# Patient Record
Sex: Female | Born: 1975
Health system: Southern US, Community
[De-identification: ages and names within clinical notes are randomized; demographics above are authoritative.]

## PROBLEM LIST (undated history)

## (undated) DIAGNOSIS — F502 Bulimia nervosa, unspecified: Secondary | ICD-10-CM

## (undated) DIAGNOSIS — F32A Depression, unspecified: Secondary | ICD-10-CM

## (undated) DIAGNOSIS — F329 Major depressive disorder, single episode, unspecified: Secondary | ICD-10-CM

---

## 2002-07-02 ENCOUNTER — Inpatient Hospital Stay (HOSPITAL_COMMUNITY): Admission: AD | Admit: 2002-07-02 | Discharge: 2002-07-02 | Payer: Self-pay | Admitting: Obstetrics and Gynecology

## 2003-01-05 ENCOUNTER — Emergency Department (HOSPITAL_COMMUNITY): Admission: EM | Admit: 2003-01-05 | Discharge: 2003-01-05 | Payer: Self-pay | Admitting: Emergency Medicine

## 2003-06-18 ENCOUNTER — Emergency Department (HOSPITAL_COMMUNITY): Admission: EM | Admit: 2003-06-18 | Discharge: 2003-06-18 | Payer: Self-pay | Admitting: Emergency Medicine

## 2003-12-10 ENCOUNTER — Emergency Department (HOSPITAL_COMMUNITY): Admission: EM | Admit: 2003-12-10 | Discharge: 2003-12-10 | Payer: Self-pay | Admitting: Emergency Medicine

## 2004-01-28 ENCOUNTER — Emergency Department (HOSPITAL_COMMUNITY): Admission: EM | Admit: 2004-01-28 | Discharge: 2004-01-28 | Payer: Self-pay | Admitting: Emergency Medicine

## 2004-07-14 ENCOUNTER — Emergency Department (HOSPITAL_COMMUNITY): Admission: EM | Admit: 2004-07-14 | Discharge: 2004-07-14 | Payer: Self-pay | Admitting: Emergency Medicine

## 2005-09-05 ENCOUNTER — Emergency Department (HOSPITAL_COMMUNITY): Admission: EM | Admit: 2005-09-05 | Discharge: 2005-09-05 | Payer: Self-pay | Admitting: Emergency Medicine

## 2006-03-27 ENCOUNTER — Emergency Department (HOSPITAL_COMMUNITY): Admission: EM | Admit: 2006-03-27 | Discharge: 2006-03-27 | Payer: Self-pay | Admitting: Family Medicine

## 2006-08-01 ENCOUNTER — Emergency Department (HOSPITAL_COMMUNITY): Admission: EM | Admit: 2006-08-01 | Discharge: 2006-08-01 | Payer: Self-pay | Admitting: Emergency Medicine

## 2007-06-27 ENCOUNTER — Emergency Department (HOSPITAL_COMMUNITY): Admission: EM | Admit: 2007-06-27 | Discharge: 2007-06-28 | Payer: Self-pay | Admitting: Emergency Medicine

## 2007-10-13 ENCOUNTER — Emergency Department (HOSPITAL_COMMUNITY): Admission: EM | Admit: 2007-10-13 | Discharge: 2007-10-13 | Payer: Self-pay | Admitting: Emergency Medicine

## 2008-01-17 ENCOUNTER — Emergency Department (HOSPITAL_COMMUNITY): Admission: EM | Admit: 2008-01-17 | Discharge: 2008-01-17 | Payer: Self-pay | Admitting: Family Medicine

## 2008-02-23 ENCOUNTER — Ambulatory Visit: Payer: Self-pay | Admitting: Internal Medicine

## 2008-02-27 ENCOUNTER — Ambulatory Visit: Payer: Self-pay | Admitting: Family Medicine

## 2008-04-06 ENCOUNTER — Emergency Department (HOSPITAL_COMMUNITY): Admission: EM | Admit: 2008-04-06 | Discharge: 2008-04-06 | Payer: Self-pay | Admitting: Emergency Medicine

## 2008-04-23 ENCOUNTER — Ambulatory Visit: Payer: Self-pay | Admitting: Internal Medicine

## 2008-05-01 ENCOUNTER — Emergency Department (HOSPITAL_COMMUNITY): Admission: EM | Admit: 2008-05-01 | Discharge: 2008-05-02 | Payer: Self-pay | Admitting: Emergency Medicine

## 2008-05-14 ENCOUNTER — Ambulatory Visit: Payer: Self-pay | Admitting: *Deleted

## 2008-08-15 ENCOUNTER — Ambulatory Visit: Payer: Self-pay | Admitting: Obstetrics & Gynecology

## 2008-09-14 ENCOUNTER — Emergency Department (HOSPITAL_COMMUNITY): Admission: EM | Admit: 2008-09-14 | Discharge: 2008-09-14 | Payer: Self-pay | Admitting: Emergency Medicine

## 2008-10-14 ENCOUNTER — Encounter (INDEPENDENT_AMBULATORY_CARE_PROVIDER_SITE_OTHER): Payer: Self-pay | Admitting: Adult Health

## 2008-10-14 ENCOUNTER — Ambulatory Visit: Payer: Self-pay | Admitting: Internal Medicine

## 2008-10-14 ENCOUNTER — Other Ambulatory Visit: Admission: RE | Admit: 2008-10-14 | Discharge: 2008-10-14 | Payer: Self-pay | Admitting: Internal Medicine

## 2008-10-14 LAB — CONVERTED CEMR LAB
AST: 55 units/L — ABNORMAL HIGH (ref 0–37)
BUN: 11 mg/dL (ref 6–23)
Basophils Relative: 0 % (ref 0–1)
Calcium: 9.1 mg/dL (ref 8.4–10.5)
Chlamydia, DNA Probe: NEGATIVE
Chloride: 106 meq/L (ref 96–112)
Creatinine, Ser: 0.75 mg/dL (ref 0.40–1.20)
GC Probe Amp, Genital: NEGATIVE
HCT: 41.8 % (ref 36.0–46.0)
Hemoglobin: 13.6 g/dL (ref 12.0–15.0)
MCHC: 32.5 g/dL (ref 30.0–36.0)
MCV: 91.1 fL (ref 78.0–100.0)
Monocytes Absolute: 0.8 10*3/uL (ref 0.1–1.0)
Monocytes Relative: 15 % — ABNORMAL HIGH (ref 3–12)
Neutro Abs: 2.9 10*3/uL (ref 1.7–7.7)
RBC: 4.59 M/uL (ref 3.87–5.11)

## 2008-10-15 ENCOUNTER — Encounter (INDEPENDENT_AMBULATORY_CARE_PROVIDER_SITE_OTHER): Payer: Self-pay | Admitting: Adult Health

## 2008-10-16 ENCOUNTER — Encounter (INDEPENDENT_AMBULATORY_CARE_PROVIDER_SITE_OTHER): Payer: Self-pay | Admitting: Adult Health

## 2008-10-16 LAB — CONVERTED CEMR LAB: HCV Ab: NEGATIVE

## 2008-12-04 ENCOUNTER — Emergency Department (HOSPITAL_COMMUNITY): Admission: EM | Admit: 2008-12-04 | Discharge: 2008-12-04 | Payer: Self-pay | Admitting: Emergency Medicine

## 2009-03-05 ENCOUNTER — Emergency Department (HOSPITAL_COMMUNITY): Admission: EM | Admit: 2009-03-05 | Discharge: 2009-03-06 | Payer: Self-pay | Admitting: Emergency Medicine

## 2009-05-06 ENCOUNTER — Emergency Department (HOSPITAL_COMMUNITY): Admission: EM | Admit: 2009-05-06 | Discharge: 2009-05-06 | Payer: Self-pay | Admitting: Emergency Medicine

## 2009-05-20 ENCOUNTER — Ambulatory Visit: Payer: Self-pay | Admitting: Internal Medicine

## 2009-07-23 ENCOUNTER — Emergency Department (HOSPITAL_COMMUNITY): Admission: EM | Admit: 2009-07-23 | Discharge: 2009-07-23 | Payer: Self-pay | Admitting: Family Medicine

## 2009-10-02 ENCOUNTER — Emergency Department (HOSPITAL_COMMUNITY): Admission: EM | Admit: 2009-10-02 | Discharge: 2009-10-02 | Payer: Self-pay | Admitting: Emergency Medicine

## 2009-11-07 ENCOUNTER — Inpatient Hospital Stay (HOSPITAL_COMMUNITY): Admission: AD | Admit: 2009-11-07 | Discharge: 2009-11-07 | Payer: Self-pay | Admitting: Obstetrics and Gynecology

## 2009-11-07 ENCOUNTER — Ambulatory Visit: Payer: Self-pay | Admitting: Advanced Practice Midwife

## 2010-01-08 ENCOUNTER — Emergency Department (HOSPITAL_COMMUNITY): Admission: EM | Admit: 2010-01-08 | Discharge: 2010-01-08 | Payer: Self-pay | Admitting: Family Medicine

## 2010-04-02 ENCOUNTER — Ambulatory Visit: Payer: Self-pay | Admitting: Advanced Practice Midwife

## 2010-04-02 ENCOUNTER — Inpatient Hospital Stay (HOSPITAL_COMMUNITY): Admission: AD | Admit: 2010-04-02 | Discharge: 2010-04-02 | Payer: Self-pay | Admitting: Obstetrics & Gynecology

## 2010-06-09 ENCOUNTER — Emergency Department (HOSPITAL_COMMUNITY): Admission: EM | Admit: 2010-06-09 | Discharge: 2010-06-09 | Payer: Self-pay | Admitting: Emergency Medicine

## 2010-09-18 ENCOUNTER — Emergency Department (HOSPITAL_COMMUNITY): Admission: EM | Admit: 2010-09-18 | Discharge: 2010-09-19 | Payer: Self-pay | Admitting: Emergency Medicine

## 2011-01-21 ENCOUNTER — Emergency Department: Payer: Self-pay | Admitting: Internal Medicine

## 2011-02-17 LAB — GC/CHLAMYDIA PROBE AMP, GENITAL
Chlamydia, DNA Probe: NEGATIVE
GC Probe Amp, Genital: NEGATIVE

## 2011-02-17 LAB — WET PREP, GENITAL

## 2011-02-18 LAB — URINALYSIS, ROUTINE W REFLEX MICROSCOPIC
Leukocytes, UA: NEGATIVE
Nitrite: NEGATIVE
Protein, ur: NEGATIVE mg/dL
Urobilinogen, UA: 1 mg/dL (ref 0.0–1.0)

## 2011-02-18 LAB — URINE MICROSCOPIC-ADD ON

## 2011-02-18 LAB — POCT PREGNANCY, URINE: Preg Test, Ur: NEGATIVE

## 2011-02-21 LAB — GC/CHLAMYDIA PROBE AMP, GENITAL
Chlamydia, DNA Probe: NEGATIVE
GC Probe Amp, Genital: NEGATIVE

## 2011-02-21 LAB — URINALYSIS, ROUTINE W REFLEX MICROSCOPIC
Bilirubin Urine: NEGATIVE
Glucose, UA: NEGATIVE mg/dL
Ketones, ur: NEGATIVE mg/dL
pH: 5.5 (ref 5.0–8.0)

## 2011-02-21 LAB — URINE MICROSCOPIC-ADD ON

## 2011-02-21 LAB — WET PREP, GENITAL
Trich, Wet Prep: NONE SEEN
WBC, Wet Prep HPF POC: NONE SEEN
Yeast Wet Prep HPF POC: NONE SEEN

## 2011-02-23 LAB — URINALYSIS, ROUTINE W REFLEX MICROSCOPIC
Glucose, UA: NEGATIVE mg/dL
Leukocytes, UA: NEGATIVE
Specific Gravity, Urine: >1.03 — ABNORMAL HIGH (ref 1.005–1.030)
Urobilinogen, UA: 0.2 mg/dL (ref 0.0–1.0)

## 2011-02-23 LAB — WET PREP, GENITAL: Yeast Wet Prep HPF POC: NONE SEEN

## 2011-02-23 LAB — URINE MICROSCOPIC-ADD ON

## 2011-02-24 LAB — WET PREP, GENITAL: Trich, Wet Prep: NONE SEEN

## 2011-02-24 LAB — POCT PREGNANCY, URINE: Preg Test, Ur: NEGATIVE

## 2011-03-09 LAB — WET PREP, GENITAL: Trich, Wet Prep: NONE SEEN

## 2011-03-09 LAB — GC/CHLAMYDIA PROBE AMP, GENITAL: GC Probe Amp, Genital: NEGATIVE

## 2011-03-09 LAB — POCT PREGNANCY, URINE: Preg Test, Ur: NEGATIVE

## 2011-03-11 LAB — WET PREP, GENITAL
Trich, Wet Prep: NONE SEEN
Yeast Wet Prep HPF POC: NONE SEEN

## 2011-03-11 LAB — GC/CHLAMYDIA PROBE AMP, GENITAL: Chlamydia, DNA Probe: NEGATIVE

## 2011-03-13 LAB — POCT URINALYSIS DIP (DEVICE)
Nitrite: NEGATIVE
Protein, ur: 30 mg/dL — AB
Urobilinogen, UA: 0.2 mg/dL (ref 0.0–1.0)
pH: 6 (ref 5.0–8.0)

## 2011-03-13 LAB — GC/CHLAMYDIA PROBE AMP, GENITAL: GC Probe Amp, Genital: NEGATIVE

## 2011-03-13 LAB — POCT PREGNANCY, URINE: Preg Test, Ur: NEGATIVE

## 2011-03-15 LAB — URINALYSIS, ROUTINE W REFLEX MICROSCOPIC
Leukocytes, UA: NEGATIVE
Nitrite: NEGATIVE
Specific Gravity, Urine: 1.03 (ref 1.005–1.030)
Urobilinogen, UA: 1 mg/dL (ref 0.0–1.0)
pH: 6 (ref 5.0–8.0)

## 2011-03-15 LAB — WET PREP, GENITAL
Trich, Wet Prep: NONE SEEN
WBC, Wet Prep HPF POC: NONE SEEN

## 2011-03-15 LAB — POCT PREGNANCY, URINE: Preg Test, Ur: NEGATIVE

## 2011-03-15 LAB — URINE MICROSCOPIC-ADD ON

## 2011-03-17 LAB — GC/CHLAMYDIA PROBE AMP, GENITAL
Chlamydia, DNA Probe: NEGATIVE
GC Probe Amp, Genital: NEGATIVE

## 2011-03-18 LAB — URINALYSIS, ROUTINE W REFLEX MICROSCOPIC
Bilirubin Urine: NEGATIVE
Ketones, ur: NEGATIVE mg/dL
Nitrite: NEGATIVE
Protein, ur: NEGATIVE mg/dL
Urobilinogen, UA: 1 mg/dL (ref 0.0–1.0)
pH: 7 (ref 5.0–8.0)

## 2011-03-18 LAB — WET PREP, GENITAL
Trich, Wet Prep: NONE SEEN
Yeast Wet Prep HPF POC: NONE SEEN

## 2011-03-18 LAB — PREGNANCY, URINE: Preg Test, Ur: NEGATIVE

## 2011-03-18 LAB — URINE MICROSCOPIC-ADD ON

## 2011-05-03 ENCOUNTER — Emergency Department (HOSPITAL_BASED_OUTPATIENT_CLINIC_OR_DEPARTMENT_OTHER)
Admission: EM | Admit: 2011-05-03 | Discharge: 2011-05-03 | Disposition: A | Discharge status: Home / Self Care | Payer: Self-pay | Attending: Emergency Medicine | Admitting: Emergency Medicine

## 2011-05-03 ENCOUNTER — Emergency Department (INDEPENDENT_AMBULATORY_CARE_PROVIDER_SITE_OTHER): Payer: Self-pay

## 2011-05-03 DIAGNOSIS — B9689 Other specified bacterial agents as the cause of diseases classified elsewhere: Secondary | ICD-10-CM | POA: Insufficient documentation

## 2011-05-03 DIAGNOSIS — R1013 Epigastric pain: Secondary | ICD-10-CM | POA: Insufficient documentation

## 2011-05-03 DIAGNOSIS — R109 Unspecified abdominal pain: Secondary | ICD-10-CM

## 2011-05-03 DIAGNOSIS — N760 Acute vaginitis: Secondary | ICD-10-CM | POA: Insufficient documentation

## 2011-05-03 DIAGNOSIS — A499 Bacterial infection, unspecified: Secondary | ICD-10-CM | POA: Insufficient documentation

## 2011-05-03 DIAGNOSIS — K2970 Gastritis, unspecified, without bleeding: Secondary | ICD-10-CM | POA: Insufficient documentation

## 2011-05-03 DIAGNOSIS — F319 Bipolar disorder, unspecified: Secondary | ICD-10-CM | POA: Insufficient documentation

## 2011-05-03 DIAGNOSIS — R1110 Vomiting, unspecified: Secondary | ICD-10-CM

## 2011-05-03 LAB — WET PREP, GENITAL
Trich, Wet Prep: NONE SEEN
Yeast Wet Prep HPF POC: NONE SEEN

## 2011-05-03 LAB — COMPREHENSIVE METABOLIC PANEL
ALT: 9 U/L (ref 0–35)
Alkaline Phosphatase: 49 U/L (ref 39–117)
CO2: 23 mEq/L (ref 19–32)
Chloride: 103 mEq/L (ref 96–112)
GFR calc non Af Amer: >60 mL/min (ref >60)
Glucose, Bld: 128 mg/dL — ABNORMAL HIGH (ref 70–99)
Potassium: 3.8 mEq/L (ref 3.5–5.1)
Sodium: 137 mEq/L (ref 135–145)

## 2011-05-03 LAB — CBC
MCH: 29.9 pg (ref 26.0–34.0)
MCV: 85.9 fL (ref 78.0–100.0)
Platelets: 333 10*3/uL (ref 150–400)
RDW: 12.1 % (ref 11.5–15.5)
WBC: 9.3 10*3/uL (ref 4.0–10.5)

## 2011-05-03 LAB — DIFFERENTIAL
Eosinophils Absolute: 0.1 10*3/uL (ref 0.0–0.7)
Eosinophils Relative: 1 % (ref 0–5)
Lymphs Abs: 1.4 10*3/uL (ref 0.7–4.0)

## 2011-05-03 LAB — URINALYSIS, ROUTINE W REFLEX MICROSCOPIC
Bilirubin Urine: NEGATIVE
Protein, ur: NEGATIVE mg/dL
Urobilinogen, UA: 0.2 mg/dL (ref 0.0–1.0)

## 2011-05-03 LAB — ETHANOL: Alcohol, Ethyl (B): 11 mg/dL — ABNORMAL HIGH (ref 0–10)

## 2011-05-03 LAB — LIPASE, BLOOD: Lipase: 18 U/L (ref 11–59)

## 2011-05-03 LAB — RAPID URINE DRUG SCREEN, HOSP PERFORMED: Benzodiazepines: NOT DETECTED

## 2011-07-10 ENCOUNTER — Inpatient Hospital Stay (INDEPENDENT_AMBULATORY_CARE_PROVIDER_SITE_OTHER)
Admission: RE | Admit: 2011-07-10 | Discharge: 2011-07-10 | Disposition: A | Discharge status: Home / Self Care | Payer: Self-pay | Source: Ambulatory Visit | Attending: Family Medicine | Admitting: Family Medicine

## 2011-07-10 DIAGNOSIS — S61209A Unspecified open wound of unspecified finger without damage to nail, initial encounter: Secondary | ICD-10-CM

## 2011-07-18 ENCOUNTER — Inpatient Hospital Stay (INDEPENDENT_AMBULATORY_CARE_PROVIDER_SITE_OTHER)
Admission: RE | Admit: 2011-07-18 | Discharge: 2011-07-18 | Disposition: A | Discharge status: Home / Self Care | Payer: Self-pay | Source: Ambulatory Visit | Attending: Family Medicine | Admitting: Family Medicine

## 2011-07-18 DIAGNOSIS — Z4802 Encounter for removal of sutures: Secondary | ICD-10-CM

## 2011-08-27 LAB — WET PREP, GENITAL: Clue Cells Wet Prep HPF POC: NONE SEEN

## 2011-08-27 LAB — POCT PREGNANCY, URINE: Operator id: 126491

## 2011-09-01 LAB — POCT I-STAT, CHEM 8
BUN: 10
Calcium, Ion: 1.24
Chloride: 103
Creatinine, Ser: 0.9
Glucose, Bld: 89
HCT: 40
Hemoglobin: 13.6
Potassium: 4
Sodium: 140
TCO2: 27

## 2011-09-01 LAB — RPR: RPR Ser Ql: NONREACTIVE

## 2011-09-01 LAB — WET PREP, GENITAL
Trich, Wet Prep: NONE SEEN
Yeast Wet Prep HPF POC: NONE SEEN

## 2011-09-01 LAB — URINALYSIS, ROUTINE W REFLEX MICROSCOPIC
Bilirubin Urine: NEGATIVE
Glucose, UA: NEGATIVE
Hgb urine dipstick: NEGATIVE
Ketones, ur: NEGATIVE
Nitrite: NEGATIVE
Protein, ur: NEGATIVE
Specific Gravity, Urine: 1.024
Urobilinogen, UA: 1
pH: 8

## 2011-09-01 LAB — GC/CHLAMYDIA PROBE AMP, GENITAL
Chlamydia, DNA Probe: NEGATIVE
GC Probe Amp, Genital: NEGATIVE

## 2011-09-01 LAB — PREGNANCY, URINE: Preg Test, Ur: NEGATIVE

## 2011-09-06 LAB — POCT URINALYSIS DIP (DEVICE)
Glucose, UA: NEGATIVE
Nitrite: NEGATIVE
Operator id: 282151
Protein, ur: NEGATIVE
Specific Gravity, Urine: >=1.03
Urobilinogen, UA: 0.2

## 2011-09-06 LAB — WET PREP, GENITAL
Trich, Wet Prep: NONE SEEN
Yeast Wet Prep HPF POC: NONE SEEN

## 2011-09-06 LAB — GC/CHLAMYDIA PROBE AMP, GENITAL
Chlamydia, DNA Probe: NEGATIVE
GC Probe Amp, Genital: NEGATIVE

## 2011-09-10 LAB — URINALYSIS, ROUTINE W REFLEX MICROSCOPIC
Leukocytes, UA: NEGATIVE
Protein, ur: NEGATIVE mg/dL
Specific Gravity, Urine: 1.022 (ref 1.005–1.030)
Urobilinogen, UA: 1 mg/dL (ref 0.0–1.0)

## 2011-09-10 LAB — URINE MICROSCOPIC-ADD ON

## 2011-09-10 LAB — WET PREP, GENITAL: Trich, Wet Prep: NONE SEEN

## 2011-09-16 ENCOUNTER — Inpatient Hospital Stay (INDEPENDENT_AMBULATORY_CARE_PROVIDER_SITE_OTHER)
Admission: RE | Admit: 2011-09-16 | Discharge: 2011-09-16 | Disposition: A | Discharge status: Home / Self Care | Payer: Self-pay | Source: Ambulatory Visit | Attending: Emergency Medicine | Admitting: Emergency Medicine

## 2011-09-16 DIAGNOSIS — N760 Acute vaginitis: Secondary | ICD-10-CM

## 2011-09-16 DIAGNOSIS — A499 Bacterial infection, unspecified: Secondary | ICD-10-CM

## 2011-09-16 LAB — WET PREP, GENITAL
Trich, Wet Prep: NONE SEEN
Yeast Wet Prep HPF POC: NONE SEEN

## 2011-09-16 LAB — POCT URINALYSIS DIP (DEVICE)
Bilirubin Urine: NEGATIVE
Glucose, UA: NEGATIVE mg/dL
Ketones, ur: NEGATIVE mg/dL
Leukocytes, UA: NEGATIVE
Nitrite: NEGATIVE

## 2011-09-16 LAB — POCT PREGNANCY, URINE: Preg Test, Ur: NEGATIVE

## 2011-09-17 LAB — GC/CHLAMYDIA PROBE AMP, GENITAL: Chlamydia, DNA Probe: NEGATIVE

## 2011-09-20 LAB — WET PREP, GENITAL
Clue Cells Wet Prep HPF POC: NONE SEEN
Trich, Wet Prep: NONE SEEN
Yeast Wet Prep HPF POC: NONE SEEN

## 2011-09-20 LAB — URINALYSIS, ROUTINE W REFLEX MICROSCOPIC
Bilirubin Urine: NEGATIVE
Glucose, UA: NEGATIVE
Hgb urine dipstick: NEGATIVE
Specific Gravity, Urine: 1.025
Urobilinogen, UA: 1
pH: 7

## 2011-09-20 LAB — GC/CHLAMYDIA PROBE AMP, GENITAL: GC Probe Amp, Genital: NEGATIVE

## 2011-12-19 ENCOUNTER — Encounter (HOSPITAL_COMMUNITY): Payer: Self-pay | Admitting: Emergency Medicine

## 2011-12-19 ENCOUNTER — Emergency Department (HOSPITAL_COMMUNITY)
Admission: EM | Admit: 2011-12-19 | Discharge: 2011-12-20 | Disposition: A | Discharge status: Home / Self Care | Payer: Self-pay | Attending: Emergency Medicine | Admitting: Emergency Medicine

## 2011-12-19 DIAGNOSIS — F3289 Other specified depressive episodes: Secondary | ICD-10-CM | POA: Insufficient documentation

## 2011-12-19 DIAGNOSIS — F502 Bulimia nervosa, unspecified: Secondary | ICD-10-CM | POA: Insufficient documentation

## 2011-12-19 DIAGNOSIS — F329 Major depressive disorder, single episode, unspecified: Secondary | ICD-10-CM

## 2011-12-19 LAB — POCT I-STAT, CHEM 8
Calcium, Ion: 1.26 mmol/L (ref 1.12–1.32)
Chloride: 107 mEq/L (ref 96–112)
Glucose, Bld: 83 mg/dL (ref 70–99)
HCT: 41 % (ref 36.0–46.0)
TCO2: 26 mmol/L (ref 0–100)

## 2011-12-19 NOTE — ED Notes (Signed)
C/o depression over the past "few months."  Pt reports she is having personal problems.  Denies suicidal ideation.

## 2011-12-19 NOTE — ED Notes (Signed)
Pt presents to department for evaluation of depression. States that she has felt very sad and upset lately. States she has a drug/alcohol problem, uses cocaine, marijuana and drinks heavily. She has currently been having trouble keeping a job and paying bills. Denies SI/HI at the time. Pt states she is very stressed out and wants to get help getting her life together. Pt is alert and oriented x4. Last drug/alcohol use today. Denies pain. No signs of distress noted at present.

## 2011-12-19 NOTE — ED Provider Notes (Signed)
Patient depressed for several months over lack of job and lack of money she has been practicing bulimia for several years she vehemently denies suicidal ideation or intention to harm others  Doug Sou, MD 12/19/11 2052

## 2011-12-19 NOTE — ED Provider Notes (Signed)
History     CSN: [id number]  Arrival date & time 12/19/11  1735   First MD Initiated Contact with Patient 12/19/11 1943      Chief Complaint  Patient presents with  . Depression    (Consider location/radiation/quality/duration/timing/severity/associated sxs/prior treatment) HPI history from patient Patient is a 36 year old female with history of depression and bulimia who presents with depression. Patient states she has had depression for years but has had increasing depression over the last 5 months. She is sad but denies sleep issues, anhedonia, difficulty concentrating. She is mostly frustrated regarding her difficulty finding a stable job. She also has some additional social and family stressors. She has had a few episodes of binging and purging over the last 5 months but overall is eating and drinking well. Patient adamantly denies suicidal ideation/attempt, homicidal ideation/attempt, and hallucinations. She has never attempted to hurt himself before. She is here seeking help with resources for therapy as well as education and vocational training. Overall severity is mild. No modifying factors noted.  History reviewed. No pertinent past medical history.  History reviewed. No pertinent past surgical history.  History reviewed. No pertinent family history.  History  Substance Use Topics  . Smoking status: Current Everyday Smoker  . Smokeless tobacco: Not on file  . Alcohol Use: Yes    OB History    Grav Para Term Preterm Abortions TAB SAB Ect Mult Living                  Review of Systems  Constitutional: Negative for fever and chills.  HENT: Negative for facial swelling.   Eyes: Negative for visual disturbance.  Respiratory: Negative for cough, chest tightness, shortness of breath and wheezing.   Cardiovascular: Negative for chest pain.  Gastrointestinal: Negative for nausea, vomiting, abdominal pain and diarrhea.  Genitourinary: Negative for difficulty urinating.    Skin: Negative for rash.  Neurological: Negative for weakness and numbness.  Psychiatric/Behavioral: Negative for behavioral problems and confusion.  All other systems reviewed and are negative.    Allergies  Tetracyclines & related  Home Medications   Current Outpatient Rx  Name Route Sig Dispense Refill  . TERBINAFINE HCL 1 % EX CREA Topical Apply 1 application topically 2 (two) times daily. For dryness      BP 93/68  Pulse 70  Temp(Src) 98.8 F (37.1 C) (Oral)  Resp 16  Ht 5' 4.5" (1.638 m)  Wt 148 lb (67.132 kg)  BMI 25.01 kg/m2  SpO2 98%  LMP 11/28/2011  Physical Exam  Nursing note and vitals reviewed. Constitutional: She is oriented to person, place, and time. She appears well-developed and well-nourished. No distress.  HENT:  Head: Normocephalic.  Nose: Nose normal.  Eyes: EOM are normal.  Neck: Normal range of motion. Neck supple.  Cardiovascular: Normal rate, regular rhythm and intact distal pulses.   Pulmonary/Chest: Effort normal and breath sounds normal. No respiratory distress.  Abdominal: Soft. She exhibits no distension.  Musculoskeletal: Normal range of motion. She exhibits no edema and no tenderness.  Neurological: She is alert and oriented to person, place, and time.       Normal strength  Skin: Skin is warm and dry. No rash noted. She is not diaphoretic.  Psychiatric: She has a normal mood and affect. Her behavior is normal. Thought content normal.       Mood and affect are normal. Patient has appropriate conversation. She denies SI/HI and hallucinations    ED Course  Procedures (including critical care time)  Labs Reviewed  POCT I-STAT, CHEM 8  I-STAT, CHEM 8   No results found.   1. Depression       MDM   With reported history of bulimia, metabolic panel ordered and is unremarkable. Patient is here seeking assistance with resources and therapy. She has good insight and is being proactive. She has not had suicidal ideation or  hallucinations. She is not a harm herself or others. She is acting appropriately. I discussed her situation with social work as well as the ACT team. We were able to compile local resources for assistance with education as well as therapy. Patient amenable to plan and return precautions discussed.        Milus Glazier 12/20/11 0121

## 2011-12-20 NOTE — ED Provider Notes (Signed)
I have personally seen and examined the patient.  I have discussed the plan of care with the resident.  I have reviewed the documentation on PMH/FH/Soc. History.  I have reviewed the documentation of the resident and agree.  Edward Trevino, MD 12/20/11 1401 

## 2012-01-07 ENCOUNTER — Emergency Department (HOSPITAL_BASED_OUTPATIENT_CLINIC_OR_DEPARTMENT_OTHER)
Admission: EM | Admit: 2012-01-07 | Discharge: 2012-01-07 | Disposition: A | Discharge status: Home / Self Care | Payer: Self-pay | Attending: Emergency Medicine | Admitting: Emergency Medicine

## 2012-01-07 ENCOUNTER — Encounter (HOSPITAL_BASED_OUTPATIENT_CLINIC_OR_DEPARTMENT_OTHER): Payer: Self-pay | Admitting: Emergency Medicine

## 2012-01-07 DIAGNOSIS — L600 Ingrowing nail: Secondary | ICD-10-CM | POA: Insufficient documentation

## 2012-01-07 DIAGNOSIS — M79609 Pain in unspecified limb: Secondary | ICD-10-CM | POA: Insufficient documentation

## 2012-01-07 DIAGNOSIS — M7989 Other specified soft tissue disorders: Secondary | ICD-10-CM | POA: Insufficient documentation

## 2012-01-07 DIAGNOSIS — F329 Major depressive disorder, single episode, unspecified: Secondary | ICD-10-CM | POA: Insufficient documentation

## 2012-01-07 DIAGNOSIS — F17200 Nicotine dependence, unspecified, uncomplicated: Secondary | ICD-10-CM | POA: Insufficient documentation

## 2012-01-07 DIAGNOSIS — F3289 Other specified depressive episodes: Secondary | ICD-10-CM | POA: Insufficient documentation

## 2012-01-07 HISTORY — DX: Bulimia nervosa, unspecified: F50.20

## 2012-01-07 HISTORY — DX: Bulimia nervosa: F50.2

## 2012-01-07 HISTORY — DX: Depression, unspecified: F32.A

## 2012-01-07 HISTORY — DX: Major depressive disorder, single episode, unspecified: F32.9

## 2012-01-07 MED ORDER — BUPIVACAINE HCL 0.5 % IJ SOLN
INTRAMUSCULAR | Status: AC
  Filled 2012-01-07: qty 1

## 2012-01-07 MED ORDER — BUPIVACAINE HCL 0.5 % IJ SOLN
50.0000 mL | Freq: Once | INTRAMUSCULAR | Status: AC
  Administered 2012-01-07: 5 mL

## 2012-01-07 NOTE — ED Provider Notes (Signed)
History     CSN: [id number]  Arrival date & time 01/07/12  0343   First MD Initiated Contact with Patient 01/07/12 [phone]      Chief Complaint  Patient presents with  . Toe Pain     The history is provided by the patient.   the patient reports pain and swelling to her right great toe for several days.  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  She reports she feels as though she may have cut her nail too short and believes she has an ingrown toenail on the right great toe.  No fever or chills.  No spreading erythema.  She reports that pain is moderate to severe.  It is worsened by palpation and movement.  It is improved by nothing.  She has no significant past medical history.  She is nondiabetic.  Past Medical History  Diagnosis Date  . Depression   . Bulimia     History reviewed. No pertinent past surgical history.  No family history on file.  History  Substance Use Topics  . Smoking status: Current Some Day Smoker  . Smokeless tobacco: Not on file  . Alcohol Use: Yes    OB History    Grav Para Term Preterm Abortions TAB SAB Ect Mult Living                  Review of Systems  All other systems reviewed and are negative.    Allergies  Tetracyclines & related  Home Medications   Current Outpatient Rx  Name Route Sig Dispense Refill  . TERBINAFINE HCL 1 % EX CREA Topical Apply 1 application topically 2 (two) times daily. For dryness      BP 112/72  Pulse 78  Temp(Src) 98.2 F (36.8 C) (Oral)  Resp 18  SpO2 99%  LMP 11/28/2011  Physical Exam  Constitutional: She is oriented to person, place, and time. She appears well-developed and well-nourished.  HENT:  Head: Normocephalic.  Eyes: EOM are normal.  Neck: Normal range of motion.  Pulmonary/Chest: Effort normal.  Musculoskeletal: Normal range of motion.       Ingrown toenail on right great toe on medial aspect.  No drainage or pus from the  wound.  There is no swelling or spreading erythema  Neurological: She is alert and oriented to person, place, and time.  Psychiatric: She has a normal mood and affect.    ED Course  NERVE BLOCK Performed by: Lyanne Co Authorized by: Lyanne Co Risks and benefits: risks, benefits and alternatives were discussed Consent given by: patient Required items: required blood products, implants, devices, and special equipment available Patient identity confirmed: verbally with patient Time out: Immediately prior to procedure a "time out" was called to verify the correct patient, procedure, equipment, support staff and site/side marked as required. Indications comments: Toenail procedure.  Nail excision Nerve block body site: Digital nerves of right great toe. Preparation: Patient was prepped and draped in the usual sterile fashion. Needle gauge: 24 G Location technique: anatomical landmarks Local anesthetic: bupivacaine 0.5% without epinephrine Anesthetic total: 4 ml Outcome: pain improved Patient tolerance: Patient tolerated the procedure well with no immediate complications.  NAIL REMOVAL Performed by: Lyanne Co Authorized by: Lyanne Co Consent: Verbal consent obtained. Risks and benefits: risks, benefits and alternatives were discussed Consent given by: patient Required items: required blood products, implants, devices, and special equipment available Patient identity  confirmed: verbally with patient Time out: Immediately prior to procedure a "time out" was called to verify the correct patient, procedure, equipment, support staff and site/side marked as required. Location: right foot Location details: right big toe Anesthesia: nerve block Preparation: skin prepped with Betadine Amount removed: 1/5 Nail removed location: Medial. Dressing: antibiotic ointment and dressing applied Patient tolerance: Patient tolerated the procedure well with no immediate  complications.   (including critical care time)  Labs Reviewed - No data to display No results found.   1. Ingrown toenail       MDM  Toenail procedure performed.  Partial nail excision performed.  Infection warnings given.  Instructed in warm dial soap soaks       Lyanne Co, MD 01/07/12 [phone]

## 2012-01-07 NOTE — ED Notes (Signed)
Pt c/o right great toe pain and swelling after giving self a pedicure

## 2012-02-18 ENCOUNTER — Emergency Department (HOSPITAL_COMMUNITY)
Admission: EM | Admit: 2012-02-18 | Discharge: 2012-02-18 | Disposition: A | Discharge status: Home / Self Care | Payer: Self-pay | Attending: Emergency Medicine | Admitting: Emergency Medicine

## 2012-02-18 ENCOUNTER — Encounter (HOSPITAL_COMMUNITY): Payer: Self-pay | Admitting: Emergency Medicine

## 2012-02-18 DIAGNOSIS — F17200 Nicotine dependence, unspecified, uncomplicated: Secondary | ICD-10-CM | POA: Insufficient documentation

## 2012-02-18 DIAGNOSIS — N6019 Diffuse cystic mastopathy of unspecified breast: Secondary | ICD-10-CM | POA: Insufficient documentation

## 2012-02-18 DIAGNOSIS — N949 Unspecified condition associated with female genital organs and menstrual cycle: Secondary | ICD-10-CM | POA: Insufficient documentation

## 2012-02-18 DIAGNOSIS — L293 Anogenital pruritus, unspecified: Secondary | ICD-10-CM | POA: Insufficient documentation

## 2012-02-18 DIAGNOSIS — N898 Other specified noninflammatory disorders of vagina: Secondary | ICD-10-CM

## 2012-02-18 LAB — URINALYSIS, ROUTINE W REFLEX MICROSCOPIC
Bilirubin Urine: NEGATIVE
Glucose, UA: NEGATIVE mg/dL
Ketones, ur: NEGATIVE mg/dL
Nitrite: NEGATIVE
Protein, ur: NEGATIVE mg/dL
pH: 6 (ref 5.0–8.0)

## 2012-02-18 LAB — URINE MICROSCOPIC-ADD ON

## 2012-02-18 LAB — WET PREP, GENITAL
Trich, Wet Prep: NONE SEEN
Yeast Wet Prep HPF POC: NONE SEEN

## 2012-02-18 MED ORDER — METRONIDAZOLE 500 MG PO TABS: 500.0000 mg | ORAL_TABLET | Freq: Two times a day (BID) | ORAL | Status: AC

## 2012-02-18 NOTE — ED Notes (Signed)
Patient moved to CDU for pelvic exam

## 2012-02-18 NOTE — Discharge Instructions (Signed)
Call the on-call gynecologist for a checkup.  Return here for problems as needed.

## 2012-02-18 NOTE — ED Notes (Signed)
Patient now back to original room

## 2012-02-18 NOTE — ED Provider Notes (Addendum)
History     CSN: [id number]  Arrival date & time 02/18/12  1132   First MD Initiated Contact with Patient 02/18/12 1318      Chief Complaint  Patient presents with  . Vaginal Itching    (Consider location/radiation/quality/duration/timing/severity/associated sxs/prior treatment) Patient is a 36 y.o. female presenting with vaginal itching. The history is provided by the patient.  Vaginal Itching The current episode started more than 1 week ago. The problem occurs constantly. The problem has not changed since onset.Pertinent negatives include no chest pain and no abdominal pain. Associated symptoms comments: Her left breast has been swollen for 2 months. No nipple discharge is associated. She has not palpated any new masses. The symptoms are aggravated by nothing. The symptoms are relieved by nothing. Treatments tried: She tried using Lamisil without relief. The treatment provided no relief.   Her Dr. told her that she had fibrocystic breast disease. Her vaginal itching is associated with a mild vaginal discharge, but no dysparunia. She denies vomiting, diarrhea, abdominal pain or back pain.    Past Medical History  Diagnosis Date  . Depression   . Bulimia     History reviewed. No pertinent past surgical history.  No family history on file.  History  Substance Use Topics  . Smoking status: Current Some Day Smoker  . Smokeless tobacco: Not on file  . Alcohol Use: Yes    OB History    Grav Para Term Preterm Abortions TAB SAB Ect Mult Living                  Review of Systems  Cardiovascular: Negative for chest pain.  Gastrointestinal: Negative for abdominal pain.  All other systems reviewed and are negative.    Allergies  Tetracyclines & related  Home Medications   Current Outpatient Rx  Name Route Sig Dispense Refill  . ACETAMINOPHEN 500 MG PO TABS Oral Take 500 mg by mouth every 6 (six) hours as needed. For pain    . TERBINAFINE HCL 1 % EX CREA Topical Apply  1 application topically 2 (two) times daily as needed. For dryness      BP 95/65  Pulse 67  Temp(Src) 98 F (36.7 C) (Oral)  Resp 14  SpO2 100%  Physical Exam  Nursing note and vitals reviewed. Constitutional: She is oriented to person, place, and time. She appears well-developed and well-nourished.  HENT:  Head: Normocephalic and atraumatic.  Eyes: Conjunctivae and EOM are normal. Pupils are equal, round, and reactive to light.  Neck: Normal range of motion and phonation normal. Neck supple.  Cardiovascular: Normal rate, regular rhythm and intact distal pulses.   Pulmonary/Chest: Effort normal and breath sounds normal. She exhibits no tenderness.       The breast are pendulous bilaterally. There is no substantial volume difference. She has scattered small nodules consistent with with fibrocystic breast disease. The nipples are normal. There is no associated axillary adenopathy  Abdominal: Soft. She exhibits no distension. There is no tenderness. There is no guarding.  Genitourinary:       Normal external female genitalia. Mild, translucent, vaginal discharge. The cervix is in the left lateral vaginal wall. On bimanual examination:  She has left Adenexal tenderness (mild), without mass. The uterus is palpated to the left of the midline; it has normal size, and nontender.  Musculoskeletal: Normal range of motion.  Neurological: She is alert and oriented to person, place, and time. She has normal strength. She exhibits normal muscle tone.  Skin:  Skin is warm and dry.  Psychiatric: She has a normal mood and affect. Her behavior is normal. Judgment and thought content normal.    ED Course  Procedures (including critical care time)   3:15 PM Reevaluation with update and discussion. After initial assessment and treatment, an updated evaluation reveals no further c/o. Jarrid Lienhard L    Labs Reviewed  URINALYSIS, ROUTINE W REFLEX MICROSCOPIC - Abnormal; Notable for the following:     APPearance CLOUDY (*)    Hgb urine dipstick SMALL (*)    All other components within normal limits  URINE MICROSCOPIC-ADD ON - Abnormal; Notable for the following:    Squamous Epithelial / LPF MANY (*)    Bacteria, UA FEW (*)    All other components within normal limits  WET PREP, GENITAL - Abnormal; Notable for the following:    WBC, Wet Prep HPF POC FEW (*)    All other components within normal limits  POCT PREGNANCY, URINE  GC/CHLAMYDIA PROBE AMP, GENITAL   No results found.   1. Vaginal odor       MDM  She has a benign breast examination. I doubt associated tumors, infection, or structural abnormality. The pelvic examination is abnormal- but ovarian and uterine abnormalities are not suspected. She will likely need assessment by a gynecologist do to the abnormally placed. The cervix and uterus, to ensure no occult process. The vaginal odor she is having has no obvious source.  Plan: Home Medications- none; Home Treatments- none; Recommended follow up- f/u GYN prn        Flint Melter, MD 02/18/12 1515   After d/c, pt decided that she wanted a prescription for Flagyl. I agreed to write it.  Flint Melter, MD 02/18/12 [phone]

## 2012-02-18 NOTE — ED Notes (Signed)
Patient presents with vaginal itching x several weeks with mild discharge.  Patient reports she was treated for similar symptoms a few weeks ago, lost her medication that was prescribed (metronidazole), therefore her infection has not cleared.  Patient resting quietly, denies abdominal pain at this time.

## 2012-02-18 NOTE — ED Notes (Signed)
Stats has had avg itching and a vag d/c x  2 weeks

## 2012-06-30 ENCOUNTER — Emergency Department (HOSPITAL_COMMUNITY)
Admission: EM | Admit: 2012-06-30 | Discharge: 2012-06-30 | Disposition: A | Discharge status: Home / Self Care | Payer: Self-pay | Attending: Emergency Medicine | Admitting: Emergency Medicine

## 2012-06-30 ENCOUNTER — Encounter (HOSPITAL_COMMUNITY): Payer: Self-pay | Admitting: Emergency Medicine

## 2012-06-30 DIAGNOSIS — K089 Disorder of teeth and supporting structures, unspecified: Secondary | ICD-10-CM | POA: Insufficient documentation

## 2012-06-30 DIAGNOSIS — R632 Polyphagia: Secondary | ICD-10-CM | POA: Insufficient documentation

## 2012-06-30 DIAGNOSIS — F329 Major depressive disorder, single episode, unspecified: Secondary | ICD-10-CM | POA: Insufficient documentation

## 2012-06-30 DIAGNOSIS — F17200 Nicotine dependence, unspecified, uncomplicated: Secondary | ICD-10-CM | POA: Insufficient documentation

## 2012-06-30 DIAGNOSIS — F3289 Other specified depressive episodes: Secondary | ICD-10-CM | POA: Insufficient documentation

## 2012-06-30 DIAGNOSIS — K0889 Other specified disorders of teeth and supporting structures: Secondary | ICD-10-CM

## 2012-06-30 MED ORDER — PENICILLIN V POTASSIUM 500 MG PO TABS: 500.0000 mg | ORAL_TABLET | Freq: Four times a day (QID) | ORAL | Status: AC

## 2012-06-30 MED ORDER — HYDROCODONE-ACETAMINOPHEN 5-500 MG PO TABS: 1.0000 | ORAL_TABLET | Freq: Four times a day (QID) | ORAL | Status: AC | PRN

## 2012-06-30 NOTE — ED Provider Notes (Signed)
History     CSN: [id number]  Arrival date & time 06/30/12  0746   First MD Initiated Contact with Patient 06/30/12 [phone]      Chief Complaint  Patient presents with  . Dental Pain     The history is provided by the patient.   the patient reports approximately 3 weeks of left upper dental pain.  The last several days she's developed pain in her left lower first molars well.  She denies difficulty breathing or swallowing.  She has no fevers or chills.  She denies facial swelling.  She has no new lymphadenopathy.  Her pain is worsened by palpation of her tooth.  Nothing improves her pain.  She has not called her dentist for followup  Past Medical History  Diagnosis Date  . Depression   . Bulimia     History reviewed. No pertinent past surgical history.  No family history on file.  History  Substance Use Topics  . Smoking status: Current Some Day Smoker  . Smokeless tobacco: Not on file  . Alcohol Use: Yes    OB History    Grav Para Term Preterm Abortions TAB SAB Ect Mult Living                  Review of Systems  All other systems reviewed and are negative.    Allergies  Tetracyclines & related  Home Medications   Current Outpatient Rx  Name Route Sig Dispense Refill  . METRONIDAZOLE 500 MG PO TABS Oral Take 1,000 mg by mouth 2 (two) times daily.    Marland Kitchen OVER THE COUNTER MEDICATION Oral Take 1 tablet by mouth at bedtime. OTC sleeping pill    . TERBINAFINE HCL 1 % EX CREA Topical Apply 1 application topically 2 (two) times daily as needed. For dryness    . HYDROCODONE-ACETAMINOPHEN 5-500 MG PO TABS Oral Take 1-2 tablets by mouth every 6 (six) hours as needed for pain. 15 tablet 0  . PENICILLIN V POTASSIUM 500 MG PO TABS Oral Take 1 tablet (500 mg total) by mouth 4 (four) times daily. 28 tablet 0    BP 103/73  Pulse 75  Temp 98.7 F (37.1 C) (Oral)  Resp 20  SpO2 96%  LMP 06/16/2012  Physical Exam  Nursing note and vitals reviewed. Constitutional: She is  oriented to person, place, and time. She appears well-developed and well-nourished. No distress.  HENT:  Head: Normocephalic and atraumatic.       Patient with evidence of dental decay at her left upper second molar as well as her left lower first molar.  No gingival swelling.  No gingival fluctuance.  Tolerating secretions  Eyes: EOM are normal.  Neck: Normal range of motion.  Cardiovascular: Normal heart sounds.   Pulmonary/Chest: Effort normal.  Abdominal: Soft.  Musculoskeletal: Normal range of motion.  Neurological: She is alert and oriented to person, place, and time.  Skin: Skin is warm and dry.  Psychiatric: She has a normal mood and affect. Judgment normal.    ED Course  Procedures (including critical care time)  Labs Reviewed - No data to display No results found.   1. Pain, dental       MDM  Dental Pain. Home with antibiotics and pain medicine. Recommend dental follow up. No signs of gingival abscess. Tolerating secretions. Airway patent. No sub lingular swelling         Lyanne Co, MD 06/30/12 [phone]

## 2012-06-30 NOTE — ED Notes (Addendum)
Pt c/o left upper toothache x 2-3 weeks. Pt reports cannot afford to go to the dentist. Pt tried Orajel over the counter without relief.

## 2012-11-12 ENCOUNTER — Encounter (HOSPITAL_BASED_OUTPATIENT_CLINIC_OR_DEPARTMENT_OTHER): Payer: Self-pay | Admitting: *Deleted

## 2012-11-12 ENCOUNTER — Emergency Department (HOSPITAL_BASED_OUTPATIENT_CLINIC_OR_DEPARTMENT_OTHER)
Admission: EM | Admit: 2012-11-12 | Discharge: 2012-11-12 | Disposition: A | Discharge status: Home / Self Care | Payer: Self-pay | Attending: Emergency Medicine | Admitting: Emergency Medicine

## 2012-11-12 DIAGNOSIS — L600 Ingrowing nail: Secondary | ICD-10-CM | POA: Insufficient documentation

## 2012-11-12 DIAGNOSIS — Z8659 Personal history of other mental and behavioral disorders: Secondary | ICD-10-CM | POA: Insufficient documentation

## 2012-11-12 DIAGNOSIS — Z87891 Personal history of nicotine dependence: Secondary | ICD-10-CM | POA: Insufficient documentation

## 2012-11-12 DIAGNOSIS — Z79899 Other long term (current) drug therapy: Secondary | ICD-10-CM | POA: Insufficient documentation

## 2012-11-12 DIAGNOSIS — Z87828 Personal history of other (healed) physical injury and trauma: Secondary | ICD-10-CM | POA: Insufficient documentation

## 2012-11-12 MED ORDER — LIDOCAINE HCL 2 % IJ SOLN
INTRAMUSCULAR | Status: AC
  Administered 2012-11-12: 400 mg
  Filled 2012-11-12: qty 20

## 2012-11-12 MED ORDER — IBUPROFEN 400 MG PO TABS: 400.0000 mg | ORAL_TABLET | Freq: Four times a day (QID) | ORAL | Status: DC | PRN

## 2012-11-12 NOTE — ED Notes (Signed)
In grown toenail has been soaking is red and painful

## 2012-11-12 NOTE — ED Provider Notes (Signed)
History  This chart was scribed for Tanya Racer, MD by Bennett Scrape, ED Scribe. This patient was seen in room MH07/[facility]07 and the patient's care was started at 4:42 PM.  CSN: [id number]  Arrival date & time 11/12/12  1442   First MD Initiated Contact with Patient 11/12/12 1642      Chief Complaint  Patient presents with  . Toe Pain    Patient is a 36 y.o. female presenting with toe pain. The history is provided by the patient. No language interpreter was used.  Toe Pain This is a new problem. The current episode started more than 1 week ago. The problem occurs constantly. The problem has been gradually worsening. Pertinent negatives include no chest pain, no abdominal pain, no headaches and no shortness of breath. The symptoms are aggravated by walking. Nothing relieves the symptoms.    Tanya Kirk is a 36 y.o. female who presents to the Emergency Department complaining of 3 weeks of gradual onset, gradually worsening, constant in grown toe nail in the right big toe with associated redness. The pain is worse with walking and she states that she has been soaking her toe every day with mild improvement. She reports one prior episode of an in grown toe nail with similar symptoms and states that she dropped a large object on her toe years ago which makes the nail grow sideways. She denies fevers, chills, nausea and emesis as associated symptoms. She has a h/o depression and bulimia. Pt is an occasional alcohol user and is a former smoker.   Past Medical History  Diagnosis Date  . Depression   . Bulimia     History reviewed. No pertinent past surgical history.  History reviewed. No pertinent family history.  History  Substance Use Topics  . Smoking status: Former Games developer  . Smokeless tobacco: Not on file  . Alcohol Use: Yes     Comment: occ    No OB history provided.  Review of Systems  Constitutional: Negative for fever and chills.  Respiratory: Negative for shortness of  breath.   Cardiovascular: Negative for chest pain.  Gastrointestinal: Negative for nausea, vomiting and abdominal pain.  Musculoskeletal:       Positive for right great toe pain  Neurological: Negative for headaches.  All other systems reviewed and are negative.    Allergies  Tetracyclines & related  Home Medications   Current Outpatient Rx  Name  Route  Sig  Dispense  Refill  . IBUPROFEN 400 MG PO TABS   Oral   Take 1 tablet (400 mg total) by mouth every 6 (six) hours as needed for pain.   30 tablet   0   . METRONIDAZOLE 500 MG PO TABS   Oral   Take 1,000 mg by mouth 2 (two) times daily.         Marland Kitchen OVER THE COUNTER MEDICATION   Oral   Take 1 tablet by mouth at bedtime. OTC sleeping pill         . TERBINAFINE HCL 1 % EX CREA   Topical   Apply 1 application topically 2 (two) times daily as needed. For dryness           Triage Vitals: BP 100/67  Pulse 73  Temp 98.3 F (36.8 C) (Oral)  Resp 20  SpO2 100%  LMP 11/11/2012  Physical Exam  Nursing note and vitals reviewed. Constitutional: She is oriented to person, place, and time. She appears well-developed and well-nourished. No distress.  HENT:  Head: Normocephalic and atraumatic.  Eyes: EOM are normal.  Neck: Neck supple. No tracheal deviation present.  Cardiovascular: Normal rate.   Pulmonary/Chest: Effort normal. No respiratory distress.  Musculoskeletal: Normal range of motion.       Mild erythema to the right great toe along the medial border of the free edge  Neurological: She is alert and oriented to person, place, and time.  Skin: Skin is warm and dry.  Psychiatric: She has a normal mood and affect. Her behavior is normal.    ED Course  Procedures (including critical care time)  DIAGNOSTIC STUDIES: Oxygen Saturation is 100% on room air, normal by my interpretation.    COORDINATION OF CARE: 4:50 PM- Discussed treatment plan which includes cutting the toe nail with pt at bedside and pt agreed  to plan. Advised pt on toe nail cutting techniques and that she should follow up with a podiatrist.  5:17 PM- Prepped pt's right great toe with an alcohol wipe. Injected 2% lidocaine <1 mL into the right great toe, made incision on the distal lateral great toe nail, no bleeding, pt tolerated well  Labs Reviewed - No data to display No results found.   1. Ingrown nail       MDM  I personally performed the services described in this documentation, which was scribed in my presence. The recorded information has been reviewed and is accurate.     Tanya Racer, MD 11/12/12 [phone]

## 2013-01-12 IMAGING — CR DG ABDOMEN ACUTE W/ 1V CHEST
3 series · 3 of 3 positions shown · non-contrast
Comparison: None.

CLINICAL DATA: Abdominal pain, vomiting

ACUTE ABDOMEN SERIES (ABDOMEN 2 VIEW & CHEST 1 VIEW)

[w chest pa]
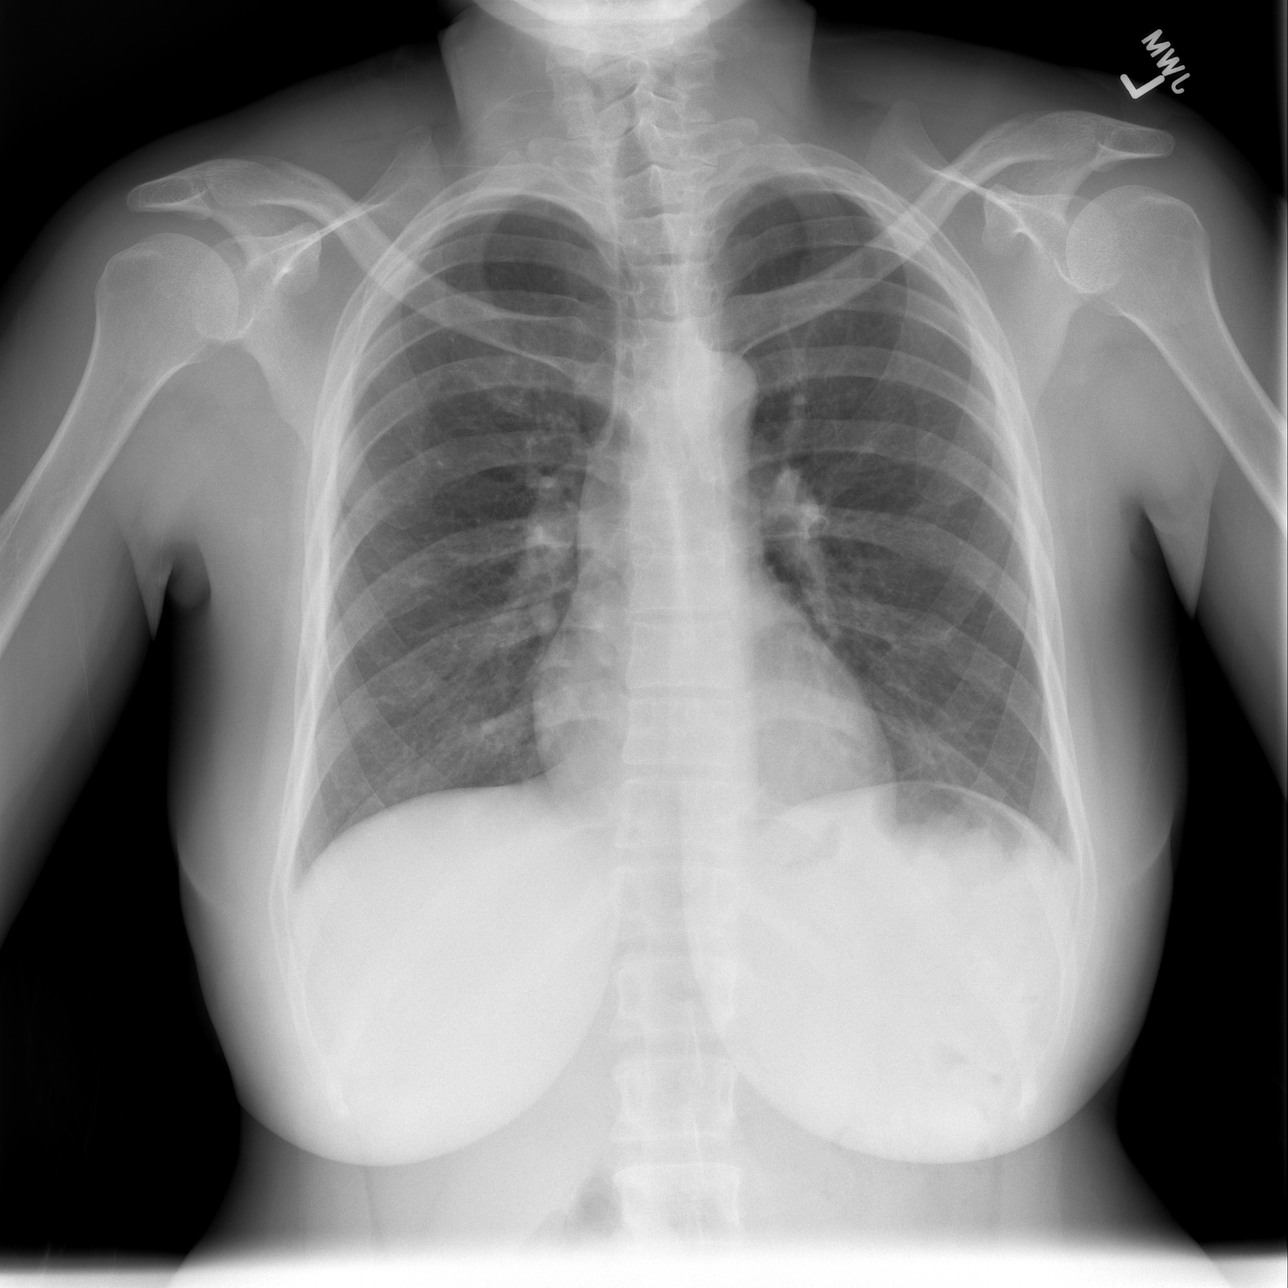

[w abdomen upright]
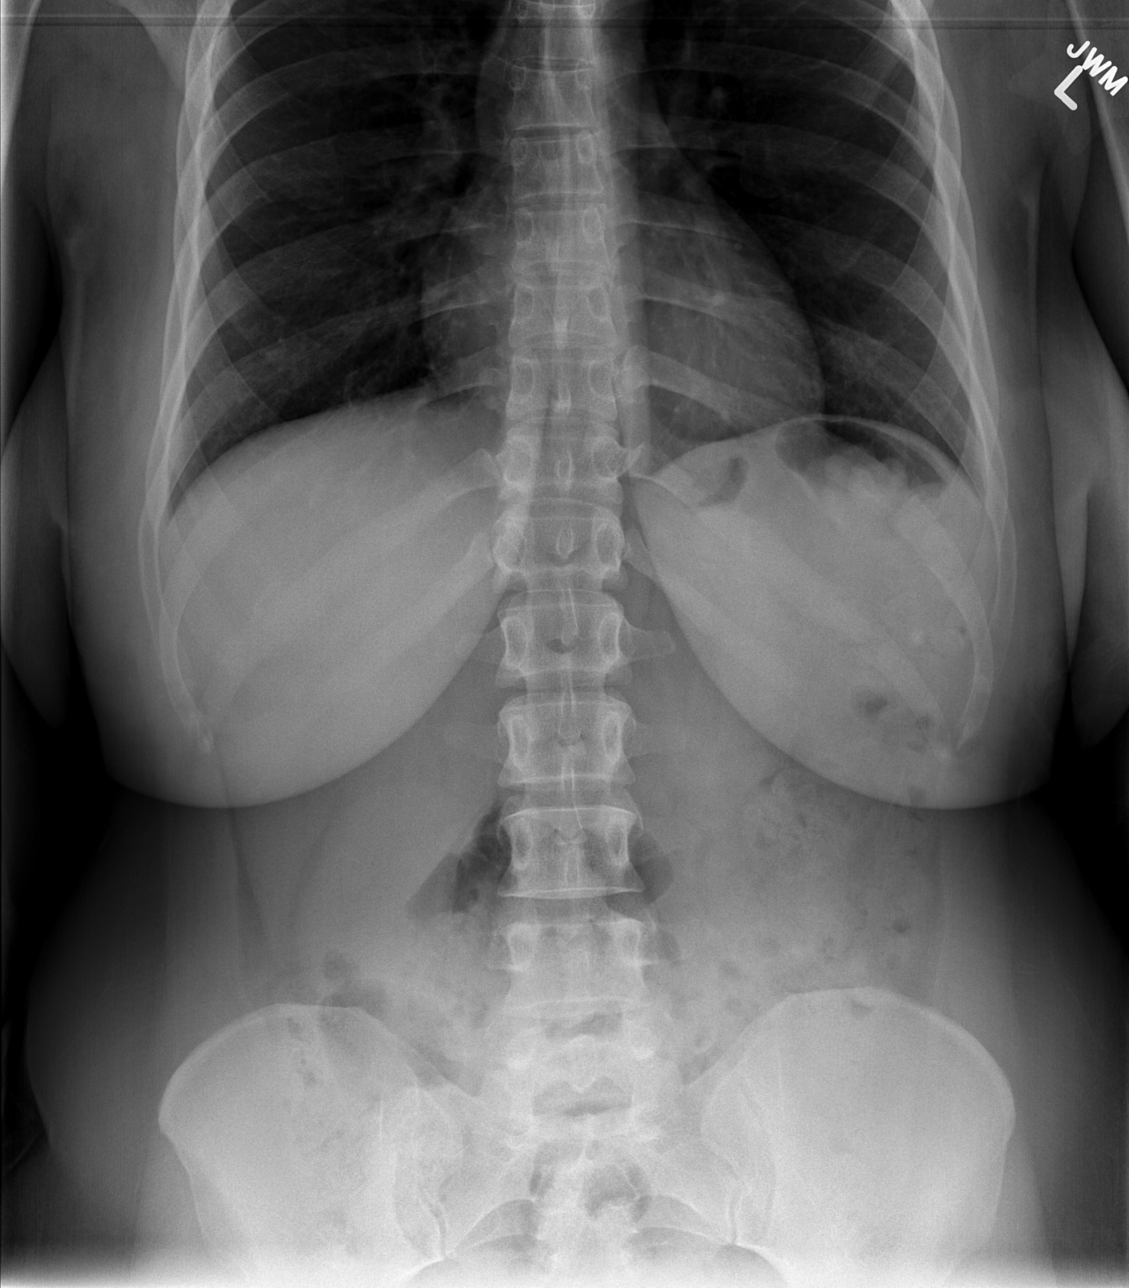

[t abdomen supine]
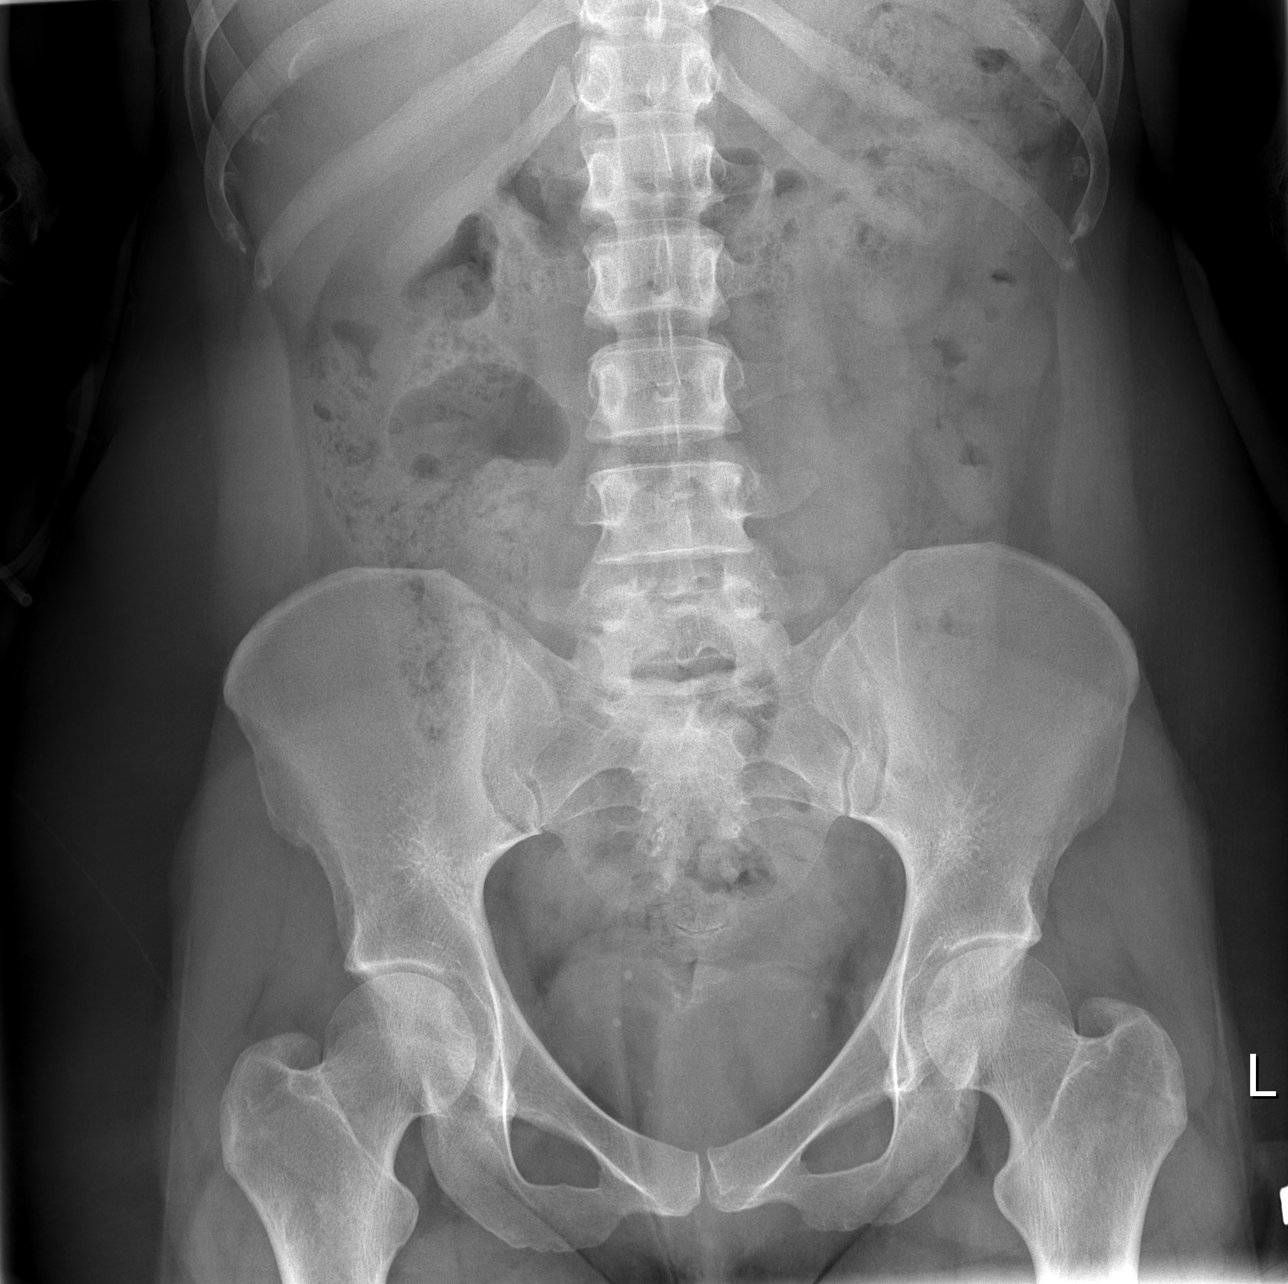

[3 of 3 positions shown; findings below may reference images not displayed]

FINDINGS: Cardiomediastinal silhouette is within normal limits. The
lungs are clear. No pleural effusion.  No pneumothorax.  No acute
osseous abnormality.  No free air beneath the diaphragms.

Normal bowel gas pattern.  No abnormal calcific opacity.  No acute
osseous finding.  Pelvic phleboliths incidentally noted.
IMPRESSION: Normal exam.

## 2013-01-21 ENCOUNTER — Encounter (HOSPITAL_COMMUNITY): Payer: Self-pay | Admitting: *Deleted

## 2013-01-21 ENCOUNTER — Emergency Department (INDEPENDENT_AMBULATORY_CARE_PROVIDER_SITE_OTHER)
Admission: EM | Admit: 2013-01-21 | Discharge: 2013-01-21 | Disposition: A | Discharge status: Home / Self Care | Payer: Self-pay | Source: Home / Self Care | Attending: Emergency Medicine | Admitting: Emergency Medicine

## 2013-01-21 ENCOUNTER — Other Ambulatory Visit (HOSPITAL_COMMUNITY)
Admission: RE | Admit: 2013-01-21 | Discharge: 2013-01-21 | Disposition: A | Discharge status: Home / Self Care | Payer: Self-pay | Source: Ambulatory Visit | Attending: Emergency Medicine | Admitting: Emergency Medicine

## 2013-01-21 DIAGNOSIS — B9689 Other specified bacterial agents as the cause of diseases classified elsewhere: Secondary | ICD-10-CM

## 2013-01-21 DIAGNOSIS — B373 Candidiasis of vulva and vagina: Secondary | ICD-10-CM

## 2013-01-21 DIAGNOSIS — N760 Acute vaginitis: Secondary | ICD-10-CM

## 2013-01-21 DIAGNOSIS — Z113 Encounter for screening for infections with a predominantly sexual mode of transmission: Secondary | ICD-10-CM | POA: Insufficient documentation

## 2013-01-21 DIAGNOSIS — A499 Bacterial infection, unspecified: Secondary | ICD-10-CM

## 2013-01-21 LAB — POCT URINALYSIS DIP (DEVICE)
Bilirubin Urine: NEGATIVE
Protein, ur: NEGATIVE mg/dL
pH: 6.5 (ref 5.0–8.0)

## 2013-01-21 MED ORDER — METRONIDAZOLE 500 MG PO TABS: 500.0000 mg | ORAL_TABLET | Freq: Two times a day (BID) | ORAL | Status: DC

## 2013-01-21 MED ORDER — FLUCONAZOLE 200 MG PO TABS: 200.0000 mg | ORAL_TABLET | Freq: Every day | ORAL | Status: AC

## 2013-01-21 NOTE — ED Notes (Signed)
Patient complains of vaginal itching and burning with discharge; also, complains of rash around genital area that does not go away with use of OTC Lamisil.

## 2013-01-21 NOTE — ED Provider Notes (Signed)
History     CSN: [id number]  Arrival date & time 01/21/13  1611   First MD Initiated Contact with Patient 01/21/13 1727      Chief Complaint  Patient presents with  . Vaginal Itching    HPI: Patient is a 37 y.o. female presenting with vaginal itching. The history is provided by the patient.  Vaginal Itching This is a recurrent problem. The current episode started more than 2 days ago. The problem occurs constantly. The problem has been gradually worsening.  Pt reports a 1 week h/o beige, creamy vaginal d/c that has been associated w/ odor and vag itching. Pt also thinks she has a rash to her external vaginal because it itches as well. Pt admits she is sexually active but uses condoms. Admits to frequent episodes of BV.  Denies UTI sx's. No N/V/D, fever or abd pain. Past Medical History  Diagnosis Date  . Depression   . Bulimia     History reviewed. No pertinent past surgical history.  No family history on file.  History  Substance Use Topics  . Smoking status: Former Games developer  . Smokeless tobacco: Not on file  . Alcohol Use: Yes     Comment: occ    OB History   Grav Para Term Preterm Abortions TAB SAB Ect Mult Living                  Review of Systems  Constitutional: Negative.   HENT: Negative.   Eyes: Negative.   Respiratory: Negative.   Cardiovascular: Negative.   Gastrointestinal: Negative.   Endocrine: Negative.   Genitourinary: Positive for vaginal discharge. Negative for dysuria, frequency, hematuria, genital sores and pelvic pain.  Allergic/Immunologic: Negative.   Neurological: Negative.   Hematological: Negative.   Psychiatric/Behavioral: Negative.     Allergies  Tetracyclines & related  Home Medications   Current Outpatient Rx  Name  Route  Sig  Dispense  Refill  . ibuprofen (ADVIL,MOTRIN) 400 MG tablet   Oral   Take 1 tablet (400 mg total) by mouth every 6 (six) hours as needed for pain.   30 tablet   0   . metroNIDAZOLE (FLAGYL) 500 MG  tablet   Oral   Take 1,000 mg by mouth 2 (two) times daily.         Marland Kitchen OVER THE COUNTER MEDICATION   Oral   Take 1 tablet by mouth at bedtime. OTC sleeping pill         . terbinafine (LAMISIL AT JOCK ITCH) 1 % cream   Topical   Apply 1 application topically 2 (two) times daily as needed. For dryness           Pulse 74  Temp(Src) 98.3 F (36.8 C) (Oral)  Resp 18  SpO2 97%  LMP 01/05/2013  Physical Exam  Constitutional: She is oriented to person, place, and time. She appears well-developed and well-nourished.  HENT:  Head: Normocephalic and atraumatic.  Eyes: Conjunctivae are normal.  Neck: Neck supple.  Cardiovascular: Normal rate.   Pulmonary/Chest: Effort normal.  Abdominal: Soft. Hernia confirmed negative in the right inguinal area and confirmed negative in the left inguinal area.  Genitourinary: Uterus normal. There is no rash, tenderness, lesion or injury on the right labia. There is no rash, tenderness, lesion or injury on the left labia. Cervix exhibits no motion tenderness, no discharge and no friability. Right adnexum displays no mass, no tenderness and no fullness. Left adnexum displays no mass, no tenderness and no fullness.  No erythema, tenderness or bleeding around the vagina. No foreign body around the vagina. No signs of injury around the vagina. Vaginal discharge found.  Musculoskeletal: Normal range of motion.  Lymphadenopathy:       Right: No inguinal adenopathy present.       Left: No inguinal adenopathy present.  Neurological: She is alert and oriented to person, place, and time.  Skin: Skin is warm and dry.    ED Course  Pelvic exam Date/Time: 01/21/2013 6:43 PM Performed by: Leanne Chang Authorized by: Leslee Home C Consent: Verbal consent obtained. Risks and benefits: risks, benefits and alternatives were discussed Consent given by: patient Patient understanding: patient states understanding of the procedure being performed Required  items: required blood products, implants, devices, and special equipment available Patient identity confirmed: verbally with patient and arm band Local anesthesia used: no Patient sedated: no Patient tolerance: Patient tolerated the procedure well with no immediate complications.   (including critical care time)  Labs Reviewed  POCT URINALYSIS DIP (DEVICE) - Abnormal; Notable for the following:    Ketones, ur TRACE (*)    Hgb urine dipstick MODERATE (*)    All other components within normal limits  POCT PREGNANCY, URINE   No results found.   No diagnosis found.    MDM  1 Week h/o vag d/c and itching. H/o frequent BV. PE remarkable for foul smelling thin creamy vag d/c. No perineal/vag rash noted. Given symptoms and exam will treat for yeast and BV and await culture results.          Leanne Chang, NP 01/21/13 1845

## 2013-01-21 NOTE — ED Provider Notes (Signed)
Medical screening examination/treatment/procedure(s) were performed by non-physician practitioner and as supervising physician I was immediately available for consultation/collaboration.  Leslee Home, M.D.  Reuben Likes, MD 01/21/13 [phone]

## 2013-06-10 ENCOUNTER — Emergency Department (HOSPITAL_BASED_OUTPATIENT_CLINIC_OR_DEPARTMENT_OTHER)
Admission: EM | Admit: 2013-06-10 | Discharge: 2013-06-10 | Disposition: A | Discharge status: Home / Self Care | Payer: Self-pay | Attending: Emergency Medicine | Admitting: Emergency Medicine

## 2013-06-10 ENCOUNTER — Encounter (HOSPITAL_BASED_OUTPATIENT_CLINIC_OR_DEPARTMENT_OTHER): Payer: Self-pay | Admitting: *Deleted

## 2013-06-10 DIAGNOSIS — Z87891 Personal history of nicotine dependence: Secondary | ICD-10-CM | POA: Insufficient documentation

## 2013-06-10 DIAGNOSIS — L293 Anogenital pruritus, unspecified: Secondary | ICD-10-CM | POA: Insufficient documentation

## 2013-06-10 DIAGNOSIS — Z79899 Other long term (current) drug therapy: Secondary | ICD-10-CM | POA: Insufficient documentation

## 2013-06-10 DIAGNOSIS — F329 Major depressive disorder, single episode, unspecified: Secondary | ICD-10-CM | POA: Insufficient documentation

## 2013-06-10 DIAGNOSIS — F3289 Other specified depressive episodes: Secondary | ICD-10-CM | POA: Insufficient documentation

## 2013-06-10 DIAGNOSIS — N760 Acute vaginitis: Secondary | ICD-10-CM

## 2013-06-10 LAB — WET PREP, GENITAL
Trich, Wet Prep: NONE SEEN
Yeast Wet Prep HPF POC: NONE SEEN

## 2013-06-10 MED ORDER — METRONIDAZOLE 500 MG PO TABS: 500.0000 mg | ORAL_TABLET | Freq: Two times a day (BID) | ORAL | Status: DC

## 2013-06-10 NOTE — ED Notes (Signed)
Pelvic cart is at the bedside set up and ready for the doctor to use. 

## 2013-06-10 NOTE — ED Provider Notes (Signed)
History    CSN: [id number] Arrival date & time 06/10/13  1648  First MD Initiated Contact with Patient 06/10/13 1718     Chief Complaint  Patient presents with  . Vaginal Discharge   (Consider location/radiation/quality/duration/timing/severity/associated sxs/prior Treatment) Patient is a 37 y.o. female presenting with vaginal discharge. The history is provided by the patient.  Vaginal Discharge Quality:  Yellow Severity:  Moderate Onset quality:  Gradual Duration:  2 weeks Timing:  Intermittent Progression:  Worsening Chronicity:  New Context: spontaneously   Relieved by:  Nothing Worsened by:  Nothing tried Ineffective treatments:  None tried Associated symptoms: vaginal itching   Associated symptoms: no abdominal pain, no dysuria, no fever, no nausea and no urinary frequency   Risk factors: gynecological surgery (c/s 3 weeks ago), STI, STI exposure and unprotected sex   Risk factors: no foreign body and no PID    Tanya Kirk is a 37 y.o. female who presents to the ED for vaginal discharge. LMP 6/27, last pap smear less than one year ago and was normal. New sex partner about 2 weeks ago and the discharge started after that. No longer with that partner. History of GC years ago. Denies abdominal pain, nausea, vomiting or fever.   Past Medical History  Diagnosis Date  . Depression   . Bulimia    History reviewed. No pertinent past surgical history. No family history on file. History  Substance Use Topics  . Smoking status: Former Games developer  . Smokeless tobacco: Not on file  . Alcohol Use: Yes     Comment: occ   OB History   Grav Para Term Preterm Abortions TAB SAB Ect Mult Living                 Review of Systems  Constitutional: Negative for fever.  HENT: Negative for sinus pressure.   Eyes: Negative for visual disturbance.  Gastrointestinal: Negative for nausea and abdominal pain.  Genitourinary: Positive for vaginal discharge. Negative for dysuria and  frequency.  Musculoskeletal: Negative for back pain.  Skin: Negative for rash.  Allergic/Immunologic: Negative for immunocompromised state.  Neurological: Negative for headaches.  Psychiatric/Behavioral: The patient is not nervous/anxious.     Allergies  Tetracyclines & related  Home Medications   Current Outpatient Rx  Name  Route  Sig  Dispense  Refill  . ibuprofen (ADVIL,MOTRIN) 400 MG tablet   Oral   Take 1 tablet (400 mg total) by mouth every 6 (six) hours as needed for pain.   30 tablet   0   . metroNIDAZOLE (FLAGYL) 500 MG tablet   Oral   Take 1,000 mg by mouth 2 (two) times daily.         . metroNIDAZOLE (FLAGYL) 500 MG tablet   Oral   Take 1 tablet (500 mg total) by mouth 2 (two) times daily.   14 tablet   0   . OVER THE COUNTER MEDICATION   Oral   Take 1 tablet by mouth at bedtime. OTC sleeping pill         . terbinafine (LAMISIL AT JOCK ITCH) 1 % cream   Topical   Apply 1 application topically 2 (two) times daily as needed. For dryness          BP 100/67  Pulse 70  Temp(Src) 98.3 F (36.8 C) (Oral)  Resp 18  SpO2 100% Physical Exam  Nursing note and vitals reviewed. Constitutional: She is oriented to person, place, and time. She appears well-developed and  well-nourished. No distress.  HENT:  Head: Normocephalic.  Eyes: EOM are normal.  Neck: Neck supple.  Cardiovascular: Normal rate.   Pulmonary/Chest: Effort normal.  Abdominal: Soft. There is no tenderness.  Genitourinary:  External genitalia without lesions. Small amount of yellow discharge vaginal vault. No CMT, no adnexal tenderness. Uterus without palpable enlargement.  Musculoskeletal: Normal range of motion.  Neurological: She is alert and oriented to person, place, and time. No cranial nerve deficit.  Skin: Skin is warm and dry.  Psychiatric: She has a normal mood and affect. Her behavior is normal.   Results for orders placed during the hospital encounter of 06/10/13 (from the  past 24 hour(s))  WET PREP, GENITAL     Status: Abnormal   Collection Time    06/10/13  6:15 PM      Result Value Range   Yeast Wet Prep HPF POC NONE SEEN  NONE SEEN   Trich, Wet Prep NONE SEEN  NONE SEEN   Clue Cells Wet Prep HPF POC TOO NUMEROUS TO COUNT (*) NONE SEEN   WBC, Wet Prep HPF POC FEW (*) NONE SEEN    ED Course  Procedures   MDM  37 y.o. female with vaginal discharge. Will treat BV.  Discussed with the patient clinical findings and plan of care and all questioned fully answered. She will follow up with her doctor or return if any problems arise. GC, Chlamydia cultures pending.    Medication List    TAKE these medications       metroNIDAZOLE 500 MG tablet  Commonly known as:  FLAGYL  Take 1 tablet (500 mg total) by mouth 2 (two) times daily.      ASK your doctor about these medications       ibuprofen 400 MG tablet  Commonly known as:  ADVIL,MOTRIN  Take 1 tablet (400 mg total) by mouth every 6 (six) hours as needed for pain.     LAMISIL AT JOCK ITCH 1 % cream  Generic drug:  terbinafine  Apply 1 application topically 2 (two) times daily as needed. For dryness     OVER THE COUNTER MEDICATION  Take 1 tablet by mouth at bedtime. OTC sleeping pill          Janne Napoleon, [place] 06/10/13 [phone]
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
## 2013-06-11 LAB — GC/CHLAMYDIA PROBE AMP
CT Probe RNA: NEGATIVE
GC Probe RNA: NEGATIVE

## 2013-06-11 NOTE — ED Provider Notes (Signed)
Medical screening examination/treatment/procedure(s) were performed by non-physician practitioner and as supervising physician I was immediately available for consultation/collaboration.  Coleston Dirosa, MD 06/11/13 0139 

## 2013-10-21 ENCOUNTER — Encounter (HOSPITAL_COMMUNITY): Payer: Self-pay | Admitting: Emergency Medicine

## 2013-10-21 ENCOUNTER — Emergency Department (HOSPITAL_COMMUNITY)
Admission: EM | Admit: 2013-10-21 | Discharge: 2013-10-21 | Disposition: A | Discharge status: Home / Self Care | Payer: Self-pay | Attending: Emergency Medicine | Admitting: Emergency Medicine

## 2013-10-21 DIAGNOSIS — Z8659 Personal history of other mental and behavioral disorders: Secondary | ICD-10-CM | POA: Insufficient documentation

## 2013-10-21 DIAGNOSIS — R102 Pelvic and perineal pain: Secondary | ICD-10-CM

## 2013-10-21 DIAGNOSIS — Z87891 Personal history of nicotine dependence: Secondary | ICD-10-CM | POA: Insufficient documentation

## 2013-10-21 DIAGNOSIS — Z3202 Encounter for pregnancy test, result negative: Secondary | ICD-10-CM | POA: Insufficient documentation

## 2013-10-21 DIAGNOSIS — N949 Unspecified condition associated with female genital organs and menstrual cycle: Secondary | ICD-10-CM | POA: Insufficient documentation

## 2013-10-21 LAB — URINE MICROSCOPIC-ADD ON

## 2013-10-21 LAB — CBC WITH DIFFERENTIAL/PLATELET
Basophils Absolute: 0 10*3/uL (ref 0.0–0.1)
Eosinophils Absolute: 0.1 10*3/uL (ref 0.0–0.7)
Eosinophils Relative: 2 % (ref 0–5)
HCT: 41.1 % (ref 36.0–46.0)
Lymphocytes Relative: 30 % (ref 12–46)
MCH: 30.2 pg (ref 26.0–34.0)
MCHC: 35 g/dL (ref 30.0–36.0)
MCV: 86.2 fL (ref 78.0–100.0)
Monocytes Absolute: 0.5 10*3/uL (ref 0.1–1.0)
Platelets: 333 10*3/uL (ref 150–400)
RDW: 12.2 % (ref 11.5–15.5)

## 2013-10-21 LAB — COMPREHENSIVE METABOLIC PANEL
ALT: 17 U/L (ref 0–35)
AST: 20 U/L (ref 0–37)
CO2: 24 mEq/L (ref 19–32)
Calcium: 9.2 mg/dL (ref 8.4–10.5)
Creatinine, Ser: 0.71 mg/dL (ref 0.50–1.10)
GFR calc Af Amer: >90 mL/min (ref >90)
GFR calc non Af Amer: >90 mL/min (ref >90)
Sodium: 134 mEq/L — ABNORMAL LOW (ref 135–145)
Total Protein: 7.7 g/dL (ref 6.0–8.3)

## 2013-10-21 LAB — PREGNANCY, URINE: Preg Test, Ur: NEGATIVE

## 2013-10-21 LAB — URINALYSIS, ROUTINE W REFLEX MICROSCOPIC
Bilirubin Urine: NEGATIVE
Glucose, UA: 500 mg/dL — AB
Ketones, ur: 15 mg/dL — AB
Protein, ur: NEGATIVE mg/dL
Urobilinogen, UA: 0.2 mg/dL (ref 0.0–1.0)

## 2013-10-21 LAB — WET PREP, GENITAL
Clue Cells Wet Prep HPF POC: NONE SEEN
Trich, Wet Prep: NONE SEEN

## 2013-10-21 MED ORDER — CEFTRIAXONE SODIUM 250 MG IJ SOLR
250.0000 mg | Freq: Once | INTRAMUSCULAR | Status: AC
  Administered 2013-10-21: 250 mg via INTRAMUSCULAR
  Filled 2013-10-21: qty 250

## 2013-10-21 MED ORDER — LIDOCAINE HCL (PF) 1 % IJ SOLN
5.0000 mL | Freq: Once | INTRAMUSCULAR | Status: AC
  Administered 2013-10-21: 5 mL
  Filled 2013-10-21: qty 5

## 2013-10-21 MED ORDER — AZITHROMYCIN 250 MG PO TABS
1000.0000 mg | ORAL_TABLET | Freq: Once | ORAL | Status: AC
  Administered 2013-10-21: 1000 mg via ORAL
  Filled 2013-10-21: qty 4

## 2013-10-21 MED ORDER — ONDANSETRON 4 MG PO TBDP
8.0000 mg | ORAL_TABLET | Freq: Once | ORAL | Status: AC
  Administered 2013-10-21: 8 mg via ORAL
  Filled 2013-10-21: qty 2

## 2013-10-21 NOTE — ED Notes (Addendum)
Pt reports pain in uterus area onset yesterday. Pt reports self treated herself with over the counter medication for yeast infection and feels she may have a bacterial infection. Pt also reports that she has diarrhea x 1 week.

## 2013-10-21 NOTE — ED Provider Notes (Signed)
CSN: [id number]     Arrival date & time 10/21/13  1652 History   First MD Initiated Contact with Patient 10/21/13 1725     Chief Complaint  Patient presents with  . Abdominal Pain   (Consider location/radiation/quality/duration/timing/severity/associated sxs/prior Treatment) HPI 37 year old female who presents complaining of lower abdominal pain that began fairly abruptly this morning. She states that she feels like it is right over her uterus and cramping. She states she has had some pelvic infections in the past and feels like this is similar to those. She has had some vaginal discharge. She states that she has had 2000 sexual partners in her lifetime.  She states that she had some vaginal discharge and used over-the-counter medication for candida last week. She denies any nausea, vomiting, diarrhea, urinary frequency, dysuria, changes in bowel habits, fever or chills. She states that her last menstrual period was 2 weeks ago and was normal. She is not currently using any birth control.        Past Medical History  Diagnosis Date  . Depression   . Bulimia    History reviewed. No pertinent past surgical history. No family history on file. History  Substance Use Topics  . Smoking status: Former Games developer  . Smokeless tobacco: Not on file  . Alcohol Use: Yes     Comment: occ   OB History   Grav Para Term Preterm Abortions TAB SAB Ect Mult Living                 Review of Systems  All other systems reviewed and are negative.    Allergies  Tetracyclines & related  Home Medications   Current Outpatient Rx  Name  Route  Sig  Dispense  Refill  . acetaminophen (TYLENOL) 500 MG tablet   Oral   Take 500 mg by mouth every 8 (eight) hours as needed for mild pain or headache.         . guaiFENesin (ROBITUSSIN) 100 MG/5ML SOLN   Oral   Take 200 mg by mouth every 4 (four) hours as needed for cough or to loosen phlegm.          BP 108/73  Pulse 84  Temp(Src) 99.5 F (37.5  C) (Oral)  Resp 20  Ht 5' 4.5" (1.638 m)  Wt 165 lb (74.844 kg)  BMI 27.90 kg/m2  SpO2 99%  LMP 10/12/2013 Physical Exam  Nursing note and vitals reviewed. Constitutional: She is oriented to person, place, and time. She appears well-developed and well-nourished.  HENT:  Head: Normocephalic and atraumatic.  Right Ear: External ear normal.  Left Ear: External ear normal.  Nose: Nose normal.  Mouth/Throat: Oropharynx is clear and moist.  Eyes: Conjunctivae and EOM are normal. Pupils are equal, round, and reactive to light.  Neck: Normal range of motion. Neck supple. No JVD present. No tracheal deviation present. No thyromegaly present.  Cardiovascular: Normal rate, regular rhythm, normal heart sounds and intact distal pulses.   Pulmonary/Chest: Effort normal and breath sounds normal. No respiratory distress. She has no wheezes.  Abdominal: Soft. Bowel sounds are normal. She exhibits no mass. There is tenderness. There is no guarding.  Mild suprapubic tenderness to palpation no rebound  Genitourinary: Vagina normal.  Tenderness to palpation over uterus no adnexal tenderness.  Musculoskeletal: Normal range of motion.  Lymphadenopathy:    She has no cervical adenopathy.  Neurological: She is alert and oriented to person, place, and time. She has normal reflexes. No cranial nerve deficit or  sensory deficit. Gait normal. GCS eye subscore is 4. GCS verbal subscore is 5. GCS motor subscore is 6.  Reflex Scores:      Bicep reflexes are 2+ on the right side and 2+ on the left side.      Patellar reflexes are 2+ on the right side and 2+ on the left side. Strength is 5/5 bilateral elbow flexor/extensors, wrist extension/flexion, intrinsic hand strength equal Bilateral hip flexion/extension 5/5, knee flexion/extension 5/5, ankle 5/5 flexion extension    Skin: Skin is warm and dry.  Psychiatric: She has a normal mood and affect. Her behavior is normal. Judgment and thought content normal.     ED Course  Procedures (including critical care time) Labs Review Labs Reviewed  URINALYSIS, ROUTINE W REFLEX MICROSCOPIC - Abnormal; Notable for the following:    APPearance CLOUDY (*)    Specific Gravity, Urine 1.038 (*)    Glucose, UA 500 (*)    Hgb urine dipstick LARGE (*)    Ketones, ur 15 (*)    All other components within normal limits  COMPREHENSIVE METABOLIC PANEL - Abnormal; Notable for the following:    Sodium 134 (*)    Glucose, Bld 175 (*)    All other components within normal limits  WET PREP, GENITAL  GC/CHLAMYDIA PROBE AMP  CBC WITH DIFFERENTIAL  URINE MICROSCOPIC-ADD ON  RPR  HIV ANTIBODY (ROUTINE TESTING)   Imaging Review No results found.  EKG Interpretation   None       MDM  No diagnosis found. Patient is treated for STDs here as she is tender over uterus and has a history of multiple sexual partners. She's given Rocephin and Zithromax. Her blood sugars elevated at 175 and she is instructed to followup regarding this.   Hilario Quarry, MD 10/21/13 [phone]

## 2013-10-21 NOTE — ED Notes (Signed)
meds given

## 2013-10-21 NOTE — ED Notes (Signed)
The pt is c/o uterus pain since yesterday worse  Since 1400 today.  No vaginal discharge but she had one  2 weeks ago and monostat over-the -counter cleared it up. lmp nov 7th

## 2013-10-21 NOTE — ED Notes (Signed)
The pts neighbor is at the bedside.  Pt alert no distress. Skin warm and dry

## 2013-10-21 NOTE — ED Notes (Signed)
Pt unable to void at this time for UA sample. 

## 2013-10-22 LAB — GC/CHLAMYDIA PROBE AMP
CT Probe RNA: NEGATIVE
GC Probe RNA: NEGATIVE

## 2013-10-22 LAB — HIV ANTIBODY (ROUTINE TESTING W REFLEX): HIV: NONREACTIVE

## 2014-02-28 ENCOUNTER — Emergency Department (HOSPITAL_COMMUNITY)
Admission: EM | Admit: 2014-02-28 | Discharge: 2014-02-28 | Disposition: A | Discharge status: Home / Self Care | Payer: Self-pay | Attending: Emergency Medicine | Admitting: Emergency Medicine

## 2014-02-28 ENCOUNTER — Encounter (HOSPITAL_COMMUNITY): Payer: Self-pay | Admitting: Emergency Medicine

## 2014-02-28 DIAGNOSIS — K089 Disorder of teeth and supporting structures, unspecified: Secondary | ICD-10-CM | POA: Insufficient documentation

## 2014-02-28 DIAGNOSIS — Z87891 Personal history of nicotine dependence: Secondary | ICD-10-CM | POA: Insufficient documentation

## 2014-02-28 DIAGNOSIS — K0889 Other specified disorders of teeth and supporting structures: Secondary | ICD-10-CM

## 2014-02-28 DIAGNOSIS — Z8659 Personal history of other mental and behavioral disorders: Secondary | ICD-10-CM | POA: Insufficient documentation

## 2014-02-28 MED ORDER — PENICILLIN V POTASSIUM 500 MG PO TABS
500.0000 mg | ORAL_TABLET | Freq: Once | ORAL | Status: AC
  Administered 2014-02-28: 500 mg via ORAL
  Filled 2014-02-28: qty 1

## 2014-02-28 MED ORDER — TRAMADOL HCL 50 MG PO TABS
50.0000 mg | ORAL_TABLET | Freq: Once | ORAL | Status: AC
  Administered 2014-02-28: 50 mg via ORAL
  Filled 2014-02-28: qty 1

## 2014-02-28 MED ORDER — PENICILLIN V POTASSIUM 500 MG PO TABS: 500.0000 mg | ORAL_TABLET | Freq: Four times a day (QID) | ORAL | Status: DC

## 2014-02-28 MED ORDER — TRAMADOL HCL 50 MG PO TABS
50.0000 mg | ORAL_TABLET | Freq: Four times a day (QID) | ORAL | Status: DC | PRN
Start: 2014-02-28 — End: 2014-04-06

## 2014-02-28 NOTE — Progress Notes (Signed)
[facility] did not get to see patient but will be sending information about the [facility] orange card program, using the address provided.

## 2014-02-28 NOTE — Discharge Instructions (Signed)
Follow up with your dentist as scheduled. Take Veetid as directed until gone. Take Tramadol as needed for pain. Refer to attached documents for more information.

## 2014-02-28 NOTE — ED Notes (Signed)
Pt reports severe pain in l/upper mouth increasing over the last few days. Lost a filling one month ago

## 2014-02-28 NOTE — ED Provider Notes (Signed)
CSN: [id number]     Arrival date & time 02/28/14  1402 History  This chart was scribed for non-physician practitioner working with Threasa Beards, MD by Stacy Gardner, ED scribe. This patient was seen in room Kell and the patient's care was started at 2:40 PM.   First MD Initiated Contact with Patient 02/28/14 1405     No chief complaint on file.    (Consider location/radiation/quality/duration/timing/severity/associated sxs/prior Treatment) Patient is a 38 y.o. female presenting with tooth pain. The history is provided by the patient and medical records. No language interpreter was used.  Dental Pain Location:  Upper Severity:  Severe Onset quality:  Gradual Duration:  2 weeks Timing:  Constant Progression:  Worsening Chronicity:  New Context: cap fell off   Previous work-up:  Filled cavity Relieved by:  Nothing  HPI Comments: Tanya Kirk is a 38 y.o. female who presents to the Emergency Department complaining of constant severe, gradually worsening upper left dental pain, onset of two weeks ago and for the past two days the pain has been much worse. The filling of tooth dislodged an the pain was initially mild but it has advanced to severe with time. She has tried extracting her tooth but was unsuccessful. She tried Advil PM to no relief. Pt explains Listerine temporarily relieves the pain. The pain is worse with laying down and at night.  Pt mentions that she has a dental appointment in three weeks and she is unable to get an earlier appointment. Nothing seems to resolve the pain. Past Medical History  Diagnosis Date  . Depression   . Bulimia    No past surgical history on file. No family history on file. History  Substance Use Topics  . Smoking status: Former Research scientist (life sciences)  . Smokeless tobacco: Not on file  . Alcohol Use: Yes     Comment: occ   OB History   Grav Para Term Preterm Abortions TAB SAB Ect Mult Living                 Review of Systems  HENT: Positive  for dental problem.   All other systems reviewed and are negative.      Allergies  Tetracyclines & related  Home Medications   Current Outpatient Rx  Name  Route  Sig  Dispense  Refill  . acetaminophen (TYLENOL) 500 MG tablet   Oral   Take 500 mg by mouth every 8 (eight) hours as needed for mild pain or headache.         . guaiFENesin (ROBITUSSIN) 100 MG/5ML SOLN   Oral   Take 200 mg by mouth every 4 (four) hours as needed for cough or to loosen phlegm.          BP 117/71  Pulse 83  Temp(Src) 98.1 F (36.7 C) (Oral)  Resp 18  SpO2 99%  LMP 02/07/2014 Physical Exam  Nursing note and vitals reviewed. Constitutional: She is oriented to person, place, and time. She appears well-developed and well-nourished. No distress.  HENT:  Head: Normocephalic and atraumatic.  Mouth/Throat: Oropharynx is clear and moist. No oropharyngeal exudate.  Fair dentition Multiple fillings noted No tenderness to percussion of teeth.   Eyes: EOM are normal. Pupils are equal, round, and reactive to light.  Neck: Normal range of motion. Neck supple. No tracheal deviation present.  Cardiovascular: Normal rate.   Pulmonary/Chest: Effort normal. No respiratory distress.  Abdominal: Soft. She exhibits no distension.  Musculoskeletal: Normal range of motion.  Neurological: She  is alert and oriented to person, place, and time.  Skin: Skin is warm and dry.  Psychiatric: She has a normal mood and affect. Her behavior is normal.    ED Course  Procedures (including critical care time) DIAGNOSTIC STUDIES: Oxygen Saturation is 99% on room air, normal by my interpretation.    COORDINATION OF CARE:  2:43 PM Discussed course of care with pt . Pt understands and agrees.    Labs Review Labs Reviewed - No data to display Imaging Review No results found.   EKG Interpretation None      MDM   Final diagnoses:  Pain, dental    2:56 PM Patient given tramadol and veetid here. Patient will  be discharged with prescription of the same. Vitals stable and patient afebrile. Patient has an appointment for dentist follow up.   I personally performed the services described in this documentation, which was scribed in my presence. The recorded information has been reviewed and is accurate.   Alvina Chou, PA-C 02/28/14 1457

## 2014-02-28 NOTE — ED Provider Notes (Signed)
Medical screening examination/treatment/procedure(s) were performed by non-physician practitioner and as supervising physician I was immediately available for consultation/collaboration.   EKG Interpretation None       Threasa Beards, MD 02/28/14 [phone]

## 2014-03-11 MED ORDER — AMOXICILLIN 500 MG PO CAPS: 1000.0000 mg | ORAL_CAPSULE | Freq: Two times a day (BID) | ORAL | Status: DC

## 2014-03-11 NOTE — ED Provider Notes (Signed)
Medical screening examination/treatment/procedure(s) were performed by non-physician practitioner and as supervising physician I was immediately available for consultation/collaboration.   EKG Interpretation None        Osvaldo Shipper, MD 03/11/14 2352

## 2014-03-11 NOTE — ED Provider Notes (Signed)
Patient called ED today stating Rx's were cost prohibitive (from visit 02/28/14) requesting less expensive medication. Review of chart shows Rx's for Tramadol and Penicillin 500 mg given. Penicillin changed to Amoxil 1000mg  BID, #40.     Dewaine Oats, PA-C 03/11/14 1710

## 2014-04-06 ENCOUNTER — Encounter (HOSPITAL_COMMUNITY): Payer: Self-pay | Admitting: Emergency Medicine

## 2014-04-06 ENCOUNTER — Emergency Department (HOSPITAL_COMMUNITY)
Admission: EM | Admit: 2014-04-06 | Discharge: 2014-04-06 | Disposition: A | Discharge status: Home / Self Care | Payer: Self-pay | Attending: Emergency Medicine | Admitting: Emergency Medicine

## 2014-04-06 DIAGNOSIS — Z3202 Encounter for pregnancy test, result negative: Secondary | ICD-10-CM | POA: Insufficient documentation

## 2014-04-06 DIAGNOSIS — A499 Bacterial infection, unspecified: Secondary | ICD-10-CM | POA: Insufficient documentation

## 2014-04-06 DIAGNOSIS — N760 Acute vaginitis: Secondary | ICD-10-CM | POA: Insufficient documentation

## 2014-04-06 DIAGNOSIS — Z8659 Personal history of other mental and behavioral disorders: Secondary | ICD-10-CM | POA: Insufficient documentation

## 2014-04-06 DIAGNOSIS — F17200 Nicotine dependence, unspecified, uncomplicated: Secondary | ICD-10-CM | POA: Insufficient documentation

## 2014-04-06 DIAGNOSIS — B9689 Other specified bacterial agents as the cause of diseases classified elsewhere: Secondary | ICD-10-CM | POA: Insufficient documentation

## 2014-04-06 LAB — URINALYSIS, ROUTINE W REFLEX MICROSCOPIC
Bilirubin Urine: NEGATIVE
GLUCOSE, UA: NEGATIVE mg/dL
Ketones, ur: NEGATIVE mg/dL
Leukocytes, UA: NEGATIVE
Nitrite: NEGATIVE
PH: 6.5 (ref 5.0–8.0)
Protein, ur: NEGATIVE mg/dL
SPECIFIC GRAVITY, URINE: 1.029 (ref 1.005–1.030)
Urobilinogen, UA: 1 mg/dL (ref 0.0–1.0)

## 2014-04-06 LAB — URINE MICROSCOPIC-ADD ON

## 2014-04-06 LAB — WET PREP, GENITAL
TRICH WET PREP: NONE SEEN
WBC, Wet Prep HPF POC: NONE SEEN
YEAST WET PREP: NONE SEEN

## 2014-04-06 MED ORDER — METRONIDAZOLE 500 MG PO TABS: 500.0000 mg | ORAL_TABLET | Freq: Two times a day (BID) | ORAL | Status: DC

## 2014-04-06 MED ORDER — METRONIDAZOLE 500 MG PO TABS
500.0000 mg | ORAL_TABLET | Freq: Once | ORAL | Status: AC
  Administered 2014-04-06: 500 mg via ORAL
  Filled 2014-04-06: qty 1

## 2014-04-06 MED ORDER — IBUPROFEN 800 MG PO TABS
800.0000 mg | ORAL_TABLET | Freq: Once | ORAL | Status: AC
  Administered 2014-04-06: 800 mg via ORAL
  Filled 2014-04-06: qty 1

## 2014-04-06 NOTE — Discharge Instructions (Signed)
Take Flagyl as directed until gone. Follow up at [facility] as needed. Refer to attached documents for more information.

## 2014-04-06 NOTE — ED Notes (Signed)
Pt states she is having lower abd pain and vaginal discharge "for a while." shes been treated for a bacterial vaginal infection recently but states symptoms seem to persist.

## 2014-04-06 NOTE — ED Provider Notes (Signed)
CSN: [id number]     Arrival date & time 04/06/14  1841 History   First MD Initiated Contact with Patient 04/06/14 2008     Chief Complaint  Patient presents with  . Pelvic Pain     (Consider location/radiation/quality/duration/timing/severity/associated sxs/prior Treatment) Patient is a 38 y.o. female presenting with vaginal discharge. The history is provided by the patient. No language interpreter was used.  Vaginal Discharge Quality:  White Severity:  Moderate Onset quality:  Gradual Duration:  1 week Timing:  Constant Progression:  Unchanged Chronicity:  Recurrent Context: not after intercourse, not after urination, not at rest, not during intercourse, not during pregnancy and not during urination   Relieved by:  Nothing Worsened by:  Nothing tried Ineffective treatments:  None tried Associated symptoms: abdominal pain   Associated symptoms: no dysuria, no fever, no nausea and no vomiting   Abdominal pain:    Location:  Suprapubic   Quality:  Aching   Severity:  Moderate   Onset quality:  Gradual   Duration:  1 week   Timing:  Constant   Progression:  Unchanged   Chronicity:  New Risk factors: unprotected sex   Risk factors: no endometriosis, no foreign body, no new sexual partner, no PID and no prior miscarriage     Past Medical History  Diagnosis Date  . Depression   . Bulimia    History reviewed. No pertinent past surgical history. Family History  Problem Relation Age of Onset  . Cancer Other   . Diabetes Other    History  Substance Use Topics  . Smoking status: Current Some Day Smoker  . Smokeless tobacco: Not on file  . Alcohol Use: Yes     Comment: occ   OB History   Grav Para Term Preterm Abortions TAB SAB Ect Mult Living                 Review of Systems  Constitutional: Negative for fever, chills and fatigue.  HENT: Negative for trouble swallowing.   Eyes: Negative for visual disturbance.  Respiratory: Negative for shortness of breath.    Cardiovascular: Negative for chest pain and palpitations.  Gastrointestinal: Positive for abdominal pain. Negative for nausea, vomiting and diarrhea.  Genitourinary: Positive for vaginal discharge. Negative for dysuria and difficulty urinating.  Musculoskeletal: Negative for arthralgias and neck pain.  Skin: Negative for color change.  Neurological: Negative for dizziness and weakness.  Psychiatric/Behavioral: Negative for dysphoric mood.      Allergies  Tetracyclines & related  Home Medications   Prior to Admission medications   Medication Sig Start Date End Date Taking? Authorizing Provider  acetaminophen (TYLENOL) 500 MG tablet Take 500 mg by mouth every 8 (eight) hours as needed for mild pain.    Yes Historical Provider, MD   BP 93/61  Pulse 70  Temp(Src) 99.1 F (37.3 C) (Oral)  Ht 5\' 4"  (1.626 m)  Wt 158 lb (71.668 kg)  BMI 27.11 kg/m2  SpO2 97% Physical Exam  Nursing note and vitals reviewed. Constitutional: She is oriented to person, place, and time. She appears well-developed and well-nourished. No distress.  HENT:  Head: Normocephalic and atraumatic.  Eyes: Conjunctivae are normal.  Neck: Normal range of motion.  Cardiovascular: Normal rate and regular rhythm.  Exam reveals no gallop and no friction rub.   No murmur heard. Pulmonary/Chest: Effort normal and breath sounds normal. She has no wheezes. She has no rales. She exhibits no tenderness.  Abdominal: Soft. There is no tenderness.  Genitourinary:  Vagina normal.  Normal external genitalia. Copious, thick, white vaginal discharge. No CMT or tenderness to palpation on bimanual exam.   Musculoskeletal: Normal range of motion.  Neurological: She is alert and oriented to person, place, and time. Coordination normal.  Speech is goal-oriented. Moves limbs without ataxia.   Skin: Skin is warm and dry.  Psychiatric: She has a normal mood and affect. Her behavior is normal.    ED Course  Procedures (including  critical care time) Labs Review Labs Reviewed  WET PREP, GENITAL - Abnormal; Notable for the following:    Clue Cells Wet Prep HPF POC MODERATE (*)    All other components within normal limits  URINALYSIS, ROUTINE W REFLEX MICROSCOPIC - Abnormal; Notable for the following:    Hgb urine dipstick SMALL (*)    All other components within normal limits  URINE MICROSCOPIC-ADD ON - Abnormal; Notable for the following:    Squamous Epithelial / LPF MANY (*)    Casts HYALINE CASTS (*)    All other components within normal limits  GC/CHLAMYDIA PROBE AMP  POC URINE PREG, ED    Imaging Review No results found.   EKG Interpretation None      MDM   Final diagnoses:  BV (bacterial vaginosis)    9:29 PM Patient has BV based on clue cells seen on wet prep. No other acute changes seen on urinalysis. Vitals stable and patient afebrile. Patient will be discharged with Flagyl.   Alvina Chou, PA-C 04/06/14 2222

## 2014-04-07 NOTE — ED Provider Notes (Signed)
Medical screening examination/treatment/procedure(s) were performed by non-physician practitioner and as supervising physician I was immediately available for consultation/collaboration.    Kathalene Frames, MD 04/07/14 [phone]

## 2014-04-08 LAB — GC/CHLAMYDIA PROBE AMP
CT PROBE, AMP APTIMA: NEGATIVE
GC PROBE AMP APTIMA: NEGATIVE

## 2014-04-10 ENCOUNTER — Ambulatory Visit: Payer: Self-pay | Attending: Internal Medicine

## 2014-04-25 ENCOUNTER — Encounter: Payer: Self-pay | Admitting: Internal Medicine

## 2014-04-25 ENCOUNTER — Ambulatory Visit: Payer: Self-pay | Attending: Internal Medicine | Admitting: Internal Medicine

## 2014-04-25 VITALS — BP 103/69 | HR 74 | Temp 99.3°F | Resp 16 | Ht 64.0 in | Wt 159.0 lb

## 2014-04-25 DIAGNOSIS — K089 Disorder of teeth and supporting structures, unspecified: Secondary | ICD-10-CM

## 2014-04-25 DIAGNOSIS — K029 Dental caries, unspecified: Secondary | ICD-10-CM | POA: Insufficient documentation

## 2014-04-25 DIAGNOSIS — K0889 Other specified disorders of teeth and supporting structures: Secondary | ICD-10-CM

## 2014-04-25 DIAGNOSIS — F17200 Nicotine dependence, unspecified, uncomplicated: Secondary | ICD-10-CM | POA: Insufficient documentation

## 2014-04-25 MED ORDER — ACETAMINOPHEN-CODEINE #3 300-30 MG PO TABS: 1.0000 | ORAL_TABLET | Freq: Three times a day (TID) | ORAL | Status: DC | PRN

## 2014-04-25 NOTE — Progress Notes (Signed)
Patient ID: Tanya Kirk, female   DOB: 1975-12-28, 38 y.o.   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  Has had problems getting a job and learning job functions.  Has lost three jobs because of slow learning. Wants to be evaluated for learning disability. Noticed since age 41.  Has been relaying on people to take care of her.  Wants to be independent.     Tanya Kirk, is a 38 y.o. female  [id number]:[id number]  RCV:[id number]  DOB - 02-05-1976  CC:  Chief Complaint  Patient presents with  . Establish Care  . Referral    dental       HPI: Tanya Kirk is a 38 y.o. female here today to establish medical care.  Patient presents today with a 4 month history of tooth pain. She reports that her mouth hurts on both sides and the pain has improved over the past 2 weeks since antibiotic use.  Patient reports that she noted she has a cracked tooth that is the source of her pain.  Patient reports that she has tried to switch her mouth with Listerine and vodka to relieve the pain.  She reports the pain has gotten so severe she has attempted to remove the tooth herself with nail equipment.  Patient also complains of a short attention span and difficulties with learning.  She reports that she's had this problem since 38 years old.  She denies any previous testing for learning disability but has noticed she cannot keep up with different jobs. She reports that she has been fired from 3 jobs in the past year due to slower learning. Patient is concerned about her learning because she states she is tired of relying on people to take care of her, she wants to be independent.    Allergies  Allergen Reactions  . Tetracyclines & Related    swelling   Past Medical History  Diagnosis Date  . Depression   . Bulimia    Current Outpatient Prescriptions on File Prior to Visit  Medication Sig Dispense Refill  . acetaminophen (TYLENOL) 500 MG tablet Take 500 mg by mouth every 8 (eight) hours as needed for mild pain.       . metroNIDAZOLE (FLAGYL) 500 MG tablet Take 1 tablet (500 mg total) by mouth 2 (two) times daily.  14 tablet  0   No current facility-administered medications on file prior to visit.   Family History  Problem Relation Age of Onset  . Cancer Other   . Diabetes Other    History   Social History  . Marital Status: Single    Spouse Name: N/A    Number of Children: N/A  . Years of Education: N/A   Occupational History  . Not on file.   Social History Main Topics  . Smoking status: Current Some Day Smoker  . Smokeless tobacco: Not on file  . Alcohol Use: Yes     Comment: occ  . Drug Use: Yes    Special: Cocaine, Marijuana  . Sexual Activity: Not Currently    Birth Control/ Protection: Implant   Other Topics Concern  . Not on file   Social History Narrative  . No narrative on file  Review of Systems  Constitutional: Negative.   HENT:       Toothache   Respiratory: Negative.   Cardiovascular: Negative.   Psychiatric/Behavioral: Negative.      Objective:   Filed Vitals:   04/25/14 1425  BP: 103/69  Pulse: 74  Temp: 99.3 F (37.4 C)  Resp: 16    Physical Exam: Constitutional: Patient appears well-developed and well-nourished. No distress. HENT: Normocephalic, atraumatic, External right and left ear normal. Oropharynx is clear and moist. Very poor dentition, multiple dental caries Eyes: Conjunctivae and EOM are normal. PERRLA, no scleral icterus. Neck: Normal ROM. Neck supple. No JVD. No tracheal deviation. No thyromegaly. CVS: RRR, S1/S2 +, no murmurs, no gallops, no carotid bruit.  Pulmonary: Effort and breath sounds normal, no stridor, rhonchi, wheezes, rales.  Abdominal:  Soft. BS +, no distension, tenderness, rebound or guarding.  Musculoskeletal: Normal range of motion. No edema and no tenderness.  Lymphadenopathy: No lymphadenopathy noted, cervical Neuro: Alert. Normal reflexes, muscle tone coordination. No cranial nerve deficit. Skin: Skin is warm and dry. No rash noted. Not diaphoretic. No erythema. No pallor. Psychiatric: Normal mood and affect. Behavior, judgment, thought content normal.  Lab Results  Component Value Date   WBC 6.5 10/21/2013   HGB 14.4 10/21/2013   HCT 41.1 10/21/2013   MCV 86.2 10/21/2013   PLT 333 10/21/2013   Lab Results  Component Value Date   CREATININE 0.71 10/21/2013   BUN 11 10/21/2013   NA 134* 10/21/2013   K 3.7 10/21/2013   CL 100 10/21/2013   CO2 24 10/21/2013    No results found for this basename: HGBA1C   Lipid Panel  No results found for this basename: chol, trig, hdl, cholhdl, vldl, ldlcalc       Assessment and plan:   Tanya Kirk was seen today for establish care and referral.  Diagnoses and associated orders for this visit:  Pain, dental - Ambulatory referral to Dentistry - Ambulatory referral to Psychology - acetaminophen-codeine (TYLENOL #3) 300-30 MG per tablet; Take 1 tablet by mouth every 8 (eight) hours as needed for moderate pain.   Return in about 3 months (around 07/26/2014), or if symptoms worsen or fail to improve, for physical.   Lance Bosch, [facility] [phone] 04/26/2014, 2:45 PM

## 2014-04-25 NOTE — Patient Instructions (Signed)
Dental Caries   Dental caries (also called tooth decay) is the most common oral disease. It can occur at any age, but is more common in children and young adults.   HOW DENTAL CARIES DEVELOPS   The process of decay begins when bacteria and foods (particularly sugars and starches) combine in your mouth to produce plaque. Plaque is a substance that sticks to the hard, outer surface of a tooth (enamel). The bacteria in plaque produce acids that attack enamel. These acids may also attack the root surface of a tooth (cementum) if it is exposed. Repeated attacks dissolve these surfaces and create holes in the tooth (cavities). If left untreated, the acids destroy the other layers of the tooth.   RISK FACTORS  · Frequent sipping of sugary beverages.    · Frequent snacking on sugary and starchy foods, especially those that easily get stuck in the teeth.    · Poor oral hygiene.    · Dry mouth.    · Substance abuse such as methamphetamine abuse.    · Broken or poor-fitting dental restorations.    · Eating disorders.    · Gastroesophageal reflux disease (GERD).    · Certain radiation treatments to the head and neck.  SYMPTOMS  In the early stages of dental caries, symptoms are seldom present. Sometimes white, chalky areas may be seen on the enamel or other tooth layers. In later stages, symptoms may include:  · Pits and holes on the enamel.  · Toothache after sweet, hot, or cold foods or drinks are consumed.  · Pain around the tooth.  · Swelling around the tooth.  DIAGNOSIS   Most of the time, dental caries is detected during a regular dental checkup. A diagnosis is made after a thorough medical and dental history is taken and the surfaces of your teeth are checked for signs of dental caries. Sometimes special instruments, such as lasers, are used to check for dental caries. Dental X-ray exams may be taken so that areas not visible to the eye (such as between the contact areas of the teeth) can be checked for cavities.    TREATMENT   If dental caries is in its early stages, it may be reversed with a fluoride treatment or an application of a remineralizing agent at the dental office. Thorough brushing and flossing at home is needed to aid these treatments. If it is in its later stages, treatment depends on the location and extent of tooth destruction:   · If a small area of the tooth has been destroyed, the destroyed area will be removed and cavities will be filled with a material such as gold, silver amalgam, or composite resin.    · If a large area of the tooth has been destroyed, the destroyed area will be removed and a cap (crown) will be fitted over the remaining tooth structure.    · If the center part of the tooth (pulp) is affected, a procedure called a root canal will be needed before a filling or crown can be placed.    · If most of the tooth has been destroyed, the tooth may need to be pulled (extracted).  HOME CARE INSTRUCTIONS  You can prevent, stop, or reverse dental caries at home by practicing good oral hygiene. Good oral hygiene includes:  · Thoroughly cleaning your teeth at least twice a day with a toothbrush and dental floss.    · Using a fluoride toothpaste. A fluoride mouth rinse may also be used if recommended by your dentist or health care provider.    ·   Restricting the amount of sugary and starchy foods and sugary liquids you consume.    · Avoiding frequent snacking on these foods and sipping of these liquids.    · Keeping regular visits with a dentist for checkups and cleanings.  PREVENTION   · Practice good oral hygiene.  · Consider a dental sealant. A dental sealant is a coating material that is applied by your dentist to the pits and grooves of teeth. The sealant prevents food from being trapped in them. It may protect the teeth for several years.  · Ask about fluoride supplements if you live in a community without fluorinated water or with water that has a low fluoride content. Use fluoride supplements  as directed by your dentist or health care provider.  · Allow fluoride varnish applications to teeth if directed by your dentist or health care provider.  Document Released: 08/14/2002 Document Revised: 07/25/2013 Document Reviewed: 11/24/2012  [employer]® Patient Information ©2014 [employer].

## 2014-04-25 NOTE — Progress Notes (Signed)
Pt here to establish care for left mouth toothache from broken tooth x 3 weeks. States she was seen in [place] and prescribed medication  Swelling reported which has resolved Pt aslo states she has been experiencing depression x 4 mnths from friend being sick Denies suicidal thoughts at this time. Will refer to [facility] for counseling No medical problems noted

## 2014-06-21 ENCOUNTER — Ambulatory Visit: Payer: Self-pay | Admitting: Internal Medicine

## 2014-07-11 ENCOUNTER — Emergency Department (HOSPITAL_COMMUNITY)
Admission: EM | Admit: 2014-07-11 | Discharge: 2014-07-11 | Disposition: A | Discharge status: Home / Self Care | Payer: Self-pay | Attending: Emergency Medicine | Admitting: Emergency Medicine

## 2014-07-11 DIAGNOSIS — K029 Dental caries, unspecified: Secondary | ICD-10-CM | POA: Insufficient documentation

## 2014-07-11 DIAGNOSIS — K0889 Other specified disorders of teeth and supporting structures: Secondary | ICD-10-CM

## 2014-07-11 DIAGNOSIS — F17200 Nicotine dependence, unspecified, uncomplicated: Secondary | ICD-10-CM | POA: Insufficient documentation

## 2014-07-11 DIAGNOSIS — K089 Disorder of teeth and supporting structures, unspecified: Secondary | ICD-10-CM | POA: Insufficient documentation

## 2014-07-11 DIAGNOSIS — Z8659 Personal history of other mental and behavioral disorders: Secondary | ICD-10-CM | POA: Insufficient documentation

## 2014-07-11 DIAGNOSIS — Z792 Long term (current) use of antibiotics: Secondary | ICD-10-CM | POA: Insufficient documentation

## 2014-07-11 MED ORDER — OXYCODONE-ACETAMINOPHEN 5-325 MG PO TABS: ORAL_TABLET | ORAL | Status: DC

## 2014-07-11 MED ORDER — BUPIVACAINE-EPINEPHRINE (PF) 0.5% -1:200000 IJ SOLN
1.8000 mL | Freq: Once | INTRAMUSCULAR | Status: AC
  Administered 2014-07-11: 1.8 mL

## 2014-07-11 MED ORDER — AMOXICILLIN 500 MG PO CAPS: 500.0000 mg | ORAL_CAPSULE | Freq: Three times a day (TID) | ORAL | Status: DC

## 2014-07-11 NOTE — ED Provider Notes (Signed)
Medical screening examination/treatment/procedure(s) were performed by non-physician practitioner and as supervising physician I was immediately available for consultation/collaboration.   EKG Interpretation None        Evelina Bucy, MD 07/11/14 [phone]

## 2014-07-11 NOTE — ED Notes (Signed)
Pt states that she is having trouble finding primary dentist. Pt also states that tooth is broken and tramadol that she was prescribed for another toothache in the past is not working anymore.l

## 2014-07-11 NOTE — ED Notes (Signed)
Pt reports pain to right lower mouth for the last 3 days. Denies recent fever. No swelling noted to face. Pt awake, alert, oriented x4, NAD at present.

## 2014-07-11 NOTE — Discharge Instructions (Signed)
Take percocet for breakthrough pain, do not drink alcohol, drive, care for children or do other critical tasks while taking percocet. ° °Return to the emergency room for fever, change in vision, redness to the face that rapidly spreads towards the eye, nausea or vomiting, difficulty swallowing or shortness of breath. °  °Apply warm compresses to jaw throughout the day.  ° ° Take your antibiotics as directed and to the end of the course.  ° °Followup with a dentist is very important for ongoing evaluation and management of recurrent dental pain. Return to emergency department for emergent changing or worsening symptoms." ° °Low-cost dental clinic: °**David  Civils  at [phone]**  °**[name] at [phone] [address]**   ° °You may also call [phone] ° °Dental Assistance °If the dentist on-call cannot see you, please use the resources below: ° ° °Patients with Medicaid: [facility] °[address], [phone] °[address], [phone] ° °If unable to pay, or uninsured, contact [facility] ([phone]) or [facility] ([phone] in [place], [phone] in [place]) to become qualified for the adult dental clinic ° °Other Low-Cost Community Dental Services: °[facility]- [address], [place], [place], [address] °   [phone], Ext. 123 °   2nd and 4th Thursday of the month at 6:30am °   10 clients each day by appointment, can sometimes see walk-in     patients if someone does not show for an appointment °[facility]- [address], [place], [place], [address] °   [phone] °[facility]- [address], [place], [place], [address] °   [phone] ° °[facility]- [phone] °[facility]- [phone] °[facility]- [phone] ° °

## 2014-07-11 NOTE — ED Provider Notes (Signed)
CSN: [id number]     Arrival date & time 07/11/14  1406 History   First MD Initiated Contact with Patient 07/11/14 1519     Chief Complaint  Patient presents with  . Dental Pain     (Consider location/radiation/quality/duration/timing/severity/associated sxs/prior Treatment) HPI  DWANNA GOSHERT is a 38 y.o. female complaining of severe dental pain worsening over the course of 3 days not alleviated by tramadol. Patient says she has leftover amoxicillin and took a few tabs of fat with little relief. Denies fever/chills, difficulty opening jaw, difficulty swallowing, SOB, gum swelling, facial swelling, neck swelling.   Past Medical History  Diagnosis Date  . Depression   . Bulimia    No past surgical history on file. Family History  Problem Relation Age of Onset  . Cancer Other   . Diabetes Other    History  Substance Use Topics  . Smoking status: Current Some Day Smoker  . Smokeless tobacco: Not on file  . Alcohol Use: Yes     Comment: occ   OB History   Grav Para Term Preterm Abortions TAB SAB Ect Mult Living                 Review of Systems  10 systems reviewed and found to be negative, except as noted in the HPI.   Allergies  Tetracyclines & related  Home Medications   Prior to Admission medications   Medication Sig Start Date End Date Taking? Authorizing Provider  acetaminophen (TYLENOL) 500 MG tablet Take 500 mg by mouth every 8 (eight) hours as needed for mild pain.     Historical Provider, MD  acetaminophen-codeine (TYLENOL #3) 300-30 MG per tablet Take 1 tablet by mouth every 8 (eight) hours as needed for moderate pain. 04/25/14   Lance Bosch, NP  amoxicillin (AMOXIL) 500 MG capsule Take 1 capsule (500 mg total) by mouth 3 (three) times daily. 07/11/14   Blaize Epple, PA-C  metroNIDAZOLE (FLAGYL) 500 MG tablet Take 1 tablet (500 mg total) by mouth 2 (two) times daily. 04/06/14   Alvina Chou, PA-C  oxyCODONE-acetaminophen (PERCOCET/ROXICET) 5-325 MG per  tablet 1 to 2 tabs PO q6hrs  PRN for pain 07/11/14   Elmyra Ricks Aldina Porta, PA-C   BP 116/77  Pulse 71  Temp(Src) 98.3 F (36.8 C) (Oral)  Resp 18  SpO2 97% Physical Exam  Nursing note and vitals reviewed. Constitutional: She is oriented to person, place, and time. She appears well-developed and well-nourished. No distress.  HENT:  Head: Normocephalic.  Mouth/Throat: Uvula is midline and oropharynx is clear and moist. No trismus in the jaw. No uvula swelling. No oropharyngeal exudate, posterior oropharyngeal edema, posterior oropharyngeal erythema or tonsillar abscesses.  Generally poor dentition, no gingival swelling, erythema or tenderness to palpation. Patient is handling their secretions. There is no tenderness to palpation or firmness underneath tongue bilaterally. No trismus.    Eyes: Conjunctivae and EOM are normal.  Cardiovascular: Normal rate.   Pulmonary/Chest: Effort normal. No stridor.  Musculoskeletal: Normal range of motion.  Lymphadenopathy:    She has no cervical adenopathy.  Neurological: She is alert and oriented to person, place, and time.  Psychiatric: She has a normal mood and affect.    ED Course  NERVE BLOCK Date/Time: 07/11/2014 3:47 PM Performed by: Monico Blitz Authorized by: Monico Blitz Consent: Verbal consent obtained. Consent given by: patient Patient identity confirmed: verbally with patient Indications: pain relief Body area: face/mouth Nerve: inferior alveolar Laterality: right Patient sedated: no Patient position: sitting  Needle gauge: 27 G Local anesthetic: bupivacaine 0.5% with epinephrine Anesthetic total: 1.8 ml Outcome: pain improved Patient tolerance: Patient tolerated the procedure well with no immediate complications.   (including critical care time) Labs Review Labs Reviewed - No data to display  Imaging Review No results found.   EKG Interpretation None      MDM   Final diagnoses:  Pain, dental    Filed  Vitals:   07/11/14 1415  BP: 116/77  Pulse: 71  Temp: 98.3 F (36.8 C)  TempSrc: Oral  Resp: 18  SpO2: 97%    Medications  bupivacaine-epinephrine (MARCAINE W/ EPI) 0.5% -1:200000 injection 1.8 mL (1.8 mLs Infiltration Given by Other 07/11/14 1547)    BREEANNA GALGANO is a 38 y.o. female presenting with dental pain associated with dental caries but no signs or symptoms of dental abscess. Patient afebrile, non toxic appearing and swallowing secretions well. I gave patient referral to dentist and stressed the importance of dental follow up for definitive management of dental issues. Patient voices understanding and is agreeable to plan.  Evaluation does not show pathology that would require ongoing emergent intervention or inpatient treatment. Pt is hemodynamically stable and mentating appropriately. Discussed findings and plan with patient/guardian, who agrees with care plan. All questions answered. Return precautions discussed and outpatient follow up given.   New Prescriptions   AMOXICILLIN (AMOXIL) 500 MG CAPSULE    Take 1 capsule (500 mg total) by mouth 3 (three) times daily.   OXYCODONE-ACETAMINOPHEN (PERCOCET/ROXICET) 5-325 MG PER TABLET    1 to 2 tabs PO q6hrs  PRN for pain         Monico Blitz, PA-C 07/11/14 1549

## 2014-07-18 ENCOUNTER — Emergency Department (HOSPITAL_COMMUNITY)
Admission: EM | Admit: 2014-07-18 | Discharge: 2014-07-18 | Disposition: A | Discharge status: Home / Self Care | Payer: Self-pay | Attending: Emergency Medicine | Admitting: Emergency Medicine

## 2014-07-18 ENCOUNTER — Encounter (HOSPITAL_COMMUNITY): Payer: Self-pay | Admitting: Emergency Medicine

## 2014-07-18 DIAGNOSIS — K089 Disorder of teeth and supporting structures, unspecified: Secondary | ICD-10-CM | POA: Insufficient documentation

## 2014-07-18 DIAGNOSIS — F17200 Nicotine dependence, unspecified, uncomplicated: Secondary | ICD-10-CM | POA: Insufficient documentation

## 2014-07-18 DIAGNOSIS — K029 Dental caries, unspecified: Secondary | ICD-10-CM | POA: Insufficient documentation

## 2014-07-18 DIAGNOSIS — Z8659 Personal history of other mental and behavioral disorders: Secondary | ICD-10-CM | POA: Insufficient documentation

## 2014-07-18 DIAGNOSIS — K0889 Other specified disorders of teeth and supporting structures: Secondary | ICD-10-CM

## 2014-07-18 DIAGNOSIS — Z792 Long term (current) use of antibiotics: Secondary | ICD-10-CM | POA: Insufficient documentation

## 2014-07-18 DIAGNOSIS — J029 Acute pharyngitis, unspecified: Secondary | ICD-10-CM | POA: Insufficient documentation

## 2014-07-18 LAB — RAPID STREP SCREEN (MED CTR MEBANE ONLY): Streptococcus, Group A Screen (Direct): NEGATIVE

## 2014-07-18 MED ORDER — IBUPROFEN 800 MG PO TABS: 800.0000 mg | ORAL_TABLET | Freq: Three times a day (TID) | ORAL | Status: DC

## 2014-07-18 NOTE — Discharge Instructions (Signed)
Your rapid strep test was negative, your symptoms are likely viral in nature. Take the prescribed medication as directed. Follow-up with your dentist and primary care physician regarding today's concerns. Return to the ED for new or worsening symptoms.

## 2014-07-18 NOTE — ED Notes (Signed)
Patient states she has continued dental pain R side lower teeth.   Patient states that she has not followed up with dentist because she has not gotten paid.  Patient stated she also started to get a sore throat about 1 1/2 days ago.   Patient states she needs pain medication for both because she only has one pill left.

## 2014-07-18 NOTE — ED Notes (Signed)
Pt c/o pain right side of mouth and throat. States is taking her Amoxicillin. States has two more Percocet which she is saving.

## 2014-07-18 NOTE — ED Provider Notes (Signed)
CSN: [id number]     Arrival date & time 07/18/14  1403 History  This chart was scribed for non-physician practitioner Quincy Carnes, PA-C working with Ephraim Hamburger, MD by Ludger Nutting, ED Scribe. This patient was seen in room TR05C/TR05C and the patient's care was started at 2:42 PM.    Chief Complaint  Patient presents with  . Dental Pain  . Sore Throat    The history is provided by the patient. No language interpreter was used.   HPI Comments: Tanya Kirk is a 38 y.o. female who presents to the Emergency Department complaining of 1-2 days of gradual onset, gradually worsening, constant sore throat. She describes the pain as "scratchy" and states it is worse with swallowing. She has used a throat spray and taken cough drops without relief. She denies fever, difficulty swallowing, SOB.   She also complains of constant, gradually worsening right lower dental pain for the past several days. Seen here 3 days ago for the same, treated with amoxicillin and percocet.  She states she has not followed up with a dentist because she has not gotten paid yet-- she had an appt earlier today but cancelled it.  No trismus.  Past Medical History  Diagnosis Date  . Depression   . Bulimia    History reviewed. No pertinent past surgical history. Family History  Problem Relation Age of Onset  . Cancer Other   . Diabetes Other    History  Substance Use Topics  . Smoking status: Current Some Day Smoker    Types: Cigarettes  . Smokeless tobacco: Not on file  . Alcohol Use: Yes     Comment: occ   OB History   Grav Para Term Preterm Abortions TAB SAB Ect Mult Living                 Review of Systems  Constitutional: Negative for fever.  HENT: Positive for dental problem and sore throat. Negative for trouble swallowing.   All other systems reviewed and are negative.   Allergies  Tetracyclines & related  Home Medications   Prior to Admission medications   Medication Sig Start Date End  Date Taking? Authorizing Provider  acetaminophen (TYLENOL) 500 MG tablet Take 500 mg by mouth every 8 (eight) hours as needed for mild pain.     Historical Provider, MD  acetaminophen-codeine (TYLENOL #3) 300-30 MG per tablet Take 1 tablet by mouth every 8 (eight) hours as needed for moderate pain. 04/25/14   Lance Bosch, NP  amoxicillin (AMOXIL) 500 MG capsule Take 1 capsule (500 mg total) by mouth 3 (three) times daily. 07/11/14   Nicole Pisciotta, PA-C  metroNIDAZOLE (FLAGYL) 500 MG tablet Take 1 tablet (500 mg total) by mouth 2 (two) times daily. 04/06/14   Alvina Chou, PA-C  oxyCODONE-acetaminophen (PERCOCET/ROXICET) 5-325 MG per tablet 1 to 2 tabs PO q6hrs  PRN for pain 07/11/14   Elmyra Ricks Pisciotta, PA-C   BP 107/78  Pulse 94  Temp(Src) 98.7 F (37.1 C) (Oral)  Resp 18  Ht 5\' 4"  (1.626 m)  Wt 160 lb (72.576 kg)  BMI 27.45 kg/m2  SpO2 96%  LMP 07/11/2014  Physical Exam  Nursing note and vitals reviewed. Constitutional: She is oriented to person, place, and time. She appears well-developed and well-nourished. No distress.  HENT:  Head: Normocephalic and atraumatic.  Mouth/Throat: Uvula is midline, oropharynx is clear and moist and mucous membranes are normal. No trismus in the jaw. Abnormal dentition. Dental caries present. No  dental abscesses. No oropharyngeal exudate, posterior oropharyngeal edema, posterior oropharyngeal erythema or tonsillar abscesses.  Teeth largely in poor dentition, multiple areas of severe decay, surrounding gingiva normal in appearance without swelling or discoloration, handling secretions appropriately, no trismus  Tonsils normal in appearance bilaterally without exudate; uvula midline without peritonsillar abscess; handling secretions appropriately; no difficulty swallowing or speaking  Eyes: Conjunctivae and EOM are normal. Pupils are equal, round, and reactive to light.  Neck: Normal range of motion. Neck supple.  Cardiovascular: Normal rate, regular  rhythm and normal heart sounds.   Pulmonary/Chest: Effort normal and breath sounds normal.  Musculoskeletal: Normal range of motion.  Neurological: She is alert and oriented to person, place, and time.  Skin: Skin is warm and dry. She is not diaphoretic.  Psychiatric: She has a normal mood and affect.    ED Course  Procedures (including critical care time)  DIAGNOSTIC STUDIES: Oxygen Saturation is 96% on RA, adequate by my interpretation.    COORDINATION OF CARE: 2:47 PM Discussed treatment plan with pt at bedside and pt agreed to plan.   Labs Review Labs Reviewed  RAPID STREP SCREEN  CULTURE, GROUP A STREP    Imaging Review No results found.   EKG Interpretation None      MDM   Final diagnoses:  Pain, dental  Sore throat   Dental pain without signs of dental abscess.  Rapid strep negative, culture pending.  No airway compromise, handling secretions well.  Patient will FU with dentist and her PCP regarding today's complaints.  Patient given motrin for pain.  Discussed plan with patient, he/she acknowledged understanding and agreed with plan of care.  Return precautions given for new or worsening symptoms.  I personally performed the services described in this documentation, which was scribed in my presence. The recorded information has been reviewed and is accurate  Larene Pickett, PA-C 07/18/14 1603

## 2014-07-19 NOTE — ED Provider Notes (Signed)
Medical screening examination/treatment/procedure(s) were performed by non-physician practitioner and as supervising physician I was immediately available for consultation/collaboration.  Ephraim Hamburger, MD 07/19/14 [phone]

## 2014-07-20 LAB — CULTURE, GROUP A STREP

## 2014-07-26 ENCOUNTER — Encounter: Payer: Self-pay | Admitting: Internal Medicine

## 2014-08-16 ENCOUNTER — Encounter: Payer: Self-pay | Admitting: Internal Medicine

## 2014-08-28 ENCOUNTER — Emergency Department (HOSPITAL_BASED_OUTPATIENT_CLINIC_OR_DEPARTMENT_OTHER)
Admission: EM | Admit: 2014-08-28 | Discharge: 2014-08-28 | Discharge status: Against Medical Advice | Payer: Self-pay | Attending: Emergency Medicine | Admitting: Emergency Medicine

## 2014-08-28 ENCOUNTER — Encounter (HOSPITAL_BASED_OUTPATIENT_CLINIC_OR_DEPARTMENT_OTHER): Payer: Self-pay | Admitting: Emergency Medicine

## 2014-08-28 DIAGNOSIS — Z792 Long term (current) use of antibiotics: Secondary | ICD-10-CM | POA: Insufficient documentation

## 2014-08-28 DIAGNOSIS — N898 Other specified noninflammatory disorders of vagina: Secondary | ICD-10-CM | POA: Insufficient documentation

## 2014-08-28 DIAGNOSIS — Z8659 Personal history of other mental and behavioral disorders: Secondary | ICD-10-CM | POA: Insufficient documentation

## 2014-08-28 DIAGNOSIS — F17200 Nicotine dependence, unspecified, uncomplicated: Secondary | ICD-10-CM | POA: Insufficient documentation

## 2014-08-28 DIAGNOSIS — Z79899 Other long term (current) drug therapy: Secondary | ICD-10-CM | POA: Insufficient documentation

## 2014-08-28 NOTE — ED Notes (Signed)
Pt reports her menstrual cycle started as she collected urine sample and sample given was very bloody- will recollect

## 2014-08-28 NOTE — ED Notes (Signed)
Pt requesting to see another EDP. Pt states she was just doodling on her phone and that the EDP was rude. Informed patient that the physician asked her a question 3 times and the patient would not stop looking at her phone to answer him. Informed patient that we did have another provider here but that it would be a little be as she had other patients infront of her. Patient states"I'll just leave and come back later or call my GYN doctor to see if they can see me. Patient asked would she be billed for this visit. Informed patient that she would be billed for services she received today. Pt still requesting to sign out and come back later. Explained to patient risks and benefits of leaving. Pt in no distress and signed out AMA.

## 2014-08-28 NOTE — ED Notes (Signed)
Reports vaginal d/c x 2 weeks

## 2014-08-28 NOTE — ED Notes (Signed)
EDP states he went in to see pt-pt would not get off her phone for exam

## 2014-08-30 NOTE — ED Provider Notes (Signed)
CSN: [id number]     Arrival date & time 08/28/14  1023 History   First MD Initiated Contact with Patient 08/28/14 1319     Chief Complaint  Patient presents with  . Vaginal Discharge     (Consider location/radiation/quality/duration/timing/severity/associated sxs/prior Treatment) HPI Comments: "I have a bacterial infection."  Remainder of history obtained from nursing staff.  Pt has had 2 weeks of vaginal discharge.  Unable to obtain additional history or ROS secondary to patient cooperation.  Patient is a 38 y.o. female presenting with vaginal discharge.  Vaginal Discharge   Past Medical History  Diagnosis Date  . Depression   . Bulimia    History reviewed. No pertinent past surgical history. Family History  Problem Relation Age of Onset  . Cancer Other   . Diabetes Other    History  Substance Use Topics  . Smoking status: Current Some Day Smoker    Types: Cigarettes  . Smokeless tobacco: Never Used  . Alcohol Use: Yes     Comment: occ   OB History   Grav Para Term Preterm Abortions TAB SAB Ect Mult Living                 Review of Systems  Unable to perform ROS: Other  Genitourinary: Positive for vaginal discharge.      Allergies  Tetracyclines & related  Home Medications   Prior to Admission medications   Medication Sig Start Date End Date Taking? Authorizing Provider  amoxicillin (AMOXIL) 500 MG capsule Take 500 mg by mouth 3 (three) times daily.    Historical Provider, MD  ibuprofen (ADVIL,MOTRIN) 800 MG tablet Take 1 tablet (800 mg total) by mouth 3 (three) times daily. 07/18/14   Larene Pickett, PA-C  phenol (CHLORASEPTIC) 1.4 % LIQD Use as directed 1 spray in the mouth or throat as needed for throat irritation / pain.    Historical Provider, MD   BP 105/69  Pulse 76  Temp(Src) 98.3 F (36.8 C) (Oral)  Resp 20  Ht 5\' 4"  (1.626 m)  Wt 155 lb (70.308 kg)  BMI 26.59 kg/m2  SpO2 100%  LMP 08/07/2014 Physical Exam  Nursing note and vitals  reviewed. Constitutional: She appears well-developed and well-nourished. No distress.  HENT:  Head: Normocephalic and atraumatic.  Eyes: No scleral icterus.  Pulmonary/Chest: Effort normal. No stridor. No respiratory distress.  Abdominal: Normal appearance. She exhibits no distension.  Neurological: She is alert.  Skin: Skin is warm and dry. No rash noted.  Psychiatric: She has a normal mood and affect. Her speech is normal.    ED Course  Procedures (including critical care time) Labs Review Labs Reviewed - No data to display  Imaging Review No results found.   EKG Interpretation None      MDM   Final diagnoses:  Vaginal discharge    Pt presented with vaginal discharge . She would not provide history because she was unwilling to get off of her cell phone.  Attempted to come back later to obtain history, but pt decided to elope from ED prior to further evaluation.     Houston Siren III, MD 08/30/14 [phone]

## 2014-09-10 ENCOUNTER — Telehealth: Payer: Self-pay | Admitting: *Deleted

## 2014-09-10 NOTE — Telephone Encounter (Signed)
LCSW called pateint in order to follow up on mental health needs and ED utilization.  Patient phone numbers were disconnected.  Christene Lye MSW, LCSW

## 2014-10-28 ENCOUNTER — Encounter (HOSPITAL_COMMUNITY): Payer: Self-pay | Admitting: *Deleted

## 2014-10-28 ENCOUNTER — Emergency Department (HOSPITAL_COMMUNITY)
Admission: EM | Admit: 2014-10-28 | Discharge: 2014-10-28 | Disposition: A | Discharge status: Home / Self Care | Payer: Self-pay | Attending: Emergency Medicine | Admitting: Emergency Medicine

## 2014-10-28 DIAGNOSIS — B373 Candidiasis of vulva and vagina: Secondary | ICD-10-CM | POA: Insufficient documentation

## 2014-10-28 DIAGNOSIS — Z792 Long term (current) use of antibiotics: Secondary | ICD-10-CM | POA: Insufficient documentation

## 2014-10-28 DIAGNOSIS — Z8659 Personal history of other mental and behavioral disorders: Secondary | ICD-10-CM | POA: Insufficient documentation

## 2014-10-28 DIAGNOSIS — Z720 Tobacco use: Secondary | ICD-10-CM | POA: Insufficient documentation

## 2014-10-28 DIAGNOSIS — Z791 Long term (current) use of non-steroidal anti-inflammatories (NSAID): Secondary | ICD-10-CM | POA: Insufficient documentation

## 2014-10-28 DIAGNOSIS — B3731 Acute candidiasis of vulva and vagina: Secondary | ICD-10-CM

## 2014-10-28 LAB — WET PREP, GENITAL
CLUE CELLS WET PREP: NONE SEEN
TRICH WET PREP: NONE SEEN

## 2014-10-28 MED ORDER — FLUCONAZOLE 150 MG PO TABS: 150.0000 mg | ORAL_TABLET | Freq: Every day | ORAL | Status: AC

## 2014-10-28 NOTE — ED Provider Notes (Signed)
CSN: [id number]     Arrival date & time 10/28/14  0344 History   First MD Initiated Contact with Patient 10/28/14 0351     Chief Complaint  Patient presents with  . Vaginal Discharge     (Consider location/radiation/quality/duration/timing/severity/associated sxs/prior Treatment) Patient is a 38 y.o. female presenting with vaginal discharge. The history is provided by the patient. No language interpreter was used.  Vaginal Discharge Severity:  Moderate Duration:  1 month Timing:  Constant Associated symptoms: no abdominal pain, no dysuria, no fever and no vomiting   Associated symptoms comment:  She is here for evaluation of vaginal discharge and itching for the past one month. No fever, abdominal pain, dysuria. She feels it is similar to multiple episodes of "bacterial vaginal infection" she has had in the past.    Past Medical History  Diagnosis Date  . Depression   . Bulimia    History reviewed. No pertinent past surgical history. Family History  Problem Relation Age of Onset  . Cancer Other   . Diabetes Other    History  Substance Use Topics  . Smoking status: Current Some Day Smoker    Types: Cigarettes  . Smokeless tobacco: Never Used  . Alcohol Use: Yes     Comment: occ   OB History    No data available     Review of Systems  Constitutional: Negative for fever.  Gastrointestinal: Negative for vomiting and abdominal pain.  Genitourinary: Positive for vaginal discharge. Negative for dysuria.  Musculoskeletal: Negative for myalgias.      Allergies  Tetracyclines & related  Home Medications   Prior to Admission medications   Medication Sig Start Date End Date Taking? Authorizing Provider  acetaminophen (TYLENOL) 500 MG tablet Take 500 mg by mouth every 8 (eight) hours as needed for headache.   Yes Historical Provider, MD  ibuprofen (ADVIL,MOTRIN) 200 MG tablet Take 200 mg by mouth every 6 (six) hours as needed for headache.   Yes Historical Provider, MD   amoxicillin (AMOXIL) 500 MG capsule Take 500 mg by mouth 3 (three) times daily.    Historical Provider, MD  ibuprofen (ADVIL,MOTRIN) 800 MG tablet Take 1 tablet (800 mg total) by mouth 3 (three) times daily. 07/18/14   Larene Pickett, PA-C  phenol (CHLORASEPTIC) 1.4 % LIQD Use as directed 1 spray in the mouth or throat as needed for throat irritation / pain.    Historical Provider, MD   BP 99/65 mmHg  Pulse 77  Temp(Src) 98 F (36.7 C) (Oral)  Resp 16  Ht 5\' 5"  (1.651 m)  Wt 150 lb (68.04 kg)  BMI 24.96 kg/m2  SpO2 100%  LMP 10/24/2014 (Approximate) Physical Exam  Constitutional: She is oriented to person, place, and time. She appears well-developed and well-nourished. No distress.  Pulmonary/Chest: Effort normal.  Abdominal: Soft. There is no tenderness.  Genitourinary:  Thin discharge present that is yellowish in color. No CMT, adnexal mass or tenderness.   Musculoskeletal: Normal range of motion.  Neurological: She is alert and oriented to person, place, and time.    ED Course  Procedures (including critical care time) Labs Review Labs Reviewed  WET PREP, GENITAL - Abnormal; Notable for the following:    Yeast Wet Prep HPF POC FEW (*)    WBC, Wet Prep HPF POC FEW (*)    All other components within normal limits  GC/CHLAMYDIA PROBE AMP    Imaging Review No results found.   EKG Interpretation None  MDM   Final diagnoses:  None    1. Vaginal yeast infection  No evidence of BV. I do not feel with her exam, no abdominal pain, or pelvic tenderness that there is reason to treat for STD. Will give Diflucan for finding of yeast and history of vaginal itching. Encouraged outpatient follow up for recurrent symptoms.     Dewaine Oats, PA-C 10/28/14 8315  Wandra Arthurs, MD 10/28/14 [phone]

## 2014-10-28 NOTE — ED Notes (Signed)
Patient states vaginal discharge x 1 month, patient states slight odor, light d/c.

## 2014-10-28 NOTE — Discharge Instructions (Signed)

## 2014-10-28 NOTE — ED Notes (Signed)
Pelvic cart at the bedside 

## 2014-10-29 LAB — GC/CHLAMYDIA PROBE AMP
CT Probe RNA: NEGATIVE
GC Probe RNA: NEGATIVE

## 2014-11-22 ENCOUNTER — Encounter: Payer: Self-pay | Admitting: Internal Medicine

## 2014-11-22 ENCOUNTER — Ambulatory Visit: Payer: Self-pay | Attending: Internal Medicine | Admitting: Internal Medicine

## 2014-11-22 VITALS — BP 96/61 | HR 68 | Temp 98.4°F | Resp 18 | Ht 64.0 in | Wt 162.0 lb

## 2014-11-22 DIAGNOSIS — M722 Plantar fascial fibromatosis: Secondary | ICD-10-CM

## 2014-11-22 DIAGNOSIS — Z2821 Immunization not carried out because of patient refusal: Secondary | ICD-10-CM

## 2014-11-22 MED ORDER — MELOXICAM 15 MG PO TABS: 15.0000 mg | ORAL_TABLET | Freq: Every day | ORAL | Status: DC

## 2014-11-22 NOTE — Progress Notes (Signed)
Complaining of foot pain Stated hard to walk, legs giving up and cramping Pain worst in the morning x 3 month

## 2014-11-22 NOTE — Patient Instructions (Signed)
Plantar Fasciitis Plantar fasciitis is a common condition that causes foot pain. It is soreness (inflammation) of the band of tough fibrous tissue on the bottom of the foot that runs from the heel bone (calcaneus) to the ball of the foot. The cause of this soreness may be from excessive standing, poor fitting shoes, running on hard surfaces, being overweight, having an abnormal walk, or overuse (this is common in runners) of the painful foot or feet. It is also common in aerobic exercise dancers and ballet dancers. SYMPTOMS  Most people with plantar fasciitis complain of:  Severe pain in the morning on the bottom of their foot especially when taking the first steps out of bed. This pain recedes after a few minutes of walking.  Severe pain is experienced also during walking following a long period of inactivity.  Pain is worse when walking barefoot or up stairs DIAGNOSIS   Your caregiver will diagnose this condition by examining and feeling your foot.  Special tests such as X-rays of your foot, are usually not needed. PREVENTION   Consult a sports medicine professional before beginning a new exercise program.  Walking programs offer a good workout. With walking there is a lower chance of overuse injuries common to runners. There is less impact and less jarring of the joints.  Begin all new exercise programs slowly. If problems or pain develop, decrease the amount of time or distance until you are at a comfortable level.  Wear good shoes and replace them regularly.  Stretch your foot and the heel cords at the back of the ankle (Achilles tendon) both before and after exercise.  Run or exercise on even surfaces that are not hard. For example, asphalt is better than pavement.  Do not run barefoot on hard surfaces.  If using a treadmill, vary the incline.  Do not continue to workout if you have foot or joint problems. Seek professional help if they do not improve. HOME CARE INSTRUCTIONS     Avoid activities that cause you pain until you recover.  Use ice or cold packs on the problem or painful areas after working out.  Only take over-the-counter or prescription medicines for pain, discomfort, or fever as directed by your caregiver.  Soft shoe inserts or athletic shoes with air or gel sole cushions may be helpful.  If problems continue or become more severe, consult a sports medicine caregiver or your own health care provider. Cortisone is a potent anti-inflammatory medication that may be injected into the painful area. You can discuss this treatment with your caregiver. MAKE SURE YOU:   Understand these instructions.  Will watch your condition.  Will get help right away if you are not doing well or get worse. Document Released: 08/17/2001 Document Revised: 02/14/2012 Document Reviewed: 10/16/2008 [facility] Patient Information 2015 [place], [place]. This information is not intended to replace advice given to you by your health care provider. Make sure you discuss any questions you have with your health care provider.   Plantar Fasciitis (Heel Spur Syndrome) with Rehab The plantar fascia is a fibrous, ligament-like, soft-tissue structure that spans the bottom of the foot. Plantar fasciitis is a condition that causes pain in the foot due to inflammation of the tissue. SYMPTOMS   Pain and tenderness on the underneath side of the foot.  Pain that worsens with standing or walking. CAUSES  Plantar fasciitis is caused by irritation and injury to the plantar fascia on the underneath side of the foot. Common mechanisms of injury include:  Direct trauma to bottom of the foot.  Damage to a small nerve that runs under the foot where the main fascia attaches to the heel bone.  Stress placed on the plantar fascia due to bone spurs. RISK INCREASES WITH:   Activities that place stress on the plantar fascia (running, jumping, pivoting, or cutting).  Poor strength and  flexibility.  Improperly fitted shoes.  Tight calf muscles.  Flat feet.  Failure to warm-up properly before activity.  Obesity. PREVENTION  Warm up and stretch properly before activity.  Allow for adequate recovery between workouts.  Maintain physical fitness:  Strength, flexibility, and endurance.  Cardiovascular fitness.  Maintain a health body weight.  Avoid stress on the plantar fascia.  Wear properly fitted shoes, including arch supports for individuals who have flat feet. PROGNOSIS  If treated properly, then the symptoms of plantar fasciitis usually resolve without surgery. However, occasionally surgery is necessary. RELATED COMPLICATIONS   Recurrent symptoms that may result in a chronic condition.  Problems of the lower back that are caused by compensating for the injury, such as limping.  Pain or weakness of the foot during push-off following surgery.  Chronic inflammation, scarring, and partial or complete fascia tear, occurring more often from repeated injections. TREATMENT  Treatment initially involves the use of ice and medication to help reduce pain and inflammation. The use of strengthening and stretching exercises may help reduce pain with activity, especially stretches of the Achilles tendon. These exercises may be performed at home or with a therapist. Your caregiver may recommend that you use heel cups of arch supports to help reduce stress on the plantar fascia. Occasionally, corticosteroid injections are given to reduce inflammation. If symptoms persist for greater than 6 months despite non-surgical (conservative), then surgery may be recommended.  MEDICATION   If pain medication is necessary, then nonsteroidal anti-inflammatory medications, such as aspirin and ibuprofen, or other minor pain relievers, such as acetaminophen, are often recommended.  Do not take pain medication within 7 days before surgery.  Prescription pain relievers may be given if  deemed necessary by your caregiver. Use only as directed and only as much as you need.  Corticosteroid injections may be given by your caregiver. These injections should be reserved for the most serious cases, because they may only be given a certain number of times. HEAT AND COLD  Cold treatment (icing) relieves pain and reduces inflammation. Cold treatment should be applied for 10 to 15 minutes every 2 to 3 hours for inflammation and pain and immediately after any activity that aggravates your symptoms. Use ice packs or massage the area with a piece of ice (ice massage).  Heat treatment may be used prior to performing the stretching and strengthening activities prescribed by your caregiver, physical therapist, or athletic trainer. Use a heat pack or soak the injury in warm water. SEEK IMMEDIATE MEDICAL CARE IF:  Treatment seems to offer no benefit, or the condition worsens.  Any medications produce adverse side effects. EXERCISES RANGE OF MOTION (ROM) AND STRETCHING EXERCISES - Plantar Fasciitis (Heel Spur Syndrome) These exercises may help you when beginning to rehabilitate your injury. Your symptoms may resolve with or without further involvement from your physician, physical therapist or athletic trainer. While completing these exercises, remember:   Restoring tissue flexibility helps normal motion to return to the joints. This allows healthier, less painful movement and activity.  An effective stretch should be held for at least 30 seconds.  A stretch should never be painful. You should  only feel a gentle lengthening or release in the stretched tissue. RANGE OF MOTION - Toe Extension, Flexion  Sit with your right / left leg crossed over your opposite knee.  Grasp your toes and gently pull them back toward the top of your foot. You should feel a stretch on the bottom of your toes and/or foot.  Hold this stretch for __________ seconds.  Now, gently pull your toes toward the bottom  of your foot. You should feel a stretch on the top of your toes and or foot.  Hold this stretch for __________ seconds. Repeat __________ times. Complete this stretch __________ times per day.  RANGE OF MOTION - Ankle Dorsiflexion, Active Assisted  Remove shoes and sit on a chair that is preferably not on a carpeted surface.  Place right / left foot under knee. Extend your opposite leg for support.  Keeping your heel down, slide your right / left foot back toward the chair until you feel a stretch at your ankle or calf. If you do not feel a stretch, slide your bottom forward to the edge of the chair, while still keeping your heel down.  Hold this stretch for __________ seconds. Repeat __________ times. Complete this stretch __________ times per day.  STRETCH - Gastroc, Standing  Place hands on wall.  Extend right / left leg, keeping the front knee somewhat bent.  Slightly point your toes inward on your back foot.  Keeping your right / left heel on the floor and your knee straight, shift your weight toward the wall, not allowing your back to arch.  You should feel a gentle stretch in the right / left calf. Hold this position for __________ seconds. Repeat __________ times. Complete this stretch __________ times per day. STRETCH - Soleus, Standing  Place hands on wall.  Extend right / left leg, keeping the other knee somewhat bent.  Slightly point your toes inward on your back foot.  Keep your right / left heel on the floor, bend your back knee, and slightly shift your weight over the back leg so that you feel a gentle stretch deep in your back calf.  Hold this position for __________ seconds. Repeat __________ times. Complete this stretch __________ times per day. STRETCH - Gastrocsoleus, Standing  Note: This exercise can place a lot of stress on your foot and ankle. Please complete this exercise only if specifically instructed by your caregiver.   Place the ball of your right  / left foot on a step, keeping your other foot firmly on the same step.  Hold on to the wall or a rail for balance.  Slowly lift your other foot, allowing your body weight to press your heel down over the edge of the step.  You should feel a stretch in your right / left calf.  Hold this position for __________ seconds.  Repeat this exercise with a slight bend in your right / left knee. Repeat __________ times. Complete this stretch __________ times per day.  STRENGTHENING EXERCISES - Plantar Fasciitis (Heel Spur Syndrome)  These exercises may help you when beginning to rehabilitate your injury. They may resolve your symptoms with or without further involvement from your physician, physical therapist or athletic trainer. While completing these exercises, remember:   Muscles can gain both the endurance and the strength needed for everyday activities through controlled exercises.  Complete these exercises as instructed by your physician, physical therapist or athletic trainer. Progress the resistance and repetitions only as guided. STRENGTH - Towel  Curls  Sit in a chair positioned on a non-carpeted surface.  Place your foot on a towel, keeping your heel on the floor.  Pull the towel toward your heel by only curling your toes. Keep your heel on the floor.  If instructed by your physician, physical therapist or athletic trainer, add ____________________ at the end of the towel. Repeat __________ times. Complete this exercise __________ times per day. STRENGTH - Ankle Inversion  Secure one end of a rubber exercise band/tubing to a fixed object (table, pole). Loop the other end around your foot just before your toes.  Place your fists between your knees. This will focus your strengthening at your ankle.  Slowly, pull your big toe up and in, making sure the band/tubing is positioned to resist the entire motion.  Hold this position for __________ seconds.  Have your muscles resist the  band/tubing as it slowly pulls your foot back to the starting position. Repeat __________ times. Complete this exercises __________ times per day.  Document Released: 11/22/2005 Document Revised: 02/14/2012 Document Reviewed: 03/06/2009 [facility] Patient Information 2015 [place], [place]. This information is not intended to replace advice given to you by your health care provider. Make sure you discuss any questions you have with your health care provider.

## 2014-11-22 NOTE — Progress Notes (Signed)
Patient ID: Tanya Kirk, female   DOB: 1976/07/24, 37 y.o.   MRN: [id number]  CC: foot pain  HPI: Tanya Kirk is a 38 y.o. female here today for a follow up visit.  Patient has past medical history of depression and bulimia.  Patient presents to clinic today with concerns of foot pain for the past three months. She reports that it feels like she has swollen rubber bands under her foot.  The pain is aggravated by sitting or laying flat for a prolonged period of time and then getting back up to walk.  Walking on her tip toes helps relieve the pain.  The pain is in the arch of bilateral feet and heels. 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  She is not in therapy now and has never seen a provider for this.    Patient has No headache, No chest pain, No abdominal pain - No Nausea, No new weakness tingling or numbness, No Cough - SOB.  Allergies  Allergen Reactions  . Tetracyclines & Related     swelling   Past Medical History  Diagnosis Date  . Depression   . Bulimia    Current Outpatient Prescriptions on File Prior to Visit  Medication Sig Dispense Refill  . acetaminophen (TYLENOL) 500 MG tablet Take 500 mg by mouth every 8 (eight) hours as needed for headache.    . ibuprofen (ADVIL,MOTRIN) 200 MG tablet Take 200 mg by mouth every 6 (six) hours as needed for headache.    . ibuprofen (ADVIL,MOTRIN) 800 MG tablet Take 1 tablet (800 mg total) by mouth 3 (three) times daily. 30 tablet 0  . amoxicillin (AMOXIL) 500 MG capsule Take 500 mg by mouth 3 (three) times daily.    . phenol (CHLORASEPTIC) 1.4 % LIQD Use as directed 1 spray in the mouth or throat as needed for throat irritation / pain.     No current facility-administered medications on file prior to visit.   Family History  Problem Relation Age of Onset  . Cancer Other   . Diabetes Other    History   Social History  . Marital Status: Single   Spouse Name: N/A    Number of Children: N/A  . Years of Education: N/A   Occupational History  . Not on file.   Social History Main Topics  . Smoking status: Current Some Day Smoker    Types: Cigarettes  . Smokeless tobacco: Never Used  . Alcohol Use: Yes     Comment: occ  . Drug Use: Yes    Special: Cocaine, Marijuana     Comment: not currently  . Sexual Activity: Yes    Birth Control/ Protection: Condom   Other Topics Concern  . Not on file   Social History Narrative    Review of Systems: See HPI   Objective:   Filed Vitals:   11/22/14 1429  BP: 96/61  Pulse: 68  Temp: 98.4 F (36.9 C)  Resp: 18    Physical Exam  Constitutional: She is oriented to person, place, and time.  Cardiovascular: Normal rate, regular rhythm and normal heart sounds.   Pulmonary/Chest: Effort normal and breath sounds normal.  Abdominal: Soft. Bowel sounds are normal.  Musculoskeletal: She exhibits tenderness (bilateral heels). She exhibits no edema.  Neurological: She is alert and oriented to person, place, and time.  Skin: Skin is warm and dry.  Psychiatric: She has a normal  mood and affect. Her behavior is normal.     Lab Results  Component Value Date   WBC 6.5 10/21/2013   HGB 14.4 10/21/2013   HCT 41.1 10/21/2013   MCV 86.2 10/21/2013   PLT 333 10/21/2013   Lab Results  Component Value Date   CREATININE 0.71 10/21/2013   BUN 11 10/21/2013   NA 134* 10/21/2013   K 3.7 10/21/2013   CL 100 10/21/2013   CO2 24 10/21/2013    No results found for: HGBA1C Lipid Panel  No results found for: CHOL, TRIG, HDL, CHOLHDL, VLDL, LDLCALC     Assessment and plan:   [name] was seen today for foot pain.  Diagnoses and associated orders for this visit:  Plantar fasciitis, bilateral - meloxicam (MOBIC) 15 MG tablet; Take 1 tablet (15 mg total) by mouth daily.  Refused influenza vaccine Explained that annual influenza is recommended per CDC guidelines and is highly suggested  to anyone who has has CHF, COPD, DM or immunocompromised. Benefits of influenza described in detail.  Return if symptoms worsen or fail to improve. Patient given resources for [facility] and [facility] for counseling and proper diagnosis.       Chari Manning, NP-C [facility] [phone] 11/22/2014, 2:47 PM\

## 2015-02-05 ENCOUNTER — Encounter (HOSPITAL_COMMUNITY): Payer: Self-pay

## 2015-02-05 ENCOUNTER — Emergency Department (HOSPITAL_COMMUNITY)
Admission: EM | Admit: 2015-02-05 | Discharge: 2015-02-05 | Disposition: A | Discharge status: Home / Self Care | Payer: No Typology Code available for payment source | Attending: Emergency Medicine | Admitting: Emergency Medicine

## 2015-02-05 DIAGNOSIS — Z8659 Personal history of other mental and behavioral disorders: Secondary | ICD-10-CM | POA: Insufficient documentation

## 2015-02-05 DIAGNOSIS — R319 Hematuria, unspecified: Secondary | ICD-10-CM

## 2015-02-05 DIAGNOSIS — Z8619 Personal history of other infectious and parasitic diseases: Secondary | ICD-10-CM | POA: Insufficient documentation

## 2015-02-05 DIAGNOSIS — Z792 Long term (current) use of antibiotics: Secondary | ICD-10-CM | POA: Insufficient documentation

## 2015-02-05 DIAGNOSIS — N760 Acute vaginitis: Secondary | ICD-10-CM | POA: Insufficient documentation

## 2015-02-05 DIAGNOSIS — Z3202 Encounter for pregnancy test, result negative: Secondary | ICD-10-CM | POA: Insufficient documentation

## 2015-02-05 DIAGNOSIS — B9689 Other specified bacterial agents as the cause of diseases classified elsewhere: Secondary | ICD-10-CM

## 2015-02-05 DIAGNOSIS — Z720 Tobacco use: Secondary | ICD-10-CM | POA: Insufficient documentation

## 2015-02-05 DIAGNOSIS — Z791 Long term (current) use of non-steroidal anti-inflammatories (NSAID): Secondary | ICD-10-CM | POA: Insufficient documentation

## 2015-02-05 LAB — URINALYSIS, ROUTINE W REFLEX MICROSCOPIC
Bilirubin Urine: NEGATIVE
Glucose, UA: NEGATIVE mg/dL
Ketones, ur: 15 mg/dL — AB
LEUKOCYTES UA: NEGATIVE
NITRITE: NEGATIVE
PH: 5.5 (ref 5.0–8.0)
Protein, ur: NEGATIVE mg/dL
SPECIFIC GRAVITY, URINE: 1.03 (ref 1.005–1.030)
Urobilinogen, UA: 0.2 mg/dL (ref 0.0–1.0)

## 2015-02-05 LAB — URINE MICROSCOPIC-ADD ON

## 2015-02-05 LAB — WET PREP, GENITAL
Trich, Wet Prep: NONE SEEN
WBC WET PREP: NONE SEEN
Yeast Wet Prep HPF POC: NONE SEEN

## 2015-02-05 LAB — POC URINE PREG, ED: Preg Test, Ur: NEGATIVE

## 2015-02-05 MED ORDER — METRONIDAZOLE 500 MG PO TABS
500.0000 mg | ORAL_TABLET | Freq: Once | ORAL | Status: AC
  Administered 2015-02-05: 500 mg via ORAL
  Filled 2015-02-05: qty 1

## 2015-02-05 MED ORDER — METRONIDAZOLE 500 MG PO TABS: 500.0000 mg | ORAL_TABLET | Freq: Two times a day (BID) | ORAL | Status: DC

## 2015-02-05 NOTE — ED Notes (Signed)
Pt. Reports brown vaginal discharge with odor. Seen here previously for same and prescribed antibiotics but states that "they prescribed me the wrong medicine, I need to take flagyl".

## 2015-02-05 NOTE — Discharge Instructions (Signed)
Do not hesitate to return to the emergency room for any new, worsening or concerning symptoms.  Please obtain primary care using resource guide below. But the minute you were seen in the emergency room and that they will need to obtain records for further outpatient management.   Bacterial Vaginosis Bacterial vaginosis is a vaginal infection that occurs when the normal balance of bacteria in the vagina is disrupted. It results from an overgrowth of certain bacteria. This is the most common vaginal infection in women of childbearing age. Treatment is important to prevent complications, especially in pregnant women, as it can cause a premature delivery. CAUSES  Bacterial vaginosis is caused by an increase in harmful bacteria that are normally present in smaller amounts in the vagina. Several different kinds of bacteria can cause bacterial vaginosis. However, the reason that the condition develops is not fully understood. RISK FACTORS Certain activities or behaviors can put you at an increased risk of developing bacterial vaginosis, including:  Having a new sex partner or multiple sex partners.  Douching.  Using an intrauterine device (IUD) for contraception. Women do not get bacterial vaginosis from toilet seats, bedding, swimming pools, or contact with objects around them. SIGNS AND SYMPTOMS  Some women with bacterial vaginosis have no signs or symptoms. Common symptoms include:  Grey vaginal discharge.  A fishlike odor with discharge, especially after sexual intercourse.  Itching or burning of the vagina and vulva.  Burning or pain with urination. DIAGNOSIS  Your health care provider will take a medical history and examine the vagina for signs of bacterial vaginosis. A sample of vaginal fluid may be taken. Your health care provider will look at this sample under a microscope to check for bacteria and abnormal cells. A vaginal pH test may also be done.  TREATMENT  Bacterial vaginosis  may be treated with antibiotic medicines. These may be given in the form of a pill or a vaginal cream. A second round of antibiotics may be prescribed if the condition comes back after treatment.  HOME CARE INSTRUCTIONS   Only take over-the-counter or prescription medicines as directed by your health care provider.  If antibiotic medicine was prescribed, take it as directed. Make sure you finish it even if you start to feel better.  Do not have sex until treatment is completed.  Tell all sexual partners that you have a vaginal infection. They should see their health care provider and be treated if they have problems, such as a mild rash or itching.  Practice safe sex by using condoms and only having one sex partner. SEEK MEDICAL CARE IF:   Your symptoms are not improving after 3 days of treatment.  You have increased discharge or pain.  You have a fever. MAKE SURE YOU:   Understand these instructions.  Will watch your condition.  Will get help right away if you are not doing well or get worse. FOR MORE INFORMATION  Centers for Disease Control and Prevention, Division of STD Prevention: AppraiserFraud.fi American Sexual Health Association (ASHA): www.ashastd.org  Document Released: 11/22/2005 Document Revised: 09/12/2013 Document Reviewed: 07/04/2013 [facility] Patient Information 2015 [place], [place]. This information is not intended to replace advice given to you by your health care provider. Make sure you discuss any questions you have with your health care provider.    Emergency Department Resource Guide 1) Find a Doctor and Pay Out of Pocket Although you won't have to find out who is covered by your insurance plan, it is a good idea to  ask around and get recommendations. You will then need to call the office and see if the doctor you have chosen will accept you as a new patient and what types of options they offer for patients who are self-pay. Some doctors offer discounts or will  set up payment plans for their patients who do not have insurance, but you will need to ask so you aren't surprised when you get to your appointment.  2) Contact Your Local Health Department Not all health departments have doctors that can see patients for sick visits, but many do, so it is worth a call to see if yours does. If you don't know where your local health department is, you can check in your phone book. The CDC also has a tool to help you locate your state's health department, and many state websites also have listings of all of their local health departments.  3) Find a Colon Clinic If your illness is not likely to be very severe or complicated, you may want to try a walk in clinic. These are popping up all over the country in pharmacies, drugstores, and shopping centers. They're usually staffed by nurse practitioners or physician assistants that have been trained to treat common illnesses and complaints. They're usually fairly quick and inexpensive. However, if you have serious medical issues or chronic medical problems, these are probably not your best option.  No Primary Care Doctor: - Call Health Connect at  [phone] - they can help you locate a primary care doctor that  accepts your insurance, provides certain services, etc. - Physician Referral Service- [phone]  Chronic Pain Problems: Organization         Address  Phone   Notes  [facility]  [phone] Patients need to be referred by their primary care doctor.   Medication Assistance: Organization         Address  Phone   Notes  [facility] Medication [facility] [address] [place], [place] [address] [phone] --Must be a resident of [facility] -- Must have NO insurance coverage whatsoever (no Medicaid/ Medicare, etc.) -- The pt. MUST have a primary care doctor that directs their care regularly and follows them in the community   MedAssist  [phone]    [employer]  [phone]    Agencies that provide inexpensive medical care: Organization         Address  Phone   Notes  [place]  [phone]   [facility]    [phone]   [facility] [place], [place] [address] [phone]   [place] [address] [place] [phone]   Planned Parenthood    [phone]   [facility]    [phone]   [place] and [place] [address], [place] Phone:  [phone], Fax:  [phone] Hours of Operation:  9 am - 6 pm, M-F.  Also accepts Medicaid/Medicare and self-pay.  [facility] for [place], [address], [place] Phone: [phone], Fax: [phone]. Hours of Operation:  8:30 am - 5:30 pm, M-F.  Also accepts Medicaid and self-pay.  [facility] [place] [address], [place] Phone: [phone]   [place], [place], [place] [phone], Ext. [phone] Mondays & Thursdays: 7-9 AM.  First 15 patients are  seen on a first come, first serve basis.    [place] Providers:  Organization         Address  Phone   Notes  [facility] [address], [place] [phone] Also accepts self-pay patients.  [facility] [address], [place]  [phone]   [place], [address], [place] [phone]   [facility] [address], [place] [phone]   Lucianne Lei [address], [place]   [phone] Only accepts [place] Access [place] patients after they have their name applied to their card.   Self-Pay (no insurance) in [facility]:  Organization         Address  Phone   Notes  Sickle Cell Patients, [facility] Internal [facility] [place] [phone]   [facility] [place] [phone]   [facility] [place]  [place], [place], [place] [phone]   [facility]/Dr. Osei-Bonsu  [address] [place] or [address], [place] [phone] Phone number for both [place] and [place] locations is the same.  Urgent Medical and [facility] [address], [place] [phone]   [facility] [address], [place] or [address] [phone]   [facility] [address] [place] [phone], phone; [phone], fax Sees patients 1st and 3rd Saturday of every month.  Must not qualify for public or private insurance (i.e. Medicaid, Medicare, [place] Health Choice, Veterans' Benefits)  Household income should be no more than 200% of the poverty level The clinic cannot treat you if you are pregnant or think you are pregnant  Sexually transmitted diseases are not treated at the clinic.    Dental Care: Organization         Address  Phone  Notes  [facility] Department of [facility] [place] [phone] Accepts children up to age 64 who are enrolled in [place] or [place]; pregnant women with a Medicaid card; and children who have applied for Medicaid or [place] Health Choice, but were declined, whose parents can pay a reduced fee at time of service.  [facility] Department of [facility]  [address], [place] [phone] Accepts children up to age 64 who are enrolled in [place] or [place]; pregnant women with a Medicaid card; and children who have applied for Medicaid or  Health Choice, but were declined, whose parents can pay a reduced fee at time of service.  [place] Adult Dental Access PROGRAM  [place] [phone] Patients are seen by appointment only. Walk-ins are not accepted. [place] will see patients 39 years of age and older. Monday - Tuesday (8am-5pm) Most Wednesdays (8:30-5pm) $30 per visit, cash only  [facility] Adult Dental Access PROGRAM  [address], [facility] [phone] Patients are seen by appointment only. Walk-ins are not accepted. [place] will see patients 37 years of age and older. One Wednesday Evening (Monthly: Volunteer Based).  $30 per visit, cash only  [place]  [phone] for adults; Children under age 65, call Graduate Pediatric Dentistry at [phone])  [phone]. Children aged 81-14, please call [phone] to request a pediatric application.  Dental services are provided in all areas of dental care including fillings, crowns and bridges, complete and partial dentures, implants, gum treatment, root canals, and extractions. Preventive care is also provided. Treatment is provided to both adults and children. Patients are selected via a lottery and there is often a waiting list.   [facility] [address], [place]  [phone] [email or web address]   [facility] [address] [place], [place] [phone], Ext. [phone] Second and Fourth Thursday of each month, opens at 6:30 AM; Clinic ends at 9 AM.  Patients are seen on a first-come first-served basis, and a limited number are seen during each clinic.   [facility]  [address] [place] Danker [place], [place] [phone]   Eligibility Requirements You must have lived in [place], [place], or [place] counties for at least the last three months.   You cannot be eligible for state or federal sponsored [employer], including [employer], [place], or [employer].   You generally cannot be eligible for healthcare insurance through your employer.    How to apply: Eligibility screenings are held every Tuesday and Wednesday afternoon  from 1:00 pm until 4:00 pm. You do not need an appointment for the interview!  [facility] [address], [place], [place]   [place]  [place] Department  [place]  [phone]    Behavioral Health Resources in the Community: Intensive Outpatient Programs Organization         Address  Phone  Notes  [place]. [address] [place], [place] [phone]   [facility] Outpatient [address], [place], [place]   ADS: Alcohol & Drug Svcs [address], [place], [place]   [place] [address],  [place], [place] or [phone]   Substance Abuse Resources Organization         Address  Phone  Notes  Alcohol and Drug Services  [phone]   [place]  [phone]   [facility] [place]   Chinita Pester  [phone]   Residential & Outpatient Substance Abuse Program  [phone]   Psychological Services Organization         Address  Phone  Notes  [facility] [place]  [place]  [phone]   [place] [address] [place] or [phone]    Mobile Crisis Teams Organization         Address  Phone  Notes  [facility], Mobile Crisis Care Unit  [phone]   Assertive Psychotherapeutic Services  [address] [place], [place]   [place] Levels [address], [place] Home [phone]    Self-Help/Support Groups Organization         Address  Phone             Notes  [place]. of [place] - variety of support groups  [place] Call for more information  Narcotics Anonymous (NA), Caring Services [address], [place]  2 meetings at this location   Cytogeneticist  Notes  ASAP Residential Treatment [place],    [place]  [place]  [address] [place] [address], [place], [employer]   [place], [place] [phone] Admissions: 8am-3pm M-F  Incentives Substance [place] [address],    [place], [place] [phone]   [facility] [address] [place], [place], [place]   [facility] [address],  [place], [place]   [facility]ight Programs - Intensive Outpatient [address], [place], [place]   [facility] ([place].) [place].,  [place], [place] [phone] or [phone]   [facility]idential Treatment Services (RTS) [address] [place], [place] Accepts Medicaid  [facility] [place] [address]  [place] [phone] Substance Abuse/Addiction Treatment   [facility] Organization         Address  Phone  Notes  [facility]  [phone]   Domenic Schwab, PhD [address] Arlis Porta [place], [place]   [phone] or [phone]   [place], [place] [phone]   [facility] [address] [place], [place] [phone] Insurance/Medicaid/sponsorship through [facility] and Families [address], [address] [place]                                    [place], [place] [phone] [place] [address], [place] [phone]    Dr. Adele Schilder  [phone]   [facility] Dept. 1) [address], [place] 2) [place] 3)  [place] [address], [place] [phone]  [phone]   [place] [phone] or [phone] (After Hours)

## 2015-02-05 NOTE — ED Provider Notes (Signed)
CSN: [id number]     Arrival date & time 02/05/15  1240 History   None    Chief Complaint  Patient presents with  . Vaginal Discharge     (Consider location/radiation/quality/duration/timing/severity/associated sxs/prior Treatment) HPI   Tanya Kirk is a 39 y.o. female complaining of complaints of vaginal discharge. She states that she was seen in the ED 3 months ago for similar problems and was found to have a yeast infection and treated with Diflucan. According to the patient, this medicine did not alleviate her symptoms and she believes that she needs Flagyl. She describes the discharge as white and brown in color and malodorous. She is having suprapubic pain and also complains of difficulty urinating, saying that it takes her a long time to urinate. Denies fever or chills. She is sexually active with 3 female partners and uses condoms as protection. LMP was seven days ago. She also states that she would like testing for HIV and herpes.  It was explained to the patient that we cannot test for herpes in the ED but can make a referral to OB/GYN for further testing and follow up for her symptoms.   Past Medical History  Diagnosis Date  . Depression   . Bulimia    History reviewed. No pertinent past surgical history. Family History  Problem Relation Age of Onset  . Cancer Other   . Diabetes Other    History  Substance Use Topics  . Smoking status: Current Some Day Smoker    Types: Cigarettes  . Smokeless tobacco: Never Used  . Alcohol Use: Yes     Comment: occ   OB History    No data available     Review of Systems  10 systems reviewed and found to be negative, except as noted in the HPI.   Allergies  Tetracyclines & related  Home Medications   Prior to Admission medications   Medication Sig Start Date End Date Taking? Authorizing Provider  amoxicillin (AMOXIL) 500 MG capsule Take 500 mg by mouth 3 (three) times daily.    Historical Provider, MD  meloxicam (MOBIC) 15 MG  tablet Take 1 tablet (15 mg total) by mouth daily. 11/22/14   Lance Bosch, NP  metroNIDAZOLE (FLAGYL) 500 MG tablet Take 1 tablet (500 mg total) by mouth 2 (two) times daily. One tab PO bid x 14 days 02/05/15   Elmyra Ricks Bryann Mcnealy, PA-C  phenol (CHLORASEPTIC) 1.4 % LIQD Use as directed 1 spray in the mouth or throat as needed for throat irritation / pain.    Historical Provider, MD   BP 102/78 mmHg  Pulse 83  Temp(Src) 98 F (36.7 C) (Oral)  Resp 18  SpO2 97%  LMP 01/29/2015 Physical Exam  Constitutional: She is oriented to person, place, and time. She appears well-developed and well-nourished. No distress.  HENT:  Head: Normocephalic and atraumatic.  Mouth/Throat: Oropharynx is clear and moist.  Eyes: Conjunctivae and EOM are normal. Pupils are equal, round, and reactive to light.  Neck: Normal range of motion.  Cardiovascular: Normal rate and regular rhythm.   Pulmonary/Chest: Effort normal and breath sounds normal. No stridor.  Abdominal: Soft. Bowel sounds are normal. She exhibits no distension.  Genitourinary:  Exam chaperoned by nurse: No rashes or lesions, no abnormal vaginal discharge, no cervical motion or adnexal tenderness.  Musculoskeletal: Normal range of motion.  Neurological: She is alert and oriented to person, place, and time.  Psychiatric: She has a normal mood and affect.  Nursing note  and vitals reviewed.   ED Course  Procedures (including critical care time) Labs Review Labs Reviewed  WET PREP, GENITAL - Abnormal; Notable for the following:    Clue Cells Wet Prep HPF POC FEW (*)    All other components within normal limits  URINALYSIS, ROUTINE W REFLEX MICROSCOPIC - Abnormal; Notable for the following:    APPearance CLOUDY (*)    Hgb urine dipstick MODERATE (*)    Ketones, ur 15 (*)    All other components within normal limits  URINE MICROSCOPIC-ADD ON - Abnormal; Notable for the following:    Squamous Epithelial / LPF MANY (*)    Bacteria, UA FEW (*)     All other components within normal limits  RPR  HIV ANTIBODY (ROUTINE TESTING)  POC URINE PREG, ED  GC/CHLAMYDIA PROBE AMP ([place])    Imaging Review No results found.   EKG Interpretation None      MDM   Final diagnoses:  Bacterial vaginosis  Hematuria    Filed Vitals:   02/05/15 1259 02/05/15 1530 02/05/15 1710  BP: 106/92 112/97 102/78  Pulse: 77 71 83  Temp: 97.6 F (36.4 C)  98 F (36.7 C)  TempSrc: Oral  Oral  Resp: 18  18  SpO2: 97% 97% 97%    Medications  metroNIDAZOLE (FLAGYL) tablet 500 mg (500 mg Oral Given 02/05/15 1709)    Tanya Kirk is a pleasant 39 y.o. female presenting with persistent vaginal discharge over several months. On pelvic exam she has a scant to moderate amount of abnormal vaginal discharge. She does have a few clue cells on her wet prep. We'll treat for bacterial vaginosis and give referral to GYN. Serial abdominal exams are benign. Urinalysis with hematuria, states her last menstrual period ended week ago. I've advised her to follow at urgent care for recheck in several weeks. Resource guide given.  Evaluation does not show pathology that would require ongoing emergent intervention or inpatient treatment. Pt is hemodynamically stable and mentating appropriately. Discussed findings and plan with patient/guardian, who agrees with care plan. All questions answered. Return precautions discussed and outpatient follow up given.   Discharge Medication List as of 02/05/2015  5:07 PM    START taking these medications   Details  metroNIDAZOLE (FLAGYL) 500 MG tablet Take 1 tablet (500 mg total) by mouth 2 (two) times daily. One tab PO bid x 14 days, Starting 02/05/2015, Until Discontinued, Print             Monico Blitz, PA-C 02/05/15 [place], MD 02/05/15 2025

## 2015-02-06 LAB — GC/CHLAMYDIA PROBE AMP (~~LOC~~) NOT AT ARMC
CHLAMYDIA, DNA PROBE: NEGATIVE
Neisseria Gonorrhea: NEGATIVE

## 2015-02-06 LAB — HIV ANTIBODY (ROUTINE TESTING W REFLEX): HIV SCREEN 4TH GENERATION: NONREACTIVE

## 2015-02-06 LAB — RPR: RPR Ser Ql: NONREACTIVE

## 2015-02-07 ENCOUNTER — Telehealth (HOSPITAL_BASED_OUTPATIENT_CLINIC_OR_DEPARTMENT_OTHER): Payer: Self-pay | Admitting: Emergency Medicine

## 2015-02-25 ENCOUNTER — Ambulatory Visit: Payer: No Typology Code available for payment source | Admitting: Internal Medicine

## 2015-03-02 ENCOUNTER — Encounter (HOSPITAL_COMMUNITY): Payer: Self-pay | Admitting: Family Medicine

## 2015-03-02 ENCOUNTER — Emergency Department (HOSPITAL_COMMUNITY): Payer: No Typology Code available for payment source

## 2015-03-02 ENCOUNTER — Emergency Department (HOSPITAL_COMMUNITY)
Admission: EM | Admit: 2015-03-02 | Discharge: 2015-03-02 | Disposition: A | Discharge status: Home / Self Care | Payer: Self-pay | Attending: Emergency Medicine | Admitting: Emergency Medicine

## 2015-03-02 DIAGNOSIS — Z791 Long term (current) use of non-steroidal anti-inflammatories (NSAID): Secondary | ICD-10-CM | POA: Insufficient documentation

## 2015-03-02 DIAGNOSIS — Z8659 Personal history of other mental and behavioral disorders: Secondary | ICD-10-CM | POA: Insufficient documentation

## 2015-03-02 DIAGNOSIS — X58XXXA Exposure to other specified factors, initial encounter: Secondary | ICD-10-CM | POA: Insufficient documentation

## 2015-03-02 DIAGNOSIS — Y9289 Other specified places as the place of occurrence of the external cause: Secondary | ICD-10-CM | POA: Insufficient documentation

## 2015-03-02 DIAGNOSIS — Y9389 Activity, other specified: Secondary | ICD-10-CM | POA: Insufficient documentation

## 2015-03-02 DIAGNOSIS — S46912A Strain of unspecified muscle, fascia and tendon at shoulder and upper arm level, left arm, initial encounter: Secondary | ICD-10-CM | POA: Insufficient documentation

## 2015-03-02 DIAGNOSIS — Z720 Tobacco use: Secondary | ICD-10-CM | POA: Insufficient documentation

## 2015-03-02 DIAGNOSIS — Z792 Long term (current) use of antibiotics: Secondary | ICD-10-CM | POA: Insufficient documentation

## 2015-03-02 DIAGNOSIS — Y998 Other external cause status: Secondary | ICD-10-CM | POA: Insufficient documentation

## 2015-03-02 MED ORDER — IBUPROFEN 800 MG PO TABS: 800.0000 mg | ORAL_TABLET | Freq: Three times a day (TID) | ORAL | Status: DC

## 2015-03-02 NOTE — ED Notes (Signed)
Declined W/C at D/C and was escorted to lobby by RN. 

## 2015-03-02 NOTE — ED Provider Notes (Signed)
CSN: [id number]     Arrival date & time 03/02/15  1246 History   This chart was scribed for non-physician practitioner, Glendell Docker, NP working with Dorie Rank, MD, by Ian Bushman, ED Scribe. This patient was seen in room TR05C/TR05C and the patient's care was started at 1:05 PM.    Chief Complaint  Patient presents with  . Shoulder Pain     (Consider location/radiation/quality/duration/timing/severity/associated sxs/prior Treatment) Patient is a 39 y.o. female presenting with shoulder pain. The history is provided by the patient. No language interpreter was used.  Shoulder Pain   HPI Comments: Tanya Kirk is a 39 y.o. female who presents to the Emergency Department complaining of left shoulder pain onset 1 month ago.  Patient denies any recent fall or injury but was doing some light weight lifting and thinks she may have twisted or pulled a muscle. Patients last menstruation was a  week and a half ago. Patient has no other complaints today.    Past Medical History  Diagnosis Date  . Depression   . Bulimia    History reviewed. No pertinent past surgical history. Family History  Problem Relation Age of Onset  . Cancer Other   . Diabetes Other    History  Substance Use Topics  . Smoking status: Current Some Day Smoker    Types: Cigarettes  . Smokeless tobacco: Never Used  . Alcohol Use: Yes     Comment: occ   OB History    No data available     Review of Systems  Musculoskeletal: Positive for myalgias.  All other systems reviewed and are negative.     Allergies  Tetracyclines & related  Home Medications   Prior to Admission medications   Medication Sig Start Date End Date Taking? Authorizing Provider  amoxicillin (AMOXIL) 500 MG capsule Take 500 mg by mouth 3 (three) times daily.    Historical Provider, MD  meloxicam (MOBIC) 15 MG tablet Take 1 tablet (15 mg total) by mouth daily. 11/22/14   Lance Bosch, NP  metroNIDAZOLE (FLAGYL) 500 MG tablet Take  1 tablet (500 mg total) by mouth 2 (two) times daily. One tab PO bid x 14 days 02/05/15   Elmyra Ricks Pisciotta, PA-C  phenol (CHLORASEPTIC) 1.4 % LIQD Use as directed 1 spray in the mouth or throat as needed for throat irritation / pain.    Historical Provider, MD   BP 119/76 mmHg  Pulse 68  Temp(Src) 98.2 F (36.8 C) (Oral)  Resp 20  SpO2 97%  LMP 02/12/2015 Physical Exam  Constitutional: She is oriented to person, place, and time. She appears well-developed and well-nourished. No distress.  HENT:  Head: Normocephalic and atraumatic.  Eyes: Conjunctivae and EOM are normal.  Neck: Neck supple.  Cardiovascular: Normal rate.   Pulmonary/Chest: Effort normal. No respiratory distress.  Musculoskeletal: Normal range of motion.  Full rom of the left shoulder. Pulses intact. Grip strength equal  Neurological: She is alert and oriented to person, place, and time.  Skin: Skin is warm and dry.  Psychiatric: She has a normal mood and affect. Her behavior is normal.  Nursing note and vitals reviewed.   ED Course  Procedures (including critical care time) DIAGNOSTIC STUDIES: Oxygen Saturation is 97% on RA, normal by my interpretation.    COORDINATION OF CARE: 1:07 PM Discussed treatment plan with patient at beside, the patient agrees with the plan and has no further questions at this time.  Labs Review Labs Reviewed - No data to  display  Imaging Review Dg Shoulder Left  03/02/2015   CLINICAL DATA:  Left shoulder pain for 1 month  EXAM: LEFT SHOULDER - 2+ VIEW  COMPARISON:  None.  FINDINGS: There is no evidence of fracture or dislocation. There is no evidence of arthropathy or other focal bone abnormality. Soft tissues are unremarkable.  IMPRESSION: Negative.   Electronically Signed   By: Conchita Paris M.D.   On: 03/02/2015 14:09     EKG Interpretation None      MDM   Final diagnoses:  Shoulder strain, left, initial encounter   No acute bony abnormality noted. Pt given ibuprofen for  pain and ortho follow up  I personally performed the services described in this documentation, which was scribed in my presence. The recorded information has been reviewed and is accurate.    Glendell Docker, NP 03/02/15 [place], MD 03/04/15 [phone]

## 2015-03-02 NOTE — Discharge Instructions (Signed)

## 2015-03-02 NOTE — ED Notes (Signed)
Pt here for left shoulder pain longer than 1 month. sts hurts when she raises her arm up. sts she was doing heavy lifting prior to this.

## 2015-04-04 ENCOUNTER — Ambulatory Visit: Payer: Self-pay | Attending: Internal Medicine | Admitting: Internal Medicine

## 2015-04-04 ENCOUNTER — Encounter: Payer: Self-pay | Admitting: Internal Medicine

## 2015-04-04 VITALS — BP 108/69 | HR 86 | Temp 98.1°F | Resp 16 | Ht 64.5 in | Wt 161.0 lb

## 2015-04-04 DIAGNOSIS — F1410 Cocaine abuse, uncomplicated: Secondary | ICD-10-CM | POA: Insufficient documentation

## 2015-04-04 DIAGNOSIS — M25511 Pain in right shoulder: Secondary | ICD-10-CM | POA: Insufficient documentation

## 2015-04-04 DIAGNOSIS — F1490 Cocaine use, unspecified, uncomplicated: Secondary | ICD-10-CM

## 2015-04-04 DIAGNOSIS — H538 Other visual disturbances: Secondary | ICD-10-CM | POA: Insufficient documentation

## 2015-04-04 NOTE — Progress Notes (Signed)
Pt denies wanting to have a pap smear. Pt is requesting for the provider to look at her left shoulder. Pt had an xray with negative findings.  Pt states that she cant exercise and limits her movements.

## 2015-04-04 NOTE — Progress Notes (Signed)
   Subjective:    Patient ID: Tanya Kirk, female    DOB: May 10, 1976, 39 y.o.   MRN: [id number]  HPI Comments: The pain is not severe she is more bothered by not being able to complete yoga or lift certain items such as a gallon of milk. Her shoulder feels week.   Shoulder Pain  This is a recurrent problem. The current episode started more than 1 month ago. There has been no history of extremity trauma (was lifting weights at home and then started to have pain). The problem occurs every several days. Pertinent negatives include no fever, joint swelling or limited range of motion. Associated symptoms comments: Weakness of left shoulder Pain with laying on left side . The symptoms are aggravated by activity. She has tried nothing for the symptoms. Family history does not include gout or rheumatoid arthritis. There is no history of diabetes, gout, osteoarthritis or rheumatoid arthritis.      Review of Systems  Constitutional: Negative for fever.  Musculoskeletal: Negative for gout.       Objective:   Physical Exam  Constitutional: She is oriented to person, place, and time.  Cardiovascular: Normal rate, regular rhythm and normal heart sounds.   Pulmonary/Chest: Effort normal and breath sounds normal.  Musculoskeletal: Normal range of motion. She exhibits no tenderness (no swelling of joint space).  Neurological: She is alert and oriented to person, place, and time.  Skin: Skin is warm and dry.  Psychiatric: She has a normal mood and affect.       Assessment & Plan:  Miyako was seen today for follow-up.  Diagnoses and all orders for this visit:  Right shoulder pain Orders: -     Ambulatory referral to Sports Medicine Pt has normal ROM, recent shoulder xray was negative in ER. States that she does not need pain medication  Cocaine use I have addressed drug use with patient and the several complications of use. I have went over resources for patients help. She does not appear to  be interested in quitting today.  Blurred vision Orders: -     Ambulatory referral to Ophthalmology I have explained that she may try [facility] for a vision exam. Went over starting price with patient  Return if symptoms worsen or fail to improve.  Chari Manning 04/04/2015 10:28 AM

## 2015-05-25 ENCOUNTER — Encounter (HOSPITAL_COMMUNITY): Payer: Self-pay | Admitting: Emergency Medicine

## 2015-05-25 ENCOUNTER — Emergency Department (HOSPITAL_COMMUNITY)
Admission: EM | Admit: 2015-05-25 | Discharge: 2015-05-25 | Disposition: A | Discharge status: Home / Self Care | Payer: Self-pay | Attending: Emergency Medicine | Admitting: Emergency Medicine

## 2015-05-25 DIAGNOSIS — Z720 Tobacco use: Secondary | ICD-10-CM | POA: Insufficient documentation

## 2015-05-25 DIAGNOSIS — Z8659 Personal history of other mental and behavioral disorders: Secondary | ICD-10-CM | POA: Insufficient documentation

## 2015-05-25 DIAGNOSIS — N898 Other specified noninflammatory disorders of vagina: Secondary | ICD-10-CM | POA: Insufficient documentation

## 2015-05-25 DIAGNOSIS — Z3202 Encounter for pregnancy test, result negative: Secondary | ICD-10-CM | POA: Insufficient documentation

## 2015-05-25 LAB — I-STAT BETA HCG BLOOD, ED (MC, WL, AP ONLY): I-stat hCG, quantitative: <5 m[IU]/mL (ref <5)

## 2015-05-25 LAB — WET PREP, GENITAL
Clue Cells Wet Prep HPF POC: NONE SEEN
TRICH WET PREP: NONE SEEN
YEAST WET PREP: NONE SEEN

## 2015-05-25 MED ORDER — AZITHROMYCIN 250 MG PO TABS
1000.0000 mg | ORAL_TABLET | Freq: Once | ORAL | Status: DC
  Filled 2015-05-25: qty 4

## 2015-05-25 MED ORDER — CEFTRIAXONE SODIUM 250 MG IJ SOLR
250.0000 mg | Freq: Once | INTRAMUSCULAR | Status: AC
  Administered 2015-05-25: 250 mg via INTRAMUSCULAR
  Filled 2015-05-25: qty 250

## 2015-05-25 MED ORDER — FLUCONAZOLE 100 MG PO TABS
150.0000 mg | ORAL_TABLET | Freq: Once | ORAL | Status: AC
  Administered 2015-05-25: 150 mg via ORAL
  Filled 2015-05-25: qty 2

## 2015-05-25 MED ORDER — ONDANSETRON 4 MG PO TBDP
8.0000 mg | ORAL_TABLET | Freq: Once | ORAL | Status: DC
  Filled 2015-05-25: qty 2

## 2015-05-25 NOTE — Discharge Instructions (Signed)
Read the information below.  You may return to the Emergency Department at any time for worsening condition or any new symptoms that concern you.  If you develop high fevers,  abdominal pain, uncontrolled vomiting, or are unable to tolerate fluids by mouth, return to the ER for a recheck.   You have been treated empirically for a yeast infection and gonorrhea.  If you have any other tests result that require treatment, you will be called. If your gonorrhea test is positive, you will not receive a call.  Please check your medical records through My Chart to find the results.  If positive, your partner will need to be tested and treated.  Please do not engage in any sexual activity until you have the results and everyone has been treated if necessary.  Please go to the health department for regular HIV/STD tests.

## 2015-05-25 NOTE — ED Notes (Signed)
Pt c/o vaginal irritation, vaginal discharge and odor. Pt does have a new sexual partner. Last intercourse was about 1 week ago, but the condom came off and was inside pt. Pt reports symptoms began a few days after.

## 2015-05-25 NOTE — ED Provider Notes (Signed)
CSN: [id number]     Arrival date & time 05/25/15  0914 History   First MD Initiated Contact with Patient 05/25/15 1032     Chief Complaint  Patient presents with  . Vaginal Discharge     (Consider location/radiation/quality/duration/timing/severity/associated sxs/prior Treatment) The history is provided by the patient.     [id number] p/w abnormal vaginal discharge, itching, and odor x 3 days. Had condom break during vaginal intercourse last week.  Has one female partner x 4-5 months.  Denies fevers, chills, abdominal pain, urinary symptoms, change in bowel habits, vaginal bleeding.  LMP 2-3 weeks ago.   She and her partner did not have STD/HIV testing recently.    Past Medical History  Diagnosis Date  . Depression   . Bulimia    History reviewed. No pertinent past surgical history. Family History  Problem Relation Age of Onset  . Cancer Other   . Diabetes Other    History  Substance Use Topics  . Smoking status: Current Some Day Smoker    Types: Cigarettes  . Smokeless tobacco: Never Used  . Alcohol Use: Yes     Comment: occ   OB History    No data available     Review of Systems  Constitutional: Negative for fever and chills.  Gastrointestinal: Negative for nausea, vomiting, abdominal pain, diarrhea and constipation.  Genitourinary: Positive for vaginal discharge. Negative for dysuria, urgency, frequency, vaginal bleeding and menstrual problem.  Musculoskeletal: Negative for back pain.  Skin: Negative for rash.  Allergic/Immunologic: Negative for immunocompromised state.  Psychiatric/Behavioral: Negative for self-injury.      Allergies  Tetracyclines & related  Home Medications   Prior to Admission medications   Medication Sig Start Date End Date Taking? Authorizing Provider  Ibuprofen-Diphenhydramine Cit (ADVIL PM) 200-38 MG TABS Take 0.5-1 tablets by mouth daily as needed (Pain/sleep).   Yes Historical Provider, MD   BP 98/63 mmHg  Pulse 73  Temp(Src) 98.6  F (37 C) (Oral)  Resp 16  Ht 5' 4.5" (1.638 m)  Wt 161 lb (73.029 kg)  BMI 27.22 kg/m2  SpO2 99%  LMP 05/04/2015 Physical Exam  Constitutional: She appears well-developed and well-nourished. No distress.  HENT:  Head: Normocephalic and atraumatic.  Neck: Neck supple.  Pulmonary/Chest: Effort normal.  Abdominal: Soft. She exhibits no distension. There is no tenderness. There is no rebound and no guarding.  Genitourinary: Uterus is tender. Cervix exhibits no motion tenderness. Right adnexum displays no mass, no tenderness and no fullness. Left adnexum displays no mass, no tenderness and no fullness. No erythema, tenderness or bleeding in the vagina. No foreign body around the vagina. No signs of injury around the vagina. Vaginal discharge found.  Small amount thick white discharge.   Neurological: She is alert.  Skin: She is not diaphoretic.  Nursing note and vitals reviewed.   ED Course  Procedures (including critical care time) Labs Review Labs Reviewed  WET PREP, GENITAL - Abnormal; Notable for the following:    WBC, Wet Prep HPF POC FEW (*)    All other components within normal limits  HIV ANTIBODY (ROUTINE TESTING)  RPR  I-STAT BETA HCG BLOOD, ED ([facility], [facility], [facility] ONLY)  GC/CHLAMYDIA PROBE AMP ([facility]) NOT AT [facility]    Imaging Review No results found.   EKG Interpretation None      MDM   Final diagnoses:  Vaginal discharge    Afebrile, nontoxic patient with abnormal vaginal discharge with essentially unprotected sex recently.  Pt initially asked to  be covered for STDs but then decided she didn't want to take too many pills.  She was treated for clinical yeast infection with diflucan and empirically for gonorrhea with IM rocephin.    D/C home with PCP follow up.  Discussed result, findings, treatment, and follow up  with patient.  Pt given return precautions.  Pt verbalizes understanding and agrees with plan.         Clayton Bibles, PA-C 05/25/15 [place], MD 05/25/15 [phone]

## 2015-05-26 LAB — GC/CHLAMYDIA PROBE AMP (~~LOC~~) NOT AT ARMC
CHLAMYDIA, DNA PROBE: NEGATIVE
NEISSERIA GONORRHEA: NEGATIVE

## 2015-05-26 LAB — RPR: RPR Ser Ql: NONREACTIVE

## 2015-05-26 LAB — HIV ANTIBODY (ROUTINE TESTING W REFLEX): HIV SCREEN 4TH GENERATION: NONREACTIVE

## 2015-06-25 ENCOUNTER — Ambulatory Visit: Payer: Self-pay | Attending: Internal Medicine

## 2015-07-02 ENCOUNTER — Ambulatory Visit: Payer: Self-pay | Admitting: Internal Medicine

## 2015-07-11 ENCOUNTER — Emergency Department (HOSPITAL_COMMUNITY)
Admission: EM | Admit: 2015-07-11 | Discharge: 2015-07-11 | Disposition: A | Discharge status: Home / Self Care | Payer: Self-pay | Attending: Emergency Medicine | Admitting: Emergency Medicine

## 2015-07-11 ENCOUNTER — Encounter (HOSPITAL_COMMUNITY): Payer: Self-pay | Admitting: *Deleted

## 2015-07-11 DIAGNOSIS — Z8659 Personal history of other mental and behavioral disorders: Secondary | ICD-10-CM | POA: Insufficient documentation

## 2015-07-11 DIAGNOSIS — Z791 Long term (current) use of non-steroidal anti-inflammatories (NSAID): Secondary | ICD-10-CM | POA: Insufficient documentation

## 2015-07-11 DIAGNOSIS — N898 Other specified noninflammatory disorders of vagina: Secondary | ICD-10-CM | POA: Insufficient documentation

## 2015-07-11 DIAGNOSIS — Z87891 Personal history of nicotine dependence: Secondary | ICD-10-CM | POA: Insufficient documentation

## 2015-07-11 DIAGNOSIS — Z3202 Encounter for pregnancy test, result negative: Secondary | ICD-10-CM | POA: Insufficient documentation

## 2015-07-11 LAB — URINALYSIS, ROUTINE W REFLEX MICROSCOPIC
BILIRUBIN URINE: NEGATIVE
Glucose, UA: NEGATIVE mg/dL
Ketones, ur: NEGATIVE mg/dL
Leukocytes, UA: NEGATIVE
NITRITE: NEGATIVE
Protein, ur: NEGATIVE mg/dL
SPECIFIC GRAVITY, URINE: 1.019 (ref 1.005–1.030)
Urobilinogen, UA: 1 mg/dL (ref 0.0–1.0)
pH: 7.5 (ref 5.0–8.0)

## 2015-07-11 LAB — URINE MICROSCOPIC-ADD ON

## 2015-07-11 LAB — WET PREP, GENITAL
Clue Cells Wet Prep HPF POC: NONE SEEN
Trich, Wet Prep: NONE SEEN
WBC, Wet Prep HPF POC: NONE SEEN
Yeast Wet Prep HPF POC: NONE SEEN

## 2015-07-11 LAB — PREGNANCY, URINE: Preg Test, Ur: NEGATIVE

## 2015-07-11 MED ORDER — METRONIDAZOLE 500 MG PO TABS: 500.0000 mg | ORAL_TABLET | Freq: Two times a day (BID) | ORAL | Status: DC

## 2015-07-11 NOTE — ED Notes (Signed)
Pt reports brown colored vaginal discharge with foul odor. Ongoing for several days. Denies abdominal pain. Denies urinary symptoms.

## 2015-07-11 NOTE — ED Provider Notes (Signed)
CSN: [id number]     Arrival date & time 07/11/15  0815 History   First MD Initiated Contact with Patient 07/11/15 [phone]     Chief Complaint  Patient presents with  . Vaginal Discharge     (Consider location/radiation/quality/duration/timing/severity/associated sxs/prior Treatment) HPI Comments: Patient presents to the ED with a chief complaint of vaginal discharge.  She states that she has been having intermittent vaginal discharge for the past several weeks.  She has a long history of the same.  She states that she has taken flagyl and diflucan in the past and that the flagyl worked, but diflucan hasn't historically.  She describes a foul smelling odor.  She denies any dysuria or hematuria.  She is sexually active with multiple partners, but states that she always uses barrier protection.  She denies any abdominal pain or pelvic pain.  She denies fever, chills, nausea, or vomiting.  The history is provided by the patient. No language interpreter was used.    Past Medical History  Diagnosis Date  . Depression   . Bulimia    History reviewed. No pertinent past surgical history. Family History  Problem Relation Age of Onset  . Cancer Other   . Diabetes Other    History  Substance Use Topics  . Smoking status: Current Some Day Smoker    Types: Cigarettes  . Smokeless tobacco: Never Used  . Alcohol Use: Yes     Comment: occ   OB History    No data available     Review of Systems  Constitutional: Negative for fever and chills.  Respiratory: Negative for shortness of breath.   Cardiovascular: Negative for chest pain.  Gastrointestinal: Negative for nausea, vomiting, diarrhea and constipation.  Genitourinary: Positive for vaginal discharge. Negative for dysuria.  All other systems reviewed and are negative.     Allergies  Tetracyclines & related  Home Medications   Prior to Admission medications   Medication Sig Start Date End Date Taking? Authorizing Provider   Ibuprofen-Diphenhydramine Cit (ADVIL PM) 200-38 MG TABS Take 0.5-1 tablets by mouth daily as needed (Pain/sleep).    Historical Provider, MD   BP 113/70 mmHg  Pulse 72  Temp(Src) 98.1 F (36.7 C) (Oral)  Resp 18  Ht 5\' 5"  (1.651 m)  Wt 160 lb (72.576 kg)  BMI 26.63 kg/m2  SpO2 98%  LMP 06/20/2015 Physical Exam  Constitutional: She is oriented to person, place, and time. She appears well-developed and well-nourished.  HENT:  Head: Normocephalic and atraumatic.  Eyes: Conjunctivae and EOM are normal. Pupils are equal, round, and reactive to light.  Neck: Normal range of motion. Neck supple.  Cardiovascular: Normal rate and regular rhythm.  Exam reveals no gallop and no friction rub.   No murmur heard. Pulmonary/Chest: Effort normal and breath sounds normal. No respiratory distress. She has no wheezes. She has no rales. She exhibits no tenderness.  Abdominal: Soft. Bowel sounds are normal. She exhibits no distension and no mass. There is no tenderness. There is no rebound and no guarding.  Genitourinary:  Pelvic exam chaperoned by female ER tech, no right or left adnexal tenderness, no uterine tenderness, mild white vaginal discharge, no bleeding, no CMT or friability, no foreign body, no injury to the external genitalia, no other significant findings   Musculoskeletal: Normal range of motion. She exhibits no edema or tenderness.  Neurological: She is alert and oriented to person, place, and time.  Skin: Skin is warm and dry.  Psychiatric: She has a normal  mood and affect. Her behavior is normal. Judgment and thought content normal.  Nursing note and vitals reviewed.   ED Course  Procedures (including critical care time) Results for orders placed or performed during the hospital encounter of 07/11/15  Wet prep, genital  Result Value Ref Range   Yeast Wet Prep HPF POC NONE SEEN NONE SEEN   Trich, Wet Prep NONE SEEN NONE SEEN   Clue Cells Wet Prep HPF POC NONE SEEN NONE SEEN    WBC, Wet Prep HPF POC NONE SEEN NONE SEEN  Urinalysis, Routine w reflex microscopic (not at [facility])  Result Value Ref Range   Color, Urine YELLOW YELLOW   APPearance CLEAR CLEAR   Specific Gravity, Urine 1.019 1.005 - 1.030   pH 7.5 5.0 - 8.0   Glucose, UA NEGATIVE NEGATIVE mg/dL   Hgb urine dipstick MODERATE (A) NEGATIVE   Bilirubin Urine NEGATIVE NEGATIVE   Ketones, ur NEGATIVE NEGATIVE mg/dL   Protein, ur NEGATIVE NEGATIVE mg/dL   Urobilinogen, UA 1.0 0.0 - 1.0 mg/dL   Nitrite NEGATIVE NEGATIVE   Leukocytes, UA NEGATIVE NEGATIVE  Pregnancy, urine  Result Value Ref Range   Preg Test, Ur NEGATIVE NEGATIVE  Urine microscopic-add on  Result Value Ref Range   Squamous Epithelial / LPF FEW (A) RARE   WBC, UA 0-2 <3 WBC/hpf   RBC / HPF 3-6 <3 RBC/hpf   Bacteria, UA RARE RARE   Urine-Other MUCOUS PRESENT    No results found.   Imaging Review No results found.   EKG Interpretation None      MDM   Final diagnoses:  Vaginal discharge    Pelvic exam remarkable for mild white vaginal discharge, there is no adnexal or uterine or cervical tenderness.  Labs and UA are reassuring.  Informed patient of results.  She really wants to try flagyl again because it stopped her discharge in the past.  Because the patient states that the symptoms are persistent and flagyl has worked in the past, will give flagyl.  Recommend OBGYN follow-up.    Montine Circle, PA-C 07/11/15 1126  Debby Freiberg, MD 07/12/15 [phone]

## 2015-07-11 NOTE — ED Notes (Signed)
Pt feels she has a bacterial infection.  States brown discharge and that every time she comes here they tell her she has a yeast infection and the medication they give her works for several weeks, but then the symptoms return.  She also states she becomes very irritable - feels she lost her job because she has a yeast infection.

## 2015-07-11 NOTE — ED Notes (Signed)
Pt went to collect UA and dropped cup on floor in RR. Will attempt UA again

## 2015-07-11 NOTE — ED Notes (Signed)
Pelvic cart set up at bedside  

## 2015-07-11 NOTE — Discharge Instructions (Signed)
Bacterial Vaginosis Bacterial vaginosis is a vaginal infection that occurs when the normal balance of bacteria in the vagina is disrupted. It results from an overgrowth of certain bacteria. This is the most common vaginal infection in women of childbearing age. Treatment is important to prevent complications, especially in pregnant women, as it can cause a premature delivery. CAUSES  Bacterial vaginosis is caused by an increase in harmful bacteria that are normally present in smaller amounts in the vagina. Several different kinds of bacteria can cause bacterial vaginosis. However, the reason that the condition develops is not fully understood. RISK FACTORS Certain activities or behaviors can put you at an increased risk of developing bacterial vaginosis, including:  Having a new sex partner or multiple sex partners.  Douching.  Using an intrauterine device (IUD) for contraception. Women do not get bacterial vaginosis from toilet seats, bedding, swimming pools, or contact with objects around them. SIGNS AND SYMPTOMS  Some women with bacterial vaginosis have no signs or symptoms. Common symptoms include:  Grey vaginal discharge.  A fishlike odor with discharge, especially after sexual intercourse.  Itching or burning of the vagina and vulva.  Burning or pain with urination. DIAGNOSIS  Your health care provider will take a medical history and examine the vagina for signs of bacterial vaginosis. A sample of vaginal fluid may be taken. Your health care provider will look at this sample under a microscope to check for bacteria and abnormal cells. A vaginal pH test may also be done.  TREATMENT  Bacterial vaginosis may be treated with antibiotic medicines. These may be given in the form of a pill or a vaginal cream. A second round of antibiotics may be prescribed if the condition comes back after treatment.  HOME CARE INSTRUCTIONS   Only take over-the-counter or prescription medicines as  directed by your health care provider.  If antibiotic medicine was prescribed, take it as directed. Make sure you finish it even if you start to feel better.  Do not have sex until treatment is completed.  Tell all sexual partners that you have a vaginal infection. They should see their health care provider and be treated if they have problems, such as a mild rash or itching.  Practice safe sex by using condoms and only having one sex partner. SEEK MEDICAL CARE IF:   Your symptoms are not improving after 3 days of treatment.  You have increased discharge or pain.  You have a fever. MAKE SURE YOU:   Understand these instructions.  Will watch your condition.  Will get help right away if you are not doing well or get worse. FOR MORE INFORMATION  Centers for Disease Control and Prevention, Division of STD Prevention: www.cdc.gov/std American Sexual Health Association (ASHA): www.ashastd.org  Document Released: 11/22/2005 Document Revised: 09/12/2013 Document Reviewed: 07/04/2013 Exit[employer] Patient Information 2015 [employer]. This information is not intended to replace advice given to you by your health care provider. Make sure you discuss any questions you have with your health care provider.  

## 2015-07-14 LAB — GC/CHLAMYDIA PROBE AMP (~~LOC~~) NOT AT ARMC
CHLAMYDIA, DNA PROBE: NEGATIVE
Neisseria Gonorrhea: NEGATIVE

## 2015-10-11 ENCOUNTER — Encounter (HOSPITAL_COMMUNITY): Payer: Self-pay | Admitting: *Deleted

## 2015-10-11 ENCOUNTER — Inpatient Hospital Stay (HOSPITAL_COMMUNITY)
Admission: AD | Admit: 2015-10-11 | Discharge: 2015-10-11 | Disposition: A | Discharge status: Home / Self Care | Payer: Self-pay | Source: Ambulatory Visit | Attending: Obstetrics and Gynecology | Admitting: Obstetrics and Gynecology

## 2015-10-11 DIAGNOSIS — F17200 Nicotine dependence, unspecified, uncomplicated: Secondary | ICD-10-CM | POA: Insufficient documentation

## 2015-10-11 DIAGNOSIS — N898 Other specified noninflammatory disorders of vagina: Secondary | ICD-10-CM

## 2015-10-11 DIAGNOSIS — R300 Dysuria: Secondary | ICD-10-CM | POA: Insufficient documentation

## 2015-10-11 DIAGNOSIS — R350 Frequency of micturition: Secondary | ICD-10-CM | POA: Insufficient documentation

## 2015-10-11 LAB — URINE MICROSCOPIC-ADD ON

## 2015-10-11 LAB — WET PREP, GENITAL
Clue Cells Wet Prep HPF POC: NONE SEEN
Trich, Wet Prep: NONE SEEN
Yeast Wet Prep HPF POC: NONE SEEN

## 2015-10-11 LAB — URINALYSIS, ROUTINE W REFLEX MICROSCOPIC
Bilirubin Urine: NEGATIVE
GLUCOSE, UA: NEGATIVE mg/dL
KETONES UR: NEGATIVE mg/dL
LEUKOCYTES UA: NEGATIVE
Nitrite: NEGATIVE
PROTEIN: NEGATIVE mg/dL
Specific Gravity, Urine: 1.02 (ref 1.005–1.030)
UROBILINOGEN UA: 1 mg/dL (ref 0.0–1.0)
pH: 7 (ref 5.0–8.0)

## 2015-10-11 NOTE — MAU Provider Note (Signed)
History     CSN: [id number]  Arrival date and time: 10/11/15 1653   First Provider Initiated Contact with Patient 10/11/15 1722      No chief complaint on file.  HPI  Tanya Kirk is a 39 y.o. G1P0010. LMP 11/1. She presents with a bacterial infection. Her vaginal discharge is increased, creamy-can be thin or thick with strong offensive odor, no itching. She has urinary frequency,dysuria and urgency. She has low abd cramps off/on. No GI changes. New partner x 1 m, condoms except once.   OB History    Gravida Para Term Preterm AB TAB SAB Ectopic Multiple Living   1    1 1     0      Past Medical History  Diagnosis Date  . Depression   . Bulimia     History reviewed. No pertinent past surgical history.  Family History  Problem Relation Age of Onset  . Cancer Other   . Diabetes Other     Social History  Substance Use Topics  . Smoking status: Current Some Day Smoker    Types: Cigarettes  . Smokeless tobacco: Never Used  . Alcohol Use: Yes     Comment: occ    Allergies:  Allergies  Allergen Reactions  . Tetracyclines & Related Swelling    Airway involvement    Prescriptions prior to admission  Medication Sig Dispense Refill Last Dose  . Ibuprofen-Diphenhydramine Cit (ADVIL PM) 200-38 MG TABS Take 0.5-1 tablets by mouth daily as needed (Pain/sleep).   07/10/2015 at Unknown time  . metroNIDAZOLE (FLAGYL) 500 MG tablet Take 1 tablet (500 mg total) by mouth 2 (two) times daily. 14 tablet 0     Review of Systems  Constitutional: Negative for fever and chills.  Gastrointestinal: Positive for abdominal pain. Negative for nausea, vomiting, diarrhea and constipation.  Genitourinary: Positive for dysuria, urgency and frequency.       + discharge, odor   Physical Exam   Blood pressure 119/75, pulse 89, temperature 98 F (36.7 C), resp. rate 18, last menstrual period 10/07/2015.  Physical Exam  Nursing note and vitals reviewed. Constitutional: She is oriented to  person, place, and time. She appears well-developed and well-nourished.  Cardiovascular: Normal rate.   Respiratory: Effort normal.  GI: Soft. There is no tenderness.  Genitourinary:  Pelvic exam: Ext gen- nl anatomy, skin intact Vagina- small amt thin white discharge Cx- closed, sl tender Uterus- nl size, non tender Adn- no masses palp, non tender  Musculoskeletal: Normal range of motion.  Neurological: She is alert and oriented to person, place, and time.  Skin: Skin is warm and dry.  Psychiatric: She has a normal mood and affect. Her behavior is normal.    MAU Course  Procedures  MDM Results for orders placed or performed during the hospital encounter of 10/11/15 (from the past 24 hour(s))  Wet prep, genital     Status: Abnormal   Collection Time: 10/11/15  5:30 PM  Result Value Ref Range   Yeast Wet Prep HPF POC NONE SEEN NONE SEEN   Trich, Wet Prep NONE SEEN NONE SEEN   Clue Cells Wet Prep HPF POC NONE SEEN NONE SEEN   WBC, Wet Prep HPF POC FEW (A) NONE SEEN  Urinalysis, Routine w reflex microscopic (not at [facility])     Status: Abnormal   Collection Time: 10/11/15  7:00 PM  Result Value Ref Range   Color, Urine YELLOW YELLOW   APPearance CLEAR CLEAR   Specific Gravity,  Urine 1.020 1.005 - 1.030   pH 7.0 5.0 - 8.0   Glucose, UA NEGATIVE NEGATIVE mg/dL   Hgb urine dipstick TRACE (A) NEGATIVE   Bilirubin Urine NEGATIVE NEGATIVE   Ketones, ur NEGATIVE NEGATIVE mg/dL   Protein, ur NEGATIVE NEGATIVE mg/dL   Urobilinogen, UA 1.0 0.0 - 1.0 mg/dL   Nitrite NEGATIVE NEGATIVE   Leukocytes, UA NEGATIVE NEGATIVE  Urine microscopic-add on     Status: Abnormal   Collection Time: 10/11/15  7:00 PM  Result Value Ref Range   Squamous Epithelial / LPF FEW (A) RARE   WBC, UA 0-2 <3 WBC/hpf   RBC / HPF 0-2 <3 RBC/hpf   Bacteria, UA RARE RARE   Urine-Other AMORPHOUS URATES/PHOSPHATES   Urine and wet prep neg, cultures pending Exam normal, will await results to treat  Assessment and  Plan  Normal exam, will await results to treat  Joycelyn Rua M. 10/11/2015, 5:33 PM

## 2015-10-11 NOTE — MAU Note (Signed)
Pt presents to MAU with complaints of a bacterial infection and states she gets them all the time. Reports an odor with creamy discharge.

## 2015-10-13 LAB — GC/CHLAMYDIA PROBE AMP (~~LOC~~) NOT AT ARMC
Chlamydia: NEGATIVE
Neisseria Gonorrhea: NEGATIVE

## 2015-10-15 ENCOUNTER — Telehealth: Payer: Self-pay

## 2015-10-15 NOTE — Telephone Encounter (Signed)
-----   Message from Lance Bosch, NP sent at 10/15/2015  2:08 PM EST ----- Negative GC/Chlamydia test

## 2015-10-15 NOTE — Telephone Encounter (Signed)
Attempted to contact patient  Patient not available Unable to leave message Mail box is full

## 2015-10-27 ENCOUNTER — Ambulatory Visit: Payer: Self-pay

## 2015-11-28 ENCOUNTER — Emergency Department (HOSPITAL_COMMUNITY)
Admission: EM | Admit: 2015-11-28 | Discharge: 2015-11-28 | Disposition: A | Discharge status: Home / Self Care | Payer: Self-pay | Attending: Emergency Medicine | Admitting: Emergency Medicine

## 2015-11-28 ENCOUNTER — Encounter (HOSPITAL_COMMUNITY): Payer: Self-pay | Admitting: Emergency Medicine

## 2015-11-28 DIAGNOSIS — N898 Other specified noninflammatory disorders of vagina: Secondary | ICD-10-CM | POA: Insufficient documentation

## 2015-11-28 DIAGNOSIS — N946 Dysmenorrhea, unspecified: Secondary | ICD-10-CM

## 2015-11-28 DIAGNOSIS — Z3202 Encounter for pregnancy test, result negative: Secondary | ICD-10-CM | POA: Insufficient documentation

## 2015-11-28 DIAGNOSIS — Z8659 Personal history of other mental and behavioral disorders: Secondary | ICD-10-CM | POA: Insufficient documentation

## 2015-11-28 DIAGNOSIS — F17210 Nicotine dependence, cigarettes, uncomplicated: Secondary | ICD-10-CM | POA: Insufficient documentation

## 2015-11-28 DIAGNOSIS — N926 Irregular menstruation, unspecified: Secondary | ICD-10-CM | POA: Insufficient documentation

## 2015-11-28 LAB — URINALYSIS, ROUTINE W REFLEX MICROSCOPIC
Bilirubin Urine: NEGATIVE
GLUCOSE, UA: NEGATIVE mg/dL
KETONES UR: NEGATIVE mg/dL
LEUKOCYTES UA: NEGATIVE
NITRITE: NEGATIVE
PROTEIN: NEGATIVE mg/dL
Specific Gravity, Urine: 1.035 — ABNORMAL HIGH (ref 1.005–1.030)
pH: 5 (ref 5.0–8.0)

## 2015-11-28 LAB — WET PREP, GENITAL
CLUE CELLS WET PREP: NONE SEEN
Sperm: NONE SEEN
TRICH WET PREP: NONE SEEN
YEAST WET PREP: NONE SEEN

## 2015-11-28 LAB — URINE MICROSCOPIC-ADD ON

## 2015-11-28 LAB — POC URINE PREG, ED: PREG TEST UR: NEGATIVE

## 2015-11-28 MED ORDER — IBUPROFEN 800 MG PO TABS: 800.0000 mg | ORAL_TABLET | Freq: Three times a day (TID) | ORAL | Status: DC

## 2015-11-28 NOTE — Discharge Instructions (Signed)
Take the prescribed medication as directed. You will be notified if your culture results are positive. Follow-up with your OB-GYN in the future for vaginal or pelvic complaints. Return to the ED for new or worsening symptoms.

## 2015-11-28 NOTE — ED Provider Notes (Signed)
CSN: [id number]     Arrival date & time 11/28/15  [id number] History   First MD Initiated Contact with Patient 11/28/15 [phone]     Chief Complaint  Patient presents with  . Vaginal Discharge     (Consider location/radiation/quality/duration/timing/severity/associated sxs/prior Treatment) Patient is a 39 y.o. female presenting with vaginal discharge. The history is provided by the patient and medical records.  Vaginal Discharge    39 year old female with history of depression and bulimia, presenting to the ED for vaginal discharge which began days ago. She has tried using 2 boxes of Monistat thus far which she states has helped with the odor, however continues having discharge.  Denies vaginal irritation or itching.  No dysuria or hematuria.  No abdominal pain or flank pain.  No fever, chills, sweats.  Patient states she has chronic nausea and vomiting as she is bulimic.  When asked about this she states "i like to eat but i hate feeling bloated after".  She states she has been doing this for year, no intention of stopping.  Patient is currently on her menstrual cycle, does have some cramping which is normal for her.  VSS.  Past Medical History  Diagnosis Date  . Depression   . Bulimia    History reviewed. No pertinent past surgical history. Family History  Problem Relation Age of Onset  . Cancer Other   . Diabetes Other    Social History  Substance Use Topics  . Smoking status: Current Some Day Smoker -- 0.10 packs/day    Types: Cigarettes  . Smokeless tobacco: Never Used  . Alcohol Use: Yes     Comment: occ   OB History    Gravida Para Term Preterm AB TAB SAB Ectopic Multiple Living   1    1 1     0     Review of Systems  Genitourinary: Positive for vaginal discharge.  All other systems reviewed and are negative.     Allergies  Tetracyclines & related  Home Medications   Prior to Admission medications   Medication Sig Start Date End Date Taking? Authorizing Provider   Ibuprofen-Diphenhydramine Cit (ADVIL PM) 200-38 MG TABS Take 1 tablet by mouth at bedtime as needed (for sleep).     Historical Provider, MD  loratadine (CLARITIN) 10 MG tablet Take 10 mg by mouth daily as needed for allergies.    Historical Provider, MD  phenylephrine (SUDAFED PE) 10 MG TABS tablet Take 10 mg by mouth every 4 (four) hours as needed (for cough/congestion).    Historical Provider, MD   BP 109/73 mmHg  Pulse 82  Temp(Src) 98.4 F (36.9 C) (Oral)  Resp 16  Ht 5\' 4"  (1.626 m)  Wt 72.576 kg  BMI 27.45 kg/m2  SpO2 99%  LMP 11/26/2015   Physical Exam  Constitutional: She is oriented to person, place, and time. She appears well-developed and well-nourished. No distress.  HENT:  Head: Normocephalic and atraumatic.  Mouth/Throat: Oropharynx is clear and moist.  Eyes: Conjunctivae and EOM are normal. Pupils are equal, round, and reactive to light.  Neck: Normal range of motion. Neck supple.  Cardiovascular: Normal rate, regular rhythm and normal heart sounds.   Pulmonary/Chest: Effort normal and breath sounds normal. No respiratory distress. She has no wheezes.  Abdominal: Soft. Bowel sounds are normal. There is no tenderness. There is no guarding.  Genitourinary: There is no lesion on the right labia. There is no lesion on the left labia. Cervix exhibits no motion tenderness. Right adnexum displays  no tenderness. Left adnexum displays no tenderness. There is bleeding in the vagina. No tenderness in the vagina. No foreign body around the vagina. No vaginal discharge found.  Normal female external genitalia without visible lesions; vaginal bleeding noted, no appreciable discharge; no adnexal or CMT  Musculoskeletal: Normal range of motion.  Neurological: She is alert and oriented to person, place, and time.  Skin: Skin is warm and dry. She is not diaphoretic.  Psychiatric: She has a normal mood and affect.  Nursing note and vitals reviewed.   ED Course  Procedures (including  critical care time) Labs Review Results for orders placed or performed during the hospital encounter of 11/28/15  Wet prep, genital  Result Value Ref Range   Yeast Wet Prep HPF POC NONE SEEN NONE SEEN   Trich, Wet Prep NONE SEEN NONE SEEN   Clue Cells Wet Prep HPF POC NONE SEEN NONE SEEN   WBC, Wet Prep HPF POC MODERATE (A) NONE SEEN   Sperm NONE SEEN   Urinalysis, Routine w reflex microscopic (not at [facility])  Result Value Ref Range   Color, Urine YELLOW YELLOW   APPearance CLEAR CLEAR   Specific Gravity, Urine 1.035 (H) 1.005 - 1.030   pH 5.0 5.0 - 8.0   Glucose, UA NEGATIVE NEGATIVE mg/dL   Hgb urine dipstick MODERATE (A) NEGATIVE   Bilirubin Urine NEGATIVE NEGATIVE   Ketones, ur NEGATIVE NEGATIVE mg/dL   Protein, ur NEGATIVE NEGATIVE mg/dL   Nitrite NEGATIVE NEGATIVE   Leukocytes, UA NEGATIVE NEGATIVE  Urine microscopic-add on  Result Value Ref Range   Squamous Epithelial / LPF 0-5 (A) NONE SEEN   WBC, UA 0-5 0 - 5 WBC/hpf   RBC / HPF 6-30 0 - 5 RBC/hpf   Bacteria, UA FEW (A) NONE SEEN   Urine-Other MUCOUS PRESENT   POC urine preg, ED (not at [facility])  Result Value Ref Range   Preg Test, Ur NEGATIVE NEGATIVE   No results found.    Imaging Review No results found. I have personally reviewed and evaluated these images and lab results as part of my medical decision-making.   EKG Interpretation None      MDM   Final diagnoses:  Vaginal discharge  Menstrual cramps   39 year old female here with vaginal discharge. She is seen here frequently for the same. Patient is afebrile, nontoxic. Abdominal exam is benign. Pelvic exam performed, no evidence of discharge or other abnormal findings. She does have some menstrual bleeding she is currently on her menstrual cycle. She has no adnexal or cervical motion tenderness. UA without signs of infection. Wet prep with moderate WBC's, otherwise negative.  Patient expresses no concern for STD.  Gc/chl swab pending-- she will be notified  if culture results are abnormal.  Rx motrin for cramping.  Strongly encouraged patient to follow-up with her OB-GYN for pelvic/vaginal related complaints in the future.  Discussed plan with patient, he/she acknowledged understanding and agreed with plan of care.  Return precautions given for new or worsening symptoms.  Larene Pickett, PA-C 11/28/15 [place], MD 11/28/15 [phone]

## 2015-11-28 NOTE — ED Notes (Signed)
Pt reports maladorous vaginal discharge, denies itching.  LMP 11-26-15.  Pt reports using 2 rounds of Monistat with some improvement.

## 2015-12-02 LAB — GC/CHLAMYDIA PROBE AMP (~~LOC~~) NOT AT ARMC
Chlamydia: NEGATIVE
NEISSERIA GONORRHEA: NEGATIVE

## 2016-02-16 ENCOUNTER — Ambulatory Visit: Payer: Self-pay | Attending: Internal Medicine | Admitting: Internal Medicine

## 2016-02-16 ENCOUNTER — Encounter: Payer: Self-pay | Admitting: Internal Medicine

## 2016-02-16 VITALS — BP 104/68 | HR 86 | Temp 98.0°F | Resp 16 | Wt 170.0 lb

## 2016-02-16 DIAGNOSIS — N898 Other specified noninflammatory disorders of vagina: Secondary | ICD-10-CM | POA: Insufficient documentation

## 2016-02-16 DIAGNOSIS — Z1272 Encounter for screening for malignant neoplasm of vagina: Secondary | ICD-10-CM | POA: Insufficient documentation

## 2016-02-16 LAB — POCT URINALYSIS DIPSTICK
Glucose, UA: NEGATIVE
KETONES UA: NEGATIVE
Leukocytes, UA: NEGATIVE
Nitrite, UA: NEGATIVE
PH UA: 5.5
Urobilinogen, UA: 0.2

## 2016-02-16 NOTE — Patient Instructions (Signed)
Do not forget to schedule your appointment with [facility] [place] for counseling

## 2016-02-16 NOTE — Progress Notes (Signed)
   Subjective:    Patient ID: Tanya Kirk, female    DOB: January 19, 1976, 40 y.o.   MRN: [id number]  Vaginal Discharge The patient's primary symptoms include vaginal discharge. The current episode started 1 to 4 weeks ago. The problem occurs constantly. The problem has been unchanged. The patient is experiencing no pain. She is not pregnant. Pertinent negatives include no abdominal pain, flank pain, frequency, painful intercourse or rash. The vaginal discharge was white. There has been no bleeding. She is sexually active with multiple partners. It is unknown whether or not her partner has an STD. Her past medical history is significant for an STD.    Review of Systems  Gastrointestinal: Negative for abdominal pain.  Genitourinary: Positive for vaginal discharge. Negative for frequency and flank pain.  Skin: Negative for rash.  All other systems reviewed and are negative.      Objective:   Physical Exam  Abdominal: Hernia confirmed negative in the right inguinal area and confirmed negative in the left inguinal area.  Genitourinary: Vagina normal and uterus normal. There is no rash on the right labia. There is no rash on the left labia. Cervix exhibits no motion tenderness, no discharge and no friability. Right adnexum displays no tenderness. Left adnexum displays no tenderness.      Assessment & Plan:  Tanya Kirk was seen today for vaginal discharge.  Diagnoses and all orders for this visit:  Vaginal discharge -     Urinalysis Dipstick -     Cytology - PAP [facility] Safe sex discussed, condoms dispensed in office.   Return if symptoms worsen or fail to improve.  Lance Bosch, NP 02/18/2016 11:12 AM

## 2016-02-16 NOTE — Progress Notes (Signed)
Patient complains of having a slight vaginal discharge with Odor for the past two weeks

## 2016-02-17 LAB — CYTOLOGY - PAP

## 2016-02-17 LAB — CERVICOVAGINAL ANCILLARY ONLY
Chlamydia: NEGATIVE
Neisseria Gonorrhea: NEGATIVE
WET PREP (BD AFFIRM): NEGATIVE

## 2016-02-18 ENCOUNTER — Telehealth: Payer: Self-pay | Admitting: Clinical

## 2016-02-18 NOTE — Telephone Encounter (Signed)
Attempt to f/u on pt referral to [facility], but voicemail is full, no message left.

## 2016-02-19 ENCOUNTER — Telehealth: Payer: Self-pay

## 2016-02-19 NOTE — Telephone Encounter (Signed)
Tried to contact patient  Patient not available Unable to leave voice mail Mail box is full

## 2016-02-19 NOTE — Telephone Encounter (Signed)
-----   Message from Lance Bosch, NP sent at 02/18/2016 11:13 AM EDT ----- No STD's or vaginal infections. Discuss safe sex with patient and condom use. Still waiting on cytology results

## 2016-02-24 ENCOUNTER — Telehealth: Payer: Self-pay

## 2016-02-24 NOTE — Telephone Encounter (Signed)
Tried to contact patient  Patient not available Message left with family member for her to return our call

## 2016-02-24 NOTE — Telephone Encounter (Signed)
-----   Message from Lance Bosch, NP sent at 02/24/2016  3:31 PM EDT ----- Normal pap cytology. May repeat in 3 years

## 2016-06-25 ENCOUNTER — Encounter (HOSPITAL_COMMUNITY): Payer: Self-pay | Admitting: Emergency Medicine

## 2016-06-25 ENCOUNTER — Emergency Department (HOSPITAL_COMMUNITY)
Admission: EM | Admit: 2016-06-25 | Discharge: 2016-06-25 | Disposition: A | Discharge status: Home / Self Care | Payer: Self-pay | Attending: Emergency Medicine | Admitting: Emergency Medicine

## 2016-06-25 DIAGNOSIS — N390 Urinary tract infection, site not specified: Secondary | ICD-10-CM | POA: Insufficient documentation

## 2016-06-25 DIAGNOSIS — N898 Other specified noninflammatory disorders of vagina: Secondary | ICD-10-CM | POA: Insufficient documentation

## 2016-06-25 DIAGNOSIS — R300 Dysuria: Secondary | ICD-10-CM | POA: Insufficient documentation

## 2016-06-25 DIAGNOSIS — F17210 Nicotine dependence, cigarettes, uncomplicated: Secondary | ICD-10-CM | POA: Insufficient documentation

## 2016-06-25 DIAGNOSIS — Z79899 Other long term (current) drug therapy: Secondary | ICD-10-CM | POA: Insufficient documentation

## 2016-06-25 LAB — URINALYSIS, ROUTINE W REFLEX MICROSCOPIC
BILIRUBIN URINE: NEGATIVE
Glucose, UA: NEGATIVE mg/dL
KETONES UR: 15 mg/dL — AB
NITRITE: NEGATIVE
PH: 5.5 (ref 5.0–8.0)
PROTEIN: NEGATIVE mg/dL
Specific Gravity, Urine: 1.031 — ABNORMAL HIGH (ref 1.005–1.030)

## 2016-06-25 LAB — URINE MICROSCOPIC-ADD ON

## 2016-06-25 LAB — WET PREP, GENITAL
CLUE CELLS WET PREP: NONE SEEN
Sperm: NONE SEEN
TRICH WET PREP: NONE SEEN
Yeast Wet Prep HPF POC: NONE SEEN

## 2016-06-25 LAB — POC URINE PREG, ED: Preg Test, Ur: NEGATIVE

## 2016-06-25 MED ORDER — CEFTRIAXONE SODIUM 250 MG IJ SOLR
250.0000 mg | Freq: Once | INTRAMUSCULAR | Status: AC
  Administered 2016-06-25: 250 mg via INTRAMUSCULAR
  Filled 2016-06-25: qty 250

## 2016-06-25 MED ORDER — NITROFURANTOIN MONOHYD MACRO 100 MG PO CAPS: 100.0000 mg | ORAL_CAPSULE | Freq: Two times a day (BID) | ORAL | Status: DC

## 2016-06-25 MED ORDER — AZITHROMYCIN 1 G PO PACK
1.0000 g | PACK | Freq: Once | ORAL | Status: AC
  Administered 2016-06-25: 1 g via ORAL
  Filled 2016-06-25: qty 1

## 2016-06-25 NOTE — ED Provider Notes (Signed)
CSN: [id number]     Arrival date & time 06/25/16  1304 History   First MD Initiated Contact with Patient 06/25/16 1510     Chief Complaint  Patient presents with  . Vaginal Discharge     (Consider location/radiation/quality/duration/timing/severity/associated sxs/prior Treatment) HPI   Patient is a 40 year old female with a history of depression and bulimia presents the ED with vaginal itching and dysuria for the last 3-4 days. Associated slight suprapubic cramping that is intermittent, nonradiating, 9/10. She is also complaining of intermittent white vaginal discharge for roughly 1 month.She states 2-3 episodes of unprotected sex prior to onset of symptoms. Patient denies flank pain, fever, chills, nausea, vomiting, headache, dizziness, hematuria, changes in bowel habits, blood in her stool.  Past Medical History  Diagnosis Date  . Depression   . Bulimia    History reviewed. No pertinent past surgical history. Family History  Problem Relation Age of Onset  . Cancer Other   . Diabetes Other    Social History  Substance Use Topics  . Smoking status: Current Some Day Smoker -- 0.10 packs/day    Types: Cigarettes  . Smokeless tobacco: Never Used  . Alcohol Use: Yes     Comment: occ   OB History    Gravida Para Term Preterm AB TAB SAB Ectopic Multiple Living   1    1 1     0     Review of Systems  Constitutional: Negative for fever and chills.  Respiratory: Negative for shortness of breath.   Cardiovascular: Negative for chest pain.  Gastrointestinal: Positive for abdominal pain. Negative for nausea, vomiting, diarrhea, constipation and blood in stool.  Genitourinary: Positive for dysuria and vaginal discharge. Negative for hematuria and flank pain.  Musculoskeletal: Negative for back pain and neck pain.  Skin: Negative for rash.      Allergies  Tetracyclines & related and Other  Home Medications   Prior to Admission medications   Medication Sig Start Date End Date  Taking? Authorizing Provider  Cetirizine HCl 10 MG CAPS Take 1 capsule by mouth daily as needed. For allergies   Yes Historical Provider, MD  Ibuprofen-Diphenhydramine HCl (ADVIL PM) 200-25 MG CAPS Take 1 tablet by mouth daily as needed. For sleep   Yes Historical Provider, MD  ibuprofen (ADVIL,MOTRIN) 800 MG tablet Take 1 tablet (800 mg total) by mouth 3 (three) times daily. Patient not taking: Reported on 06/25/2016 11/28/15   Larene Pickett, PA-C  nitrofurantoin, macrocrystal-monohydrate, (MACROBID) 100 MG capsule Take 1 capsule (100 mg total) by mouth 2 (two) times daily. 06/25/16   [place] Enke L Haizlee Henton, PA   BP 110/80 mmHg  Pulse 74  Temp(Src) 98.4 F (36.9 C) (Oral)  Resp 15  SpO2 99% Physical Exam  Constitutional: She appears well-developed and well-nourished. No distress.  HENT:  Head: Normocephalic and atraumatic.  Eyes: Conjunctivae are normal.  Cardiovascular: Normal rate, regular rhythm and normal heart sounds.  Exam reveals no gallop and no friction rub.   No murmur heard. Pulmonary/Chest: Effort normal and breath sounds normal. No respiratory distress. She has no wheezes. She has no rales.  Abdominal: Soft. Normal appearance and bowel sounds are normal. She exhibits no distension. There is no tenderness. There is no rigidity, no guarding and no CVA tenderness.  Genitourinary:  Exam performed by Kalman Drape,  exam chaperoned Date: 06/25/2016 Pelvic exam: normal external genitalia without evidence of trauma. VULVA: normal appearing vulva with no masses, tenderness or lesion. VAGINA: normal appearing vagina with normal color  and discharge, no lesions. CERVIX: normal appearing cervix without lesions, cervical motion tenderness absent, cervical os closed with out purulent discharge; vaginal discharge - bloody, Wet prep and DNA probe for chlamydia and GC obtained.   ADNEXA: normal adnexa in size, nontender and no masses UTERUS: uterus is normal size, shape, consistency and  nontender.    Musculoskeletal: Normal range of motion.  Neurological: She is alert. Coordination normal.  Skin: Skin is warm and dry. She is not diaphoretic.  Psychiatric: She has a normal mood and affect. Her behavior is normal.  Nursing note and vitals reviewed.   ED Course  Procedures (including critical care time) Labs Review Labs Reviewed  WET PREP, GENITAL - Abnormal; Notable for the following:    WBC, Wet Prep HPF POC FEW (*)    All other components within normal limits  URINALYSIS, ROUTINE W REFLEX MICROSCOPIC (NOT AT [facility]) - Abnormal; Notable for the following:    APPearance CLOUDY (*)    Specific Gravity, Urine 1.031 (*)    Hgb urine dipstick MODERATE (*)    Ketones, ur 15 (*)    Leukocytes, UA SMALL (*)    All other components within normal limits  URINE MICROSCOPIC-ADD ON - Abnormal; Notable for the following:    Squamous Epithelial / LPF 0-5 (*)    Bacteria, UA RARE (*)    Crystals CA OXALATE CRYSTALS (*)    All other components within normal limits  HIV ANTIBODY (ROUTINE TESTING)  RPR  POC URINE PREG, ED  GC/CHLAMYDIA PROBE AMP ([place]) NOT AT [facility]    Imaging Review No results found. I have personally reviewed and evaluated these images and lab results as part of my medical decision-making.   EKG Interpretation None      MDM   Final diagnoses:  Dysuria  UTI (lower urinary tract infection)  Vaginal discharge  Vaginal itching   Patient to be discharged with instructions to follow up with OBGYN. Patient states she has an appointment with her primary care provider Monday. Discussed importance of using protection when sexually active. Pt understands that they have GC/Chlamydia cultures pending and that they will need to inform all sexual partners if results return positive. Pt has been treated prophylacticly with azithromycin and rocephin due to pts history, pelvic exam, and wet prep with WBCs. Pt not concerning for PID because hemodynamically stable  and no cervical motion tenderness on pelvic exam.   Pt has been diagnosed with a UTI. Pt is afebrile, no CVA tenderness, normotensive, and denies N/V. Pt to be dc home with antibiotics and instructions to follow up with PCP if symptoms persist.  Discussed strict return precautions. Patient expresses understanding to the discharge instructions.      Kalman Drape, PA 06/25/16 1917   Tanna Furry, MD 07/03/16 [phone]

## 2016-06-25 NOTE — ED Notes (Signed)
Vag d/c x 2 months  Hurts to void also

## 2016-06-25 NOTE — Discharge Instructions (Signed)
You have been treated for gonorrhea and chlamydia. The test for these STDs are still pending along with HIV and syphilis. Take the Macrobid as prescribed. Be sure to use condoms during sexual intercourse.  Return to emergency department if you experience fevers, chills, abdominal pain, nausea, vomiting or any other concerning symptoms.  Dysuria Dysuria is pain or discomfort while urinating. The pain or discomfort may be felt in the tube that carries urine out of the bladder (urethra) or in the surrounding tissue of the genitals. The pain may also be felt in the groin area, lower abdomen, and lower back. You may have to urinate frequently or have the sudden feeling that you have to urinate (urgency). Dysuria can affect both men and women, but is more common in women. Dysuria can be caused by many different things, including:  Urinary tract infection in women.  Infection of the kidney or bladder.  Kidney stones or bladder stones.  Certain sexually transmitted infections (STIs), such as chlamydia.  Dehydration.  Inflammation of the vagina.  Use of certain medicines.  Use of certain soaps or scented products that cause irritation. HOME CARE INSTRUCTIONS Watch your dysuria for any changes. The following actions may help to reduce any discomfort you are feeling:  Drink enough fluid to keep your urine clear or pale yellow.  Empty your bladder often. Avoid holding urine for long periods of time.  After a bowel movement or urination, women should cleanse from front to back, using each tissue only once.  Empty your bladder after sexual intercourse.  Take medicines only as directed by your health care provider.  If you were prescribed an antibiotic medicine, finish it all even if you start to feel better.  Avoid caffeine, tea, and alcohol. They can irritate the bladder and make dysuria worse. In men, alcohol may irritate the prostate.  Keep all follow-up visits as directed by your health  care provider. This is important.  If you had any tests done to find the cause of dysuria, it is your responsibility to obtain your test results. Ask the lab or department performing the test when and how you will get your results. Talk with your health care provider if you have any questions about your results. SEEK MEDICAL CARE IF:  You develop pain in your back or sides.  You have a fever.  You have nausea or vomiting.  You have blood in your urine.  You are not urinating as often as you usually do. SEEK IMMEDIATE MEDICAL CARE IF:  You pain is severe and not relieved with medicines.  You are unable to hold down any fluids.  You or someone else notices a change in your mental function.  You have a rapid heartbeat at rest.  You have shaking or chills.  You feel extremely weak.   This information is not intended to replace advice given to you by your health care provider. Make sure you discuss any questions you have with your health care provider.   Document Released: 08/20/2004 Document Revised: 12/13/2014 Document Reviewed: 07/18/2014 Elsevier Interactive Patient Education 2016 [employer]  Urinary Tract Infection Urinary tract infections (UTIs) can develop anywhere along your urinary tract. Your urinary tract is your body's drainage system for removing wastes and extra water. Your urinary tract includes two kidneys, two ureters, a bladder, and a urethra. Your kidneys are a pair of bean-shaped organs. Each kidney is about the size of your fist. They are located below your ribs, one on each side of  your spine. CAUSES Infections are caused by microbes, which are microscopic organisms, including fungi, viruses, and bacteria. These organisms are so small that they can only be seen through a microscope. Bacteria are the microbes that most commonly cause UTIs. SYMPTOMS  Symptoms of UTIs may vary by age and gender of the patient and by the location of the infection. Symptoms in  young women typically include a frequent and intense urge to urinate and a painful, burning feeling in the bladder or urethra during urination. Older women and men are more likely to be tired, shaky, and weak and have muscle aches and abdominal pain. A fever may mean the infection is in your kidneys. Other symptoms of a kidney infection include pain in your back or sides below the ribs, nausea, and vomiting. DIAGNOSIS To diagnose a UTI, your caregiver will ask you about your symptoms. Your caregiver will also ask you to provide a urine sample. The urine sample will be tested for bacteria and white blood cells. White blood cells are made by your body to help fight infection. TREATMENT  Typically, UTIs can be treated with medication. Because most UTIs are caused by a bacterial infection, they usually can be treated with the use of antibiotics. The choice of antibiotic and length of treatment depend on your symptoms and the type of bacteria causing your infection. HOME CARE INSTRUCTIONS  If you were prescribed antibiotics, take them exactly as your caregiver instructs you. Finish the medication even if you feel better after you have only taken some of the medication.  Drink enough water and fluids to keep your urine clear or pale yellow.  Avoid caffeine, tea, and carbonated beverages. They tend to irritate your bladder.  Empty your bladder often. Avoid holding urine for long periods of time.  Empty your bladder before and after sexual intercourse.  After a bowel movement, women should cleanse from front to back. Use each tissue only once. SEEK MEDICAL CARE IF:   You have back pain.  You develop a fever.  Your symptoms do not begin to resolve within 3 days. SEEK IMMEDIATE MEDICAL CARE IF:   You have severe back pain or lower abdominal pain.  You develop chills.  You have nausea or vomiting.  You have continued burning or discomfort with urination. MAKE SURE YOU:   Understand these  instructions.  Will watch your condition.  Will get help right away if you are not doing well or get worse.   This information is not intended to replace advice given to you by your health care provider. Make sure you discuss any questions you have with your health care provider.   Document Released: 09/01/2005 Document Revised: 08/13/2015 Document Reviewed: 12/31/2011 Elsevier Interactive Patient Education [employer].

## 2016-06-26 LAB — HIV ANTIBODY (ROUTINE TESTING W REFLEX): HIV SCREEN 4TH GENERATION: NONREACTIVE

## 2016-06-26 LAB — RPR: RPR Ser Ql: NONREACTIVE

## 2016-06-28 LAB — GC/CHLAMYDIA PROBE AMP (~~LOC~~) NOT AT ARMC
CHLAMYDIA, DNA PROBE: NEGATIVE
NEISSERIA GONORRHEA: NEGATIVE

## 2016-09-10 DIAGNOSIS — F17210 Nicotine dependence, cigarettes, uncomplicated: Secondary | ICD-10-CM | POA: Diagnosis present

## 2016-09-10 DIAGNOSIS — Z881 Allergy status to other antibiotic agents status: Secondary | ICD-10-CM

## 2016-09-10 DIAGNOSIS — Z9109 Other allergy status, other than to drugs and biological substances: Secondary | ICD-10-CM

## 2016-09-10 DIAGNOSIS — K810 Acute cholecystitis: Principal | ICD-10-CM | POA: Diagnosis present

## 2016-09-10 NOTE — ED Triage Notes (Addendum)
Patient states that she is having pain to her right upper abdominal area and her back. Patient is spastic in triage jumping around and tearful. Patient states that she vomited x1. Patient is yelling in triage "i think I have a STD and its cramping its cramping" in triage

## 2016-09-11 ENCOUNTER — Inpatient Hospital Stay (HOSPITAL_BASED_OUTPATIENT_CLINIC_OR_DEPARTMENT_OTHER)
Admission: EM | Admit: 2016-09-11 | Discharge: 2016-09-14 | DRG: 419 | Disposition: A | Discharge status: Home / Self Care | Payer: Self-pay | Attending: General Surgery | Admitting: General Surgery

## 2016-09-11 ENCOUNTER — Emergency Department (HOSPITAL_BASED_OUTPATIENT_CLINIC_OR_DEPARTMENT_OTHER): Payer: Self-pay

## 2016-09-11 ENCOUNTER — Emergency Department (HOSPITAL_COMMUNITY): Payer: Self-pay

## 2016-09-11 ENCOUNTER — Encounter (HOSPITAL_BASED_OUTPATIENT_CLINIC_OR_DEPARTMENT_OTHER): Payer: Self-pay | Admitting: Emergency Medicine

## 2016-09-11 DIAGNOSIS — K819 Cholecystitis, unspecified: Secondary | ICD-10-CM | POA: Diagnosis present

## 2016-09-11 DIAGNOSIS — K810 Acute cholecystitis: Secondary | ICD-10-CM

## 2016-09-11 DIAGNOSIS — R1011 Right upper quadrant pain: Secondary | ICD-10-CM

## 2016-09-11 LAB — RAPID URINE DRUG SCREEN, HOSP PERFORMED
Amphetamines: NOT DETECTED
BARBITURATES: NOT DETECTED
Benzodiazepines: NOT DETECTED
Cocaine: POSITIVE — AB
Opiates: NOT DETECTED
TETRAHYDROCANNABINOL: NOT DETECTED

## 2016-09-11 LAB — COMPREHENSIVE METABOLIC PANEL
ALBUMIN: 4.4 g/dL (ref 3.5–5.0)
ALK PHOS: 57 U/L (ref 38–126)
ALT: 21 U/L (ref 14–54)
ANION GAP: 7 (ref 5–15)
AST: 22 U/L (ref 15–41)
BILIRUBIN TOTAL: 0.1 mg/dL — AB (ref 0.3–1.2)
BUN: 13 mg/dL (ref 6–20)
CALCIUM: 9.4 mg/dL (ref 8.9–10.3)
CO2: 24 mmol/L (ref 22–32)
Chloride: 105 mmol/L (ref 101–111)
Creatinine, Ser: 0.67 mg/dL (ref 0.44–1.00)
GFR calc Af Amer: >60 mL/min (ref >60)
GFR calc non Af Amer: >60 mL/min (ref >60)
GLUCOSE: 141 mg/dL — AB (ref 65–99)
POTASSIUM: 3.7 mmol/L (ref 3.5–5.1)
SODIUM: 136 mmol/L (ref 135–145)
Total Protein: 8.2 g/dL — ABNORMAL HIGH (ref 6.5–8.1)

## 2016-09-11 LAB — URINALYSIS, ROUTINE W REFLEX MICROSCOPIC
Bilirubin Urine: NEGATIVE
Glucose, UA: NEGATIVE mg/dL
Ketones, ur: NEGATIVE mg/dL
Leukocytes, UA: NEGATIVE
Nitrite: NEGATIVE
Protein, ur: NEGATIVE mg/dL
SPECIFIC GRAVITY, URINE: 1.028 (ref 1.005–1.030)
pH: 6 (ref 5.0–8.0)

## 2016-09-11 LAB — PREGNANCY, URINE: PREG TEST UR: NEGATIVE

## 2016-09-11 LAB — CBC WITH DIFFERENTIAL/PLATELET
Basophils Absolute: 0 10*3/uL (ref 0.0–0.1)
Basophils Relative: 0 %
Eosinophils Absolute: 0 10*3/uL (ref 0.0–0.7)
Eosinophils Relative: 0 %
HEMATOCRIT: 40.5 % (ref 36.0–46.0)
HEMOGLOBIN: 13.5 g/dL (ref 12.0–15.0)
LYMPHS ABS: 1.1 10*3/uL (ref 0.7–4.0)
Lymphocytes Relative: 11 %
MCH: 29 pg (ref 26.0–34.0)
MCHC: 33.3 g/dL (ref 30.0–36.0)
MCV: 87.1 fL (ref 78.0–100.0)
MONO ABS: 0.5 10*3/uL (ref 0.1–1.0)
MONOS PCT: 5 %
NEUTROS ABS: 7.9 10*3/uL — AB (ref 1.7–7.7)
NEUTROS PCT: 84 %
Platelets: 384 10*3/uL (ref 150–400)
RBC: 4.65 MIL/uL (ref 3.87–5.11)
RDW: 12.6 % (ref 11.5–15.5)
WBC: 9.5 10*3/uL (ref 4.0–10.5)

## 2016-09-11 LAB — LIPASE, BLOOD: Lipase: 22 U/L (ref 11–51)

## 2016-09-11 LAB — URINE MICROSCOPIC-ADD ON

## 2016-09-11 LAB — TROPONIN I

## 2016-09-11 LAB — WET PREP, GENITAL
CLUE CELLS WET PREP: NONE SEEN
Sperm: NONE SEEN
Trich, Wet Prep: NONE SEEN
Yeast Wet Prep HPF POC: NONE SEEN

## 2016-09-11 LAB — SURGICAL PCR SCREEN
MRSA, PCR: NEGATIVE
STAPHYLOCOCCUS AUREUS: NEGATIVE

## 2016-09-11 MED ORDER — SODIUM CHLORIDE 0.9 % IV SOLN
Freq: Once | INTRAVENOUS | Status: AC
  Administered 2016-09-11: 06:00:00 via INTRAVENOUS

## 2016-09-11 MED ORDER — SENNOSIDES-DOCUSATE SODIUM 8.6-50 MG PO TABS: 1.0000 | ORAL_TABLET | Freq: Every evening | ORAL | Status: DC | PRN

## 2016-09-11 MED ORDER — MORPHINE SULFATE (PF) 4 MG/ML IV SOLN
4.0000 mg | Freq: Once | INTRAVENOUS | Status: AC
  Administered 2016-09-11: 4 mg via INTRAVENOUS
  Filled 2016-09-11: qty 1

## 2016-09-11 MED ORDER — AZITHROMYCIN 250 MG PO TABS
1000.0000 mg | ORAL_TABLET | Freq: Once | ORAL | Status: AC
  Administered 2016-09-11: 1000 mg via ORAL
  Filled 2016-09-11: qty 4

## 2016-09-11 MED ORDER — NITROFURANTOIN MONOHYD MACRO 100 MG PO CAPS
100.0000 mg | ORAL_CAPSULE | Freq: Two times a day (BID) | ORAL | Status: DC
  Administered 2016-09-11 (×2): 100 mg via ORAL
  Filled 2016-09-11 (×3): qty 1

## 2016-09-11 MED ORDER — CEFTRIAXONE SODIUM 1 G IJ SOLR
1.0000 g | Freq: Once | INTRAMUSCULAR | Status: AC
  Administered 2016-09-11: 1 g via INTRAMUSCULAR
  Filled 2016-09-11: qty 10

## 2016-09-11 MED ORDER — ONDANSETRON HCL 4 MG/2ML IJ SOLN: 4.0000 mg | Freq: Four times a day (QID) | INTRAMUSCULAR | Status: DC | PRN

## 2016-09-11 MED ORDER — DEXTROSE 5 % IV SOLN
2.0000 g | INTRAVENOUS | Status: DC
  Administered 2016-09-12: 2 g via INTRAVENOUS
  Filled 2016-09-11: qty 2

## 2016-09-11 MED ORDER — DIPHENHYDRAMINE HCL 50 MG/ML IJ SOLN: 25.0000 mg | Freq: Four times a day (QID) | INTRAMUSCULAR | Status: DC | PRN

## 2016-09-11 MED ORDER — FENTANYL CITRATE (PF) 100 MCG/2ML IJ SOLN
50.0000 ug | Freq: Once | INTRAMUSCULAR | Status: AC
  Administered 2016-09-11: 50 ug via INTRAVENOUS

## 2016-09-11 MED ORDER — ONDANSETRON HCL 4 MG/2ML IJ SOLN
INTRAMUSCULAR | Status: AC
  Administered 2016-09-11: 4 mg via INTRAVENOUS
  Filled 2016-09-11: qty 2

## 2016-09-11 MED ORDER — ONDANSETRON 4 MG PO TBDP: 4.0000 mg | ORAL_TABLET | Freq: Four times a day (QID) | ORAL | Status: DC | PRN

## 2016-09-11 MED ORDER — HYDROMORPHONE HCL 1 MG/ML IJ SOLN
1.0000 mg | Freq: Once | INTRAMUSCULAR | Status: AC
  Administered 2016-09-11: 1 mg via INTRAVENOUS
  Filled 2016-09-11: qty 1

## 2016-09-11 MED ORDER — KCL IN DEXTROSE-NACL 20-5-0.45 MEQ/L-%-% IV SOLN
INTRAVENOUS | Status: DC
  Administered 2016-09-11: 14:00:00 via INTRAVENOUS
  Filled 2016-09-11 (×2): qty 1000

## 2016-09-11 MED ORDER — FENTANYL CITRATE (PF) 100 MCG/2ML IJ SOLN
INTRAMUSCULAR | Status: AC
  Administered 2016-09-11: 50 ug via INTRAVENOUS
  Filled 2016-09-11: qty 2

## 2016-09-11 MED ORDER — MORPHINE SULFATE (PF) 2 MG/ML IV SOLN
2.0000 mg | INTRAVENOUS | Status: DC | PRN
  Administered 2016-09-11: 2 mg via INTRAVENOUS
  Administered 2016-09-11: 4 mg via INTRAVENOUS
  Administered 2016-09-11 (×2): 2 mg via INTRAVENOUS
  Administered 2016-09-12: 4 mg via INTRAVENOUS
  Filled 2016-09-11: qty 2
  Filled 2016-09-11: qty 1
  Filled 2016-09-11: qty 2
  Filled 2016-09-11 (×2): qty 1

## 2016-09-11 MED ORDER — IOPAMIDOL (ISOVUE-300) INJECTION 61%
100.0000 mL | Freq: Once | INTRAVENOUS | Status: AC | PRN
  Administered 2016-09-11: 100 mL via INTRAVENOUS

## 2016-09-11 MED ORDER — LIDOCAINE HCL (PF) 1 % IJ SOLN
INTRAMUSCULAR | Status: AC
  Administered 2016-09-11: 5 mL
  Filled 2016-09-11: qty 5

## 2016-09-11 MED ORDER — HYDROCODONE-ACETAMINOPHEN 5-325 MG PO TABS: 1.0000 | ORAL_TABLET | ORAL | Status: DC | PRN

## 2016-09-11 MED ORDER — ONDANSETRON 4 MG PO TBDP
4.0000 mg | ORAL_TABLET | Freq: Once | ORAL | Status: AC
  Administered 2016-09-11: 4 mg via ORAL
  Filled 2016-09-11: qty 1

## 2016-09-11 MED ORDER — DEXTROSE 5 % IV SOLN
2.0000 g | Freq: Once | INTRAVENOUS | Status: AC
  Administered 2016-09-11: 2 g via INTRAVENOUS
  Filled 2016-09-11: qty 2

## 2016-09-11 MED ORDER — ONDANSETRON HCL 4 MG/2ML IJ SOLN
4.0000 mg | Freq: Once | INTRAMUSCULAR | Status: AC
  Administered 2016-09-11: 4 mg via INTRAVENOUS
  Filled 2016-09-11: qty 2

## 2016-09-11 MED ORDER — ONDANSETRON HCL 4 MG/2ML IJ SOLN
4.0000 mg | Freq: Once | INTRAMUSCULAR | Status: AC
  Administered 2016-09-11: 4 mg via INTRAVENOUS

## 2016-09-11 MED ORDER — DIPHENHYDRAMINE HCL 25 MG PO CAPS: 25.0000 mg | ORAL_CAPSULE | Freq: Four times a day (QID) | ORAL | Status: DC | PRN

## 2016-09-11 MED ORDER — ENOXAPARIN SODIUM 40 MG/0.4ML ~~LOC~~ SOLN
40.0000 mg | Freq: Every day | SUBCUTANEOUS | Status: DC
  Administered 2016-09-11: 40 mg via SUBCUTANEOUS
  Filled 2016-09-11: qty 0.4

## 2016-09-11 NOTE — H&P (Signed)
Tanya Kirk is an 40 y.o. female.   Chief Complaint: RUQ pain radiating to the back HPI: 40 y.o. F with RUQ pain that has been present for ~1 month.  This worsened last night after eating pizza and began to radiate to her back.  She presented to [name] for evaluation.  She denies fevers.  She has mild nausea.  Denies diarrhea.  Past Medical History:  Diagnosis Date  . Bulimia   . Depression     History reviewed. No pertinent surgical history.  Family History  Problem Relation Age of Onset  . Cancer Other   . Diabetes Other    Social History:  reports that she has been smoking Cigarettes.  She has been smoking about 0.10 packs per day. She has never used smokeless tobacco. She reports that she drinks alcohol. She reports that she uses drugs, including Cocaine and Marijuana.  Allergies:  Allergies  Allergen Reactions  . Tetracyclines & Related Itching, Swelling and Other (See Comments)    Airway involvement.    . Other     Dogs: Itching      (Not in a hospital admission)  Results for orders placed or performed during the hospital encounter of 09/11/16 (from the past 48 hour(s))  Urinalysis, Routine w reflex microscopic (not at [facility])     Status: Abnormal   Collection Time: 09/11/16 12:08 AM  Result Value Ref Range   Color, Urine YELLOW YELLOW   APPearance CLEAR CLEAR   Specific Gravity, Urine 1.028 1.005 - 1.030   pH 6.0 5.0 - 8.0   Glucose, UA NEGATIVE NEGATIVE mg/dL   Hgb urine dipstick LARGE (A) NEGATIVE   Bilirubin Urine NEGATIVE NEGATIVE   Ketones, ur NEGATIVE NEGATIVE mg/dL   Protein, ur NEGATIVE NEGATIVE mg/dL   Nitrite NEGATIVE NEGATIVE   Leukocytes, UA NEGATIVE NEGATIVE  Pregnancy, urine     Status: None   Collection Time: 09/11/16 12:08 AM  Result Value Ref Range   Preg Test, Ur NEGATIVE NEGATIVE    Comment:        THE SENSITIVITY OF THIS METHODOLOGY IS >20 mIU/mL.   Urine microscopic-add on     Status: Abnormal   Collection Time: 09/11/16 12:08 AM   Result Value Ref Range   Squamous Epithelial / LPF 0-5 (A) NONE SEEN   WBC, UA 0-5 0 - 5 WBC/hpf   RBC / HPF 6-30 0 - 5 RBC/hpf   Bacteria, UA FEW (A) NONE SEEN   Urine-Other MUCOUS PRESENT   Urine rapid drug screen (hosp performed)     Status: Abnormal   Collection Time: 09/11/16 12:08 AM  Result Value Ref Range   Opiates NONE DETECTED NONE DETECTED   Cocaine POSITIVE (A) NONE DETECTED   Benzodiazepines NONE DETECTED NONE DETECTED   Amphetamines NONE DETECTED NONE DETECTED   Tetrahydrocannabinol NONE DETECTED NONE DETECTED   Barbiturates NONE DETECTED NONE DETECTED    Comment:        DRUG SCREEN FOR MEDICAL PURPOSES ONLY.  IF CONFIRMATION IS NEEDED FOR ANY PURPOSE, NOTIFY LAB WITHIN 5 DAYS.        LOWEST DETECTABLE LIMITS FOR URINE DRUG SCREEN Drug Class       Cutoff (ng/mL) Amphetamine      1000 Barbiturate      200 Benzodiazepine   923 Tricyclics       300 Opiates          300 Cocaine          300 THC  50   Comprehensive metabolic panel     Status: Abnormal   Collection Time: 09/11/16  1:26 AM  Result Value Ref Range   Sodium 136 135 - 145 mmol/L   Potassium 3.7 3.5 - 5.1 mmol/L   Chloride 105 101 - 111 mmol/L   CO2 24 22 - 32 mmol/L   Glucose, Bld 141 (H) 65 - 99 mg/dL   BUN 13 6 - 20 mg/dL   Creatinine, Ser 0.67 0.44 - 1.00 mg/dL   Calcium 9.4 8.9 - 10.3 mg/dL   Total Protein 8.2 (H) 6.5 - 8.1 g/dL   Albumin 4.4 3.5 - 5.0 g/dL   AST 22 15 - 41 U/L   ALT 21 14 - 54 U/L   Alkaline Phosphatase 57 38 - 126 U/L   Total Bilirubin 0.1 (L) 0.3 - 1.2 mg/dL   GFR calc non Af Amer >60 >60 mL/min   GFR calc Af Amer >60 >60 mL/min    Comment: (NOTE) The eGFR has been calculated using the CKD EPI equation. This calculation has not been validated in all clinical situations. eGFR's persistently <60 mL/min signify possible Chronic Kidney Disease.    Anion gap 7 5 - 15  Lipase, blood     Status: None   Collection Time: 09/11/16  1:26 AM  Result Value Ref  Range   Lipase 22 11 - 51 U/L  CBC with Differential/Platelet     Status: Abnormal   Collection Time: 09/11/16  1:26 AM  Result Value Ref Range   WBC 9.5 4.0 - 10.5 K/uL   RBC 4.65 3.87 - 5.11 MIL/uL   Hemoglobin 13.5 12.0 - 15.0 g/dL   HCT 40.5 36.0 - 46.0 %   MCV 87.1 78.0 - 100.0 fL   MCH 29.0 26.0 - 34.0 pg   MCHC 33.3 30.0 - 36.0 g/dL   RDW 12.6 11.5 - 15.5 %   Platelets 384 150 - 400 K/uL   Neutrophils Relative % 84 %   Neutro Abs 7.9 (H) 1.7 - 7.7 K/uL   Lymphocytes Relative 11 %   Lymphs Abs 1.1 0.7 - 4.0 K/uL   Monocytes Relative 5 %   Monocytes Absolute 0.5 0.1 - 1.0 K/uL   Eosinophils Relative 0 %   Eosinophils Absolute 0.0 0.0 - 0.7 K/uL   Basophils Relative 0 %   Basophils Absolute 0.0 0.0 - 0.1 K/uL  Troponin I     Status: None   Collection Time: 09/11/16  1:26 AM  Result Value Ref Range   Troponin I <0.03 <0.03 ng/mL  Wet prep, genital     Status: Abnormal   Collection Time: 09/11/16  2:09 AM  Result Value Ref Range   Yeast Wet Prep HPF POC NONE SEEN NONE SEEN   Trich, Wet Prep NONE SEEN NONE SEEN   Clue Cells Wet Prep HPF POC NONE SEEN NONE SEEN   WBC, Wet Prep HPF POC MANY (A) NONE SEEN   Sperm NONE SEEN    Ct Abdomen Pelvis W Contrast  Result Date: 09/11/2016 CLINICAL DATA:  Acute onset of generalized abdominal and back pain. Initial encounter. EXAM: CT ABDOMEN AND PELVIS WITH CONTRAST TECHNIQUE: Multidetector CT imaging of the abdomen and pelvis was performed using the standard protocol following bolus administration of intravenous contrast. CONTRAST:  126m ISOVUE-300 IOPAMIDOL (ISOVUE-300) INJECTION 61% COMPARISON:  Abdominal radiograph performed 05/03/2011 FINDINGS: Lower chest: The visualized lung bases are grossly clear. The visualized portions of the mediastinum are unremarkable. A moderate hiatal hernia is  noted. Hepatobiliary: The liver is unremarkable in appearance. There is mild pericholecystic fluid and wall thickening about the gallbladder,  concerning for mild acute cholecystitis. The common bile duct remains normal in caliber. Pancreas: The pancreas is within normal limits. Spleen: A calcified granuloma is noted in the periphery of the spleen. The spleen is otherwise unremarkable. Adrenals/Urinary Tract: The adrenal glands are unremarkable in appearance. The kidneys are within normal limits. There is no evidence of hydronephrosis. No renal or ureteral stones are identified. No perinephric stranding is seen. Stomach/Bowel: The stomach is unremarkable in appearance. The small bowel is within normal limits. The appendix is normal in caliber, without evidence of appendicitis. The colon is unremarkable in appearance. Vascular/Lymphatic: The abdominal aorta is unremarkable in appearance. The inferior vena cava is grossly unremarkable. No retroperitoneal lymphadenopathy is seen. No pelvic sidewall lymphadenopathy is identified. Reproductive: There is suggestion of multiple small uterine fibroids. The ovaries are grossly symmetric. No suspicious adnexal masses are seen. Other: A focal injection site is noted overlying the right hip, with minimal soft tissue air. Musculoskeletal: No acute osseous abnormalities are identified. The visualized musculature is unremarkable in appearance. IMPRESSION: 1. Mild pericholecystic fluid and wall thickening about the gallbladder, concerning for mild acute cholecystitis. Would correlate with the patient's symptoms. 2. Moderate hiatal hernia noted. 3. Suggestion of small uterine fibroids. Electronically Signed   By: Garald Balding M.D.   On: 09/11/2016 04:25    Review of Systems  Constitutional: Positive for chills. Negative for fever.  HENT: Negative for congestion.   Eyes: Negative for blurred vision and double vision.  Respiratory: Negative for cough and shortness of breath.   Cardiovascular: Negative for chest pain.  Gastrointestinal: Positive for abdominal pain and nausea. Negative for diarrhea and vomiting.   Genitourinary: Negative for dysuria, frequency and urgency.  Musculoskeletal: Negative for myalgias.  Neurological: Negative for dizziness and headaches.    Blood pressure 110/80, pulse (!) 54, temperature 97.9 F (36.6 C), temperature source Oral, resp. rate 16, height _0  (1.626 m), weight 76.7 kg (169 lb), last menstrual period 09/10/2016, SpO2 95 %. Physical Exam  Constitutional: She is oriented to person, place, and time. She appears well-developed and well-nourished.  HENT:  Head: Normocephalic and atraumatic.  Eyes: Conjunctivae and EOM are normal. Pupils are equal, round, and reactive to light.  Neck: Normal range of motion. Neck supple.  Cardiovascular: Normal rate and regular rhythm.   Respiratory: Effort normal and breath sounds normal. No respiratory distress.  GI: Soft. There is tenderness (mild TTP in RUQ). There is no rebound and no guarding.  Musculoskeletal: Normal range of motion.  Neurological: She is alert and oriented to person, place, and time.  Skin: Skin is warm and dry.     Assessment/Plan Appears to have classic symptoms of cholecystitis but no gallstones seen on RUQ [place].  WBC normal.  LFT's normal.  Some gallbladder wall thickening on [place] and possible sludge.  Will treat as acalculous cholecystitis and plan on surgical resection in the AM.  Admit to Med-surg floor with IVF's and abx.  Pain and nausea control.  Rosario Adie., MD [id number], 10:17 AM

## 2016-09-11 NOTE — ED Notes (Signed)
It has been reinforced to Tanya Kirk that she is to have nothing to eat or drink until it has been approved by the surgeon at [facility] ED.

## 2016-09-11 NOTE — ED Notes (Signed)
Ultrasound at bedside

## 2016-09-11 NOTE — ED Provider Notes (Signed)
[facility] Provider Note   CSN: [id number] Arrival date & time: 09/10/16  2345     History   Chief Complaint Chief Complaint  Patient presents with  . Abdominal Pain    HPI Tanya Kirk is a 40 y.o. female.  HPI Pt comes in with cc of abd pain and back pain. Pt reports that her abd pain started 1-2 days ago. Pain is located on the R Side and in her midback. Pain is now constant. Pt also reports being diagnosed with STD in the past. She has no dysuria/polyuria/hematuria. Does admit to vaginal d/c without any bleeding. Pt admits to cocaine use. Denies numbness, tingling, weakness, urinary incontinence, urinary retention.  Past Medical History:  Diagnosis Date  . Bulimia   . Depression     There are no active problems to display for this patient.   History reviewed. No pertinent surgical history.  OB History    Gravida Para Term Preterm AB Living   1       1 0   SAB TAB Ectopic Multiple Live Births     1             Home Medications    Prior to Admission medications   Medication Sig Start Date End Date Taking? Authorizing Provider  Cetirizine HCl 10 MG CAPS Take 1 capsule by mouth daily as needed. For allergies    Historical Provider, MD  ibuprofen (ADVIL,MOTRIN) 800 MG tablet Take 1 tablet (800 mg total) by mouth 3 (three) times daily. Patient not taking: Reported on 06/25/2016 11/28/15   Larene Pickett, PA-C  Ibuprofen-Diphenhydramine HCl (ADVIL PM) 200-25 MG CAPS Take 1 tablet by mouth daily as needed. For sleep    Historical Provider, MD  nitrofurantoin, macrocrystal-monohydrate, (MACROBID) 100 MG capsule Take 1 capsule (100 mg total) by mouth 2 (two) times daily. 06/25/16   Kalman Drape, PA    Family History Family History  Problem Relation Age of Onset  . Cancer Other   . Diabetes Other     Social History Social History  Substance Use Topics  . Smoking status: Current Some Day Smoker    Packs/day: 0.10    Types: Cigarettes  .  Smokeless tobacco: Never Used  . Alcohol use Yes     Comment: occ     Allergies   Tetracyclines & related and Other   Review of Systems Review of Systems  ROS 10 Systems reviewed and are negative for acute change except as noted in the HPI.     Physical Exam Updated Vital Signs BP 113/81 (BP Location: Left Arm)   Pulse 62   Temp 97.6 F (36.4 C) (Oral)   Resp 16   Ht 5\' 4"  (1.626 m)   Wt 169 lb (76.7 kg)   LMP 09/10/2016   SpO2 99%   BMI 29.01 kg/m   Physical Exam  Constitutional: She is oriented to person, place, and time. She appears well-developed and well-nourished.  HENT:  Head: Normocephalic and atraumatic.  Eyes: Conjunctivae and EOM are normal. Pupils are equal, round, and reactive to light.  Neck: Normal range of motion. Neck supple.  Cardiovascular: Normal rate, regular rhythm, normal heart sounds and intact distal pulses.   No murmur heard. Pulmonary/Chest: Effort normal. No respiratory distress. She has no wheezes.  Abdominal: Soft. Bowel sounds are normal. She exhibits no distension. There is tenderness. There is guarding. There is no rebound.  Epigastric and right sided abd tenderness. + guarding in  the epigastric and RUQ region  Genitourinary: Vagina normal and uterus normal.  Genitourinary Comments: External exam - normal, no lesions Speculum exam: Pt has some white discharge, no blood Bimanual exam: Patient has no CMT, no adnexal tenderness or fullness and cervical os is closed  Musculoskeletal:  Pt has tenderness over the lower thoracic and upper lumbar region No step offs, no erythema. Pt has 2+ patellar reflex bilaterally. Able to discriminate between sharp and dull. Able to ambulate   Neurological: She is alert and oriented to person, place, and time.  Skin: Skin is warm and dry.  Nursing note and vitals reviewed.    ED Treatments / Results  Labs (all labs ordered are listed, but only abnormal results are displayed) Labs Reviewed    WET PREP, GENITAL - Abnormal; Notable for the following:       Result Value   WBC, Wet Prep HPF POC MANY (*)    All other components within normal limits  URINALYSIS, ROUTINE W REFLEX MICROSCOPIC (NOT AT [facility]) - Abnormal; Notable for the following:    Hgb urine dipstick LARGE (*)    All other components within normal limits  URINE MICROSCOPIC-ADD ON - Abnormal; Notable for the following:    Squamous Epithelial / LPF 0-5 (*)    Bacteria, UA FEW (*)    All other components within normal limits  COMPREHENSIVE METABOLIC PANEL - Abnormal; Notable for the following:    Glucose, Bld 141 (*)    Total Protein 8.2 (*)    Total Bilirubin 0.1 (*)    All other components within normal limits  CBC WITH DIFFERENTIAL/PLATELET - Abnormal; Notable for the following:    Neutro Abs 7.9 (*)    All other components within normal limits  URINE RAPID DRUG SCREEN, HOSP PERFORMED - Abnormal; Notable for the following:    Cocaine POSITIVE (*)    All other components within normal limits  PREGNANCY, URINE  LIPASE, BLOOD  TROPONIN I  HIV ANTIBODY (ROUTINE TESTING)  GC/CHLAMYDIA PROBE AMP ([place]) NOT AT [facility]    EKG  EKG Interpretation None       Radiology Ct Abdomen Pelvis W Contrast  Result Date: 09/11/2016 CLINICAL DATA:  Acute onset of generalized abdominal and back pain. Initial encounter. EXAM: CT ABDOMEN AND PELVIS WITH CONTRAST TECHNIQUE: Multidetector CT imaging of the abdomen and pelvis was performed using the standard protocol following bolus administration of intravenous contrast. CONTRAST:  170mL ISOVUE-300 IOPAMIDOL (ISOVUE-300) INJECTION 61% COMPARISON:  Abdominal radiograph performed 05/03/2011 FINDINGS: Lower chest: The visualized lung bases are grossly clear. The visualized portions of the mediastinum are unremarkable. A moderate hiatal hernia is noted. Hepatobiliary: The liver is unremarkable in appearance. There is mild pericholecystic fluid and wall thickening about the  gallbladder, concerning for mild acute cholecystitis. The common bile duct remains normal in caliber. Pancreas: The pancreas is within normal limits. Spleen: A calcified granuloma is noted in the periphery of the spleen. The spleen is otherwise unremarkable. Adrenals/Urinary Tract: The adrenal glands are unremarkable in appearance. The kidneys are within normal limits. There is no evidence of hydronephrosis. No renal or ureteral stones are identified. No perinephric stranding is seen. Stomach/Bowel: The stomach is unremarkable in appearance. The small bowel is within normal limits. The appendix is normal in caliber, without evidence of appendicitis. The colon is unremarkable in appearance. Vascular/Lymphatic: The abdominal aorta is unremarkable in appearance. The inferior vena cava is grossly unremarkable. No retroperitoneal lymphadenopathy is seen. No pelvic sidewall lymphadenopathy is identified. Reproductive:  There is suggestion of multiple small uterine fibroids. The ovaries are grossly symmetric. No suspicious adnexal masses are seen. Other: A focal injection site is noted overlying the right hip, with minimal soft tissue air. Musculoskeletal: No acute osseous abnormalities are identified. The visualized musculature is unremarkable in appearance. IMPRESSION: 1. Mild pericholecystic fluid and wall thickening about the gallbladder, concerning for mild acute cholecystitis. Would correlate with the patient's symptoms. 2. Moderate hiatal hernia noted. 3. Suggestion of small uterine fibroids. Electronically Signed   By: Garald Balding M.D.   On: 09/11/2016 04:25    Procedures Procedures (including critical care time)  Medications Ordered in ED Medications  cefTRIAXone (ROCEPHIN) 2 g in dextrose 5 % 50 mL IVPB (2 g Intravenous New Bag/Given 09/11/16 0536)  fentaNYL (SUBLIMAZE) injection 50 mcg (50 mcg Intravenous Given 09/11/16 0043)  ondansetron (ZOFRAN) injection 4 mg (4 mg Intravenous Given 09/11/16 0043)    cefTRIAXone (ROCEPHIN) injection 1 g (1 g Intramuscular Given 09/11/16 0142)  azithromycin (ZITHROMAX) tablet 1,000 mg (1,000 mg Oral Given 09/11/16 0139)  ondansetron (ZOFRAN-ODT) disintegrating tablet 4 mg (4 mg Oral Given 09/11/16 0140)  morphine 4 MG/ML injection 4 mg (4 mg Intravenous Given 09/11/16 0151)  lidocaine (PF) (XYLOCAINE) 1 % injection (5 mLs  Given 09/11/16 0142)  morphine 4 MG/ML injection 4 mg (4 mg Intravenous Given 09/11/16 0305)  iopamidol (ISOVUE-300) 61 % injection 100 mL (100 mLs Intravenous Contrast Given 09/11/16 0357)  HYDROmorphone (DILAUDID) injection 1 mg (1 mg Intravenous Given 09/11/16 0535)  0.9 %  sodium chloride infusion ( Intravenous New Bag/Given 09/11/16 0538)     Initial Impression / Assessment and Plan / ED Course  I have reviewed the triage vital signs and the nursing notes.  Pertinent labs & imaging results that were available during my care of the patient were reviewed by me and considered in my medical decision making (see chart for details).  Clinical Course    Pt comes in with cc of abd pain, back pain. She uses cocaine. Neuro exam is non focal, pulses are equal in all 4 extremities. Pt also c/o possibly STD. However, pelvic exam was unremarkable. Labs were also normal appearing. We decided to get CT for persistent tenderness and guarding over the RUQ and epigastrium (no [place] at night time)- and noted pt had possible cholecystitis.  Transfer to [facility] ER per Dr. Lucia Gaskins, Gen Surgery. Antibiotics given. Final Clinical Impressions(s) / ED Diagnoses   Final diagnoses:  Acute cholecystitis    New Prescriptions New Prescriptions   No medications on file     Varney Biles, MD 09/11/16 [id number]

## 2016-09-11 NOTE — ED Provider Notes (Signed)
Patient sent from [facility] for gallbladder infection found on CAT scan. Gen. surgery page on arrival. Awaiting admission. Check in with patient at around 9 AM did not require any antiemetics or pain medication at that time. Patient has normal vital signs and appears well.   Dewayne Jurek Julio Alm, MD 09/11/16 1013

## 2016-09-11 NOTE — ED Notes (Signed)
On palpation pt c/o RUQ abd pain CT abd reflects possible gallbladder issues EDP at bedside.

## 2016-09-11 NOTE — ED Notes (Signed)
Attempted to obtain EKG. Nurse in with pt.

## 2016-09-11 NOTE — ED Notes (Signed)
[facility] , [facility], and [facility] all called for transport

## 2016-09-12 ENCOUNTER — Observation Stay (HOSPITAL_COMMUNITY): Payer: Self-pay | Admitting: Anesthesiology

## 2016-09-12 ENCOUNTER — Encounter (HOSPITAL_COMMUNITY): Admission: EM | Disposition: A | Discharge status: Home / Self Care | Payer: Self-pay | Source: Home / Self Care

## 2016-09-12 HISTORY — PX: CHOLECYSTECTOMY: SHX55

## 2016-09-12 LAB — COMPREHENSIVE METABOLIC PANEL
ALBUMIN: 3.8 g/dL (ref 3.5–5.0)
ALT: 176 U/L — AB (ref 14–54)
AST: 161 U/L — AB (ref 15–41)
Alkaline Phosphatase: 78 U/L (ref 38–126)
Anion gap: 7 (ref 5–15)
BUN: 6 mg/dL (ref 6–20)
CHLORIDE: 102 mmol/L (ref 101–111)
CO2: 27 mmol/L (ref 22–32)
CREATININE: 0.72 mg/dL (ref 0.44–1.00)
Calcium: 8.8 mg/dL — ABNORMAL LOW (ref 8.9–10.3)
GFR calc Af Amer: >60 mL/min (ref >60)
GFR calc non Af Amer: >60 mL/min (ref >60)
GLUCOSE: 136 mg/dL — AB (ref 65–99)
POTASSIUM: 3.5 mmol/L (ref 3.5–5.1)
SODIUM: 136 mmol/L (ref 135–145)
Total Bilirubin: 0.5 mg/dL (ref 0.3–1.2)
Total Protein: 7.6 g/dL (ref 6.5–8.1)

## 2016-09-12 LAB — CBC
HCT: 38.6 % (ref 36.0–46.0)
HEMOGLOBIN: 12.5 g/dL (ref 12.0–15.0)
MCH: 28.5 pg (ref 26.0–34.0)
MCHC: 32.4 g/dL (ref 30.0–36.0)
MCV: 88.1 fL (ref 78.0–100.0)
PLATELETS: 341 10*3/uL (ref 150–400)
RBC: 4.38 MIL/uL (ref 3.87–5.11)
RDW: 12.7 % (ref 11.5–15.5)
WBC: 8.5 10*3/uL (ref 4.0–10.5)

## 2016-09-12 LAB — HIV ANTIBODY (ROUTINE TESTING W REFLEX): HIV SCREEN 4TH GENERATION: NONREACTIVE

## 2016-09-12 SURGERY — LAPAROSCOPIC CHOLECYSTECTOMY WITH INTRAOPERATIVE CHOLANGIOGRAM
Anesthesia: General | Site: Abdomen

## 2016-09-12 MED ORDER — LIDOCAINE 2% (20 MG/ML) 5 ML SYRINGE
INTRAMUSCULAR | Status: AC
  Filled 2016-09-12: qty 5

## 2016-09-12 MED ORDER — BUPIVACAINE HCL (PF) 0.5 % IJ SOLN
INTRAMUSCULAR | Status: DC | PRN
  Administered 2016-09-12: 28 mL

## 2016-09-12 MED ORDER — ONDANSETRON HCL 4 MG/2ML IJ SOLN: 4.0000 mg | Freq: Four times a day (QID) | INTRAMUSCULAR | Status: DC | PRN

## 2016-09-12 MED ORDER — ONDANSETRON HCL 4 MG/2ML IJ SOLN
INTRAMUSCULAR | Status: DC | PRN
  Administered 2016-09-12: 4 mg via INTRAVENOUS

## 2016-09-12 MED ORDER — DEXAMETHASONE SODIUM PHOSPHATE 10 MG/ML IJ SOLN
INTRAMUSCULAR | Status: DC | PRN
  Administered 2016-09-12: 10 mg via INTRAVENOUS

## 2016-09-12 MED ORDER — SUGAMMADEX SODIUM 200 MG/2ML IV SOLN
INTRAVENOUS | Status: AC
  Filled 2016-09-12: qty 2

## 2016-09-12 MED ORDER — FENTANYL CITRATE (PF) 250 MCG/5ML IJ SOLN
INTRAMUSCULAR | Status: AC
  Filled 2016-09-12: qty 5

## 2016-09-12 MED ORDER — MORPHINE SULFATE (PF) 10 MG/ML IV SOLN
2.0000 mg | INTRAVENOUS | Status: DC | PRN
  Administered 2016-09-12: 2 mg via INTRAVENOUS
  Filled 2016-09-12: qty 1

## 2016-09-12 MED ORDER — DIPHENHYDRAMINE HCL 50 MG/ML IJ SOLN: 25.0000 mg | Freq: Four times a day (QID) | INTRAMUSCULAR | Status: DC | PRN

## 2016-09-12 MED ORDER — MIDAZOLAM HCL 2 MG/2ML IJ SOLN
INTRAMUSCULAR | Status: AC
  Filled 2016-09-12: qty 2

## 2016-09-12 MED ORDER — LACTATED RINGERS IV SOLN: INTRAVENOUS | Status: DC

## 2016-09-12 MED ORDER — ONDANSETRON HCL 4 MG/2ML IJ SOLN
INTRAMUSCULAR | Status: AC
  Filled 2016-09-12: qty 2

## 2016-09-12 MED ORDER — PROMETHAZINE HCL 25 MG/ML IJ SOLN: 6.2500 mg | INTRAMUSCULAR | Status: DC | PRN

## 2016-09-12 MED ORDER — SUGAMMADEX SODIUM 200 MG/2ML IV SOLN
INTRAVENOUS | Status: DC | PRN
  Administered 2016-09-12: 200 mg via INTRAVENOUS

## 2016-09-12 MED ORDER — BUPIVACAINE HCL (PF) 0.5 % IJ SOLN
INTRAMUSCULAR | Status: AC
  Filled 2016-09-12: qty 30

## 2016-09-12 MED ORDER — SUCCINYLCHOLINE CHLORIDE 200 MG/10ML IV SOSY
PREFILLED_SYRINGE | INTRAVENOUS | Status: DC | PRN
  Administered 2016-09-12: 100 mg via INTRAVENOUS

## 2016-09-12 MED ORDER — HYDROMORPHONE HCL 1 MG/ML IJ SOLN: 0.2500 mg | INTRAMUSCULAR | Status: DC | PRN

## 2016-09-12 MED ORDER — KCL IN DEXTROSE-NACL 20-5-0.45 MEQ/L-%-% IV SOLN
INTRAVENOUS | Status: DC
  Administered 2016-09-12: 12:00:00 via INTRAVENOUS
  Filled 2016-09-12 (×2): qty 1000

## 2016-09-12 MED ORDER — ONDANSETRON 4 MG PO TBDP: 4.0000 mg | ORAL_TABLET | Freq: Four times a day (QID) | ORAL | Status: DC | PRN

## 2016-09-12 MED ORDER — DIPHENHYDRAMINE HCL 25 MG PO CAPS
25.0000 mg | ORAL_CAPSULE | Freq: Four times a day (QID) | ORAL | Status: DC | PRN
  Administered 2016-09-13: 25 mg via ORAL
  Filled 2016-09-12: qty 1

## 2016-09-12 MED ORDER — LACTATED RINGERS IR SOLN
Status: DC | PRN
  Administered 2016-09-12: 1000 mL

## 2016-09-12 MED ORDER — KETOROLAC TROMETHAMINE 30 MG/ML IJ SOLN: 30.0000 mg | Freq: Once | INTRAMUSCULAR | Status: DC | PRN

## 2016-09-12 MED ORDER — MORPHINE SULFATE (PF) 2 MG/ML IV SOLN
2.0000 mg | INTRAVENOUS | Status: DC | PRN
  Administered 2016-09-12 – 2016-09-13 (×3): 2 mg via INTRAVENOUS
  Filled 2016-09-12 (×3): qty 1

## 2016-09-12 MED ORDER — DEXAMETHASONE SODIUM PHOSPHATE 10 MG/ML IJ SOLN
INTRAMUSCULAR | Status: AC
Start: 2016-09-12 — End: 2016-09-12
  Filled 2016-09-12: qty 1

## 2016-09-12 MED ORDER — ENOXAPARIN SODIUM 40 MG/0.4ML ~~LOC~~ SOLN
40.0000 mg | SUBCUTANEOUS | Status: DC
  Administered 2016-09-13 – 2016-09-14 (×2): 40 mg via SUBCUTANEOUS
  Filled 2016-09-12 (×3): qty 0.4

## 2016-09-12 MED ORDER — IOPAMIDOL (ISOVUE-300) INJECTION 61%
INTRAVENOUS | Status: AC
  Filled 2016-09-12: qty 50

## 2016-09-12 MED ORDER — PROPOFOL 10 MG/ML IV BOLUS
INTRAVENOUS | Status: AC
  Filled 2016-09-12: qty 20

## 2016-09-12 MED ORDER — SIMETHICONE 80 MG PO CHEW: 40.0000 mg | CHEWABLE_TABLET | Freq: Four times a day (QID) | ORAL | Status: DC | PRN

## 2016-09-12 MED ORDER — 0.9 % SODIUM CHLORIDE (POUR BTL) OPTIME
TOPICAL | Status: DC | PRN
  Administered 2016-09-12: 1000 mL

## 2016-09-12 MED ORDER — PROPOFOL 10 MG/ML IV BOLUS
INTRAVENOUS | Status: DC | PRN
  Administered 2016-09-12: 180 mg via INTRAVENOUS

## 2016-09-12 MED ORDER — SENNA 8.6 MG PO TABS
1.0000 | ORAL_TABLET | Freq: Two times a day (BID) | ORAL | Status: DC
  Administered 2016-09-12 – 2016-09-14 (×5): 8.6 mg via ORAL
  Filled 2016-09-12 (×5): qty 1

## 2016-09-12 MED ORDER — LACTATED RINGERS IV SOLN
INTRAVENOUS | Status: DC | PRN
  Administered 2016-09-12: 07:00:00 via INTRAVENOUS

## 2016-09-12 MED ORDER — OXYCODONE HCL 5 MG PO TABS
5.0000 mg | ORAL_TABLET | ORAL | Status: DC | PRN
  Administered 2016-09-12: 5 mg via ORAL
  Administered 2016-09-12 – 2016-09-14 (×6): 10 mg via ORAL
  Filled 2016-09-12 (×3): qty 2
  Filled 2016-09-12 (×2): qty 1
  Filled 2016-09-12 (×2): qty 2
  Filled 2016-09-12: qty 1

## 2016-09-12 MED ORDER — FENTANYL CITRATE (PF) 250 MCG/5ML IJ SOLN
INTRAMUSCULAR | Status: DC | PRN
  Administered 2016-09-12: 100 ug via INTRAVENOUS
  Administered 2016-09-12: 50 ug via INTRAVENOUS

## 2016-09-12 MED ORDER — ROCURONIUM BROMIDE 10 MG/ML (PF) SYRINGE
PREFILLED_SYRINGE | INTRAVENOUS | Status: DC | PRN
  Administered 2016-09-12: 30 mg via INTRAVENOUS
  Administered 2016-09-12: 10 mg via INTRAVENOUS

## 2016-09-12 MED ORDER — MIDAZOLAM HCL 5 MG/5ML IJ SOLN
INTRAMUSCULAR | Status: DC | PRN
  Administered 2016-09-12: 2 mg via INTRAVENOUS

## 2016-09-12 MED ORDER — LIDOCAINE 2% (20 MG/ML) 5 ML SYRINGE
INTRAMUSCULAR | Status: DC | PRN
  Administered 2016-09-12: 100 mg via INTRAVENOUS

## 2016-09-12 MED ORDER — ROCURONIUM BROMIDE 10 MG/ML (PF) SYRINGE
PREFILLED_SYRINGE | INTRAVENOUS | Status: AC
Start: 2016-09-12 — End: 2016-09-12
  Filled 2016-09-12: qty 10

## 2016-09-12 SURGICAL SUPPLY — 39 items
ADH SKN CLS APL DERMABOND .7 (GAUZE/BANDAGES/DRESSINGS) ×1
APPLIER CLIP 5 13 M/L LIGAMAX5 (MISCELLANEOUS) ×3
APR CLP MED LRG 5 ANG JAW (MISCELLANEOUS) ×1
BAG SPEC RTRVL LRG 6X4 10 (ENDOMECHANICALS) ×1
CABLE HIGH FREQUENCY MONO STRZ (ELECTRODE) ×3 IMPLANT
CHLORAPREP W/TINT 26ML (MISCELLANEOUS) ×3 IMPLANT
CLIP APPLIE 5 13 M/L LIGAMAX5 (MISCELLANEOUS) ×1 IMPLANT
COVER MAYO STAND STRL (DRAPES) ×3 IMPLANT
COVER SURGICAL LIGHT HANDLE (MISCELLANEOUS) ×1 IMPLANT
DERMABOND ADVANCED (GAUZE/BANDAGES/DRESSINGS) ×2
DERMABOND ADVANCED .7 DNX12 (GAUZE/BANDAGES/DRESSINGS) ×1 IMPLANT
DRAIN CHANNEL RND F F (WOUND CARE) ×2 IMPLANT
DRAPE C-ARM 42X120 X-RAY (DRAPES) ×1 IMPLANT
ELECT REM PT RETURN 9FT ADLT (ELECTROSURGICAL) ×3
ELECTRODE REM PT RTRN 9FT ADLT (ELECTROSURGICAL) ×1 IMPLANT
EVACUATOR SILICONE 100CC (DRAIN) ×2 IMPLANT
GLOVE BIO SURGEON STRL SZ 6.5 (GLOVE) ×2 IMPLANT
GLOVE BIO SURGEONS STRL SZ 6.5 (GLOVE) ×1
GLOVE BIOGEL PI IND STRL 7.0 (GLOVE) ×1 IMPLANT
GLOVE BIOGEL PI INDICATOR 7.0 (GLOVE) ×2
GOWN STRL REUS W/TWL 2XL LVL3 (GOWN DISPOSABLE) ×3 IMPLANT
GOWN STRL REUS W/TWL XL LVL3 (GOWN DISPOSABLE) ×6 IMPLANT
IRRIG SUCT STRYKERFLOW 2 WTIP (MISCELLANEOUS) ×3
IRRIGATION SUCT STRKRFLW 2 WTP (MISCELLANEOUS) ×1 IMPLANT
IV CATH 14GX2 1/4 (CATHETERS) ×1 IMPLANT
KIT BASIN OR (CUSTOM PROCEDURE TRAY) ×3 IMPLANT
POUCH SPECIMEN RETRIEVAL 10MM (ENDOMECHANICALS) ×3 IMPLANT
SCISSORS LAP 5X35 DISP (ENDOMECHANICALS) ×3 IMPLANT
SET CHOLANGIOGRAPH MIX (MISCELLANEOUS) ×1 IMPLANT
SUT ETHILON 3 0 PS 1 (SUTURE) ×2 IMPLANT
SUT VIC AB 2-0 SH 27 (SUTURE) ×3
SUT VIC AB 2-0 SH 27X BRD (SUTURE) ×1 IMPLANT
SUT VIC AB 4-0 PS2 18 (SUTURE) ×3 IMPLANT
TOWEL OR 17X26 10 PK STRL BLUE (TOWEL DISPOSABLE) ×3 IMPLANT
TOWEL OR NON WOVEN STRL DISP B (DISPOSABLE) ×3 IMPLANT
TRAY LAPAROSCOPIC (CUSTOM PROCEDURE TRAY) ×3 IMPLANT
TROCAR BLADELESS OPT 12M 100M (ENDOMECHANICALS) IMPLANT
TROCAR XCEL BLUNT TIP 100MML (ENDOMECHANICALS) ×3 IMPLANT
TUBING INSUF HEATED (TUBING) ×3 IMPLANT

## 2016-09-12 NOTE — Anesthesia Procedure Notes (Signed)
Procedure Name: Intubation Date/Time: 09/12/2016 7:31 AM Performed by: Dione Booze Pre-anesthesia Checklist: Emergency Drugs available, Suction available, Patient being monitored and Patient identified Patient Re-evaluated:Patient Re-evaluated prior to inductionOxygen Delivery Method: Circle system utilized Preoxygenation: Pre-oxygenation with 100% oxygen Intubation Type: IV induction Grade View: Grade I Tube type: Oral Tube size: 7.5 mm Number of attempts: 1 Airway Equipment and Method: Stylet Placement Confirmation: ETT inserted through vocal cords under direct vision,  positive ETCO2 and breath sounds checked- equal and bilateral Secured at: 21 cm Tube secured with: Tape Dental Injury: Teeth and Oropharynx as per pre-operative assessment

## 2016-09-12 NOTE — Progress Notes (Signed)
Cholecystitis   Subjective: Pt feeling about the same.  Objective: Vital signs in last 24 hours: Temp:  [98.6 F (37 C)-98.8 F (37.1 C)] 98.6 F (37 C) (10/08 0451) Pulse Rate:  [54-84] 81 (10/08 0451) Resp:  [16] 16 (10/08 0451) BP: (92-181)/(60-84) 93/65 (10/08 0451) SpO2:  [95 %-100 %] 96 % (10/08 0451) Weight:  [76.2 kg (168 lb)] 76.2 kg (168 lb) (10/07 1310) Last BM Date: 09/11/16  Intake/Output from previous day: 10/07 0701 - 10/08 0700 In: 723.3 [P.O.:90; I.V.:583.3; IV Piggyback:50] Out: 100 [Urine:100] Intake/Output this shift: Total I/O In: 800 [I.V.:800] Out: 50 [Blood:50]  General appearance: alert and cooperative GI: tender in RUQ  Lab Results:  Results for orders placed or performed during the hospital encounter of 09/11/16 (from the past 24 hour(s))  Surgical PCR screen     Status: None   Collection Time: 09/11/16  2:11 PM  Result Value Ref Range   MRSA, PCR NEGATIVE NEGATIVE   Staphylococcus aureus NEGATIVE NEGATIVE  Comprehensive metabolic panel     Status: Abnormal   Collection Time: 09/12/16  5:37 AM  Result Value Ref Range   Sodium 136 135 - 145 mmol/L   Potassium 3.5 3.5 - 5.1 mmol/L   Chloride 102 101 - 111 mmol/L   CO2 27 22 - 32 mmol/L   Glucose, Bld 136 (H) 65 - 99 mg/dL   BUN 6 6 - 20 mg/dL   Creatinine, Ser 0.72 0.44 - 1.00 mg/dL   Calcium 8.8 (L) 8.9 - 10.3 mg/dL   Total Protein 7.6 6.5 - 8.1 g/dL   Albumin 3.8 3.5 - 5.0 g/dL   AST 161 (H) 15 - 41 U/L   ALT 176 (H) 14 - 54 U/L   Alkaline Phosphatase 78 38 - 126 U/L   Total Bilirubin 0.5 0.3 - 1.2 mg/dL   GFR calc non Af Amer >60 >60 mL/min   GFR calc Af Amer >60 >60 mL/min   Anion gap 7 5 - 15  CBC     Status: None   Collection Time: 09/12/16  5:37 AM  Result Value Ref Range   WBC 8.5 4.0 - 10.5 K/uL   RBC 4.38 3.87 - 5.11 MIL/uL   Hemoglobin 12.5 12.0 - 15.0 g/dL   HCT 38.6 36.0 - 46.0 %   MCV 88.1 78.0 - 100.0 fL   MCH 28.5 26.0 - 34.0 pg   MCHC 32.4 30.0 - 36.0 g/dL   RDW 12.7 11.5 - 15.5 %   Platelets 341 150 - 400 K/uL     Studies/Results Radiology     MEDS, Scheduled . cefTRIAXone (ROCEPHIN)  IV  2 g Intravenous Q24H  . enoxaparin (LOVENOX) injection  40 mg Subcutaneous Daily  . nitrofurantoin (macrocrystal-monohydrate)  100 mg Oral BID     Assessment: Cholecystitis  Plan: To OR for cholecystectomy.  The anatomy & physiology of hepatobiliary & pancreatic function was discussed.  The pathophysiology of gallbladder dysfunction was discussed.  Natural history risks without surgery was discussed.   I feel the risks of no intervention will lead to serious problems that outweigh the operative risks; therefore, I recommended cholecystectomy to remove the pathology.  I explained laparoscopic techniques with possible need for an open approach.  Probable cholangiogram to evaluate the bilary tract was explained as well.    Risks such as bleeding, infection, abscess, leak, injury to other organs, need for further treatment, heart attack, death, and other risks were discussed.  I noted a good likelihood this  will help address the problem.  Possibility that this will not correct all abdominal symptoms was explained.  Goals of post-operative recovery were discussed as well.  We will work to minimize complications.  An educational handout further explaining the pathology and treatment options was given as well.  Questions were answered.  The patient expresses understanding & wishes to proceed with surgery.   LOS: 0 days    Rosario Adie, MD [facility], [place]   09/12/2016

## 2016-09-12 NOTE — Op Note (Signed)
09/11/2016 - 09/12/2016  8:46 AM  PATIENT:  Tanya Kirk  40 y.o. female  Patient Care Team: Marliss Coots, NP as PCP - General  PRE-OPERATIVE DIAGNOSIS:  cholecystitis  POST-OPERATIVE DIAGNOSIS:  cholecystitis  PROCEDURE:  LAPAROSCOPIC CHOLECYSTECTOMY  Surgeon(s): Leighton Ruff, MD  ASSISTANT: none   ANESTHESIA:   local and general  EBL:  Total I/O In: 800 [I.V.:800] Out: 50 [Blood:50]  DRAINS: (66F) Jackson-Pratt drain(s) with closed bulb suction in the gallbladder fossa   SPECIMEN:  Source of Specimen:  gallbaldder  DISPOSITION OF SPECIMEN:  PATHOLOGY  COUNTS:  YES  PLAN OF CARE: Pt already admitted  PATIENT DISPOSITION:  PACU - hemodynamically stable.  INDICATION: 40 y.o. F with classic symptoms for gallbladder pathology.  Presented to the ED with worsening pain.  The anatomy & physiology of hepatobiliary & pancreatic function was discussed.  The pathophysiology of gallbladder dysfunction was discussed.  Natural history risks without surgery was discussed.   I feel the risks of no intervention will lead to serious problems that outweigh the operative risks; therefore, I recommended cholecystectomy to remove the pathology.  I explained laparoscopic techniques with possible need for an open approach.  Probable cholangiogram to evaluate the bilary tract was explained as well.    Risks such as bleeding, infection, abscess, leak, injury to other organs, need for further treatment, heart attack, death, and other risks were discussed.  I noted a good likelihood this will help address the problem.  Possibility that this will not correct all abdominal symptoms was explained.  Goals of post-operative recovery were discussed as well.    OR FINDINGS: acute cholecystitis  DESCRIPTION:   The patient was identified & brought into the operating room. The patient was positioned supine with arms tucked. SCDs were active during the entire case. The patient underwent general  anesthesia without any difficulty.  The abdomen was prepped and draped in a sterile fashion. A Surgical Timeout was performed and confirmed our plan.  We positioned the patient in reverse [place] & right side up.  I placed a Hassan laparoscopic port through the umbilicus using open entry technique.  Entry was clean. There were no adhesions to the anterior abdominal wall supraumbilically.  We induced carbon dioxide insufflation. Camera inspection revealed no injury.    I proceeded to continue with laparoscopic technique. I placed a 5 mm port in mid subcostal region, another 53mm port in the right flank near the anterior axillary line, and a 34mm port in the left subxiphoid region obliquely within the falciform ligament.  I turned attention to the right upper quadrant.    The gallbladder fundus was elevated cephalad. I used cautery and blunt dissection to free the peritoneal coverings between the gallbladder and the liver on the posteriolateral and anteriomedial walls.   I used careful blunt and cautery dissection with a maryland dissector to help get a good critical view of the cystic artery and cystic duct. I did further dissection to free a few centimeters of the  gallbladder off the liver bed to get a good critical view of the infundibulum and cystic duct. I mobilized the cystic artery.  I skeletonized the cystic duct. There was a band on the posterior side of the gallbladder that was cauterized and dissected free.  This began to bleed deep in the portal triad.  I was unable to isolate and clip it, so I held pressure for a couple minutes and it stopped bleeding. I could not find its source and felt  like dissecting to look for it would be too dangerous given its location.  I turned my attention back to the gallbladder dissection.  After getting a good 360 view, I decided not to perform a cholangiogram.  I placed a clip on the infundibulum.  I placed clips on the cystic duct x3.  I completed cystic duct  transection.   I placed clips on the cystic artery x3 with 2 proximally.  I ligated the cystic artery using scissors. I freed the gallbladder from its remaining attachments to the liver. I ensured hemostasis on the gallbladder fossa of the liver and elsewhere. I inspected the rest of the abdomen & detected no injury nor bleeding elsewhere.  I irrigated the RUQ with normal saline.  I placed a drain in the RUQ just in case the vessel began to bleed again post op.  I removed the gallbladder through the umbilical port site.  I closed the umbilical fascia using 0 Vicryl stitches.   I closed the skin using 4-0 vicryl stitch.  Sterile dressings were applied. The patient was extubated & arrived in the PACU in stable condition.  I had discussed postoperative care with the patient in the holding area.   I will discuss  operative findings and postoperative goals / instructions with the patient's family.  Instructions are written in the chart as well.

## 2016-09-12 NOTE — Anesthesia Preprocedure Evaluation (Signed)
Anesthesia Evaluation  Patient identified by MRN, date of birth, ID band Patient awake    Reviewed: Allergy & Precautions, NPO status , Patient's Chart, lab work & pertinent test results  Airway Mallampati: II  TM Distance: >3 FB Neck ROM: Full    Dental  (+) Dental Advisory Given   Pulmonary Current Smoker,    breath sounds clear to auscultation       Cardiovascular negative cardio ROS   Rhythm:Regular Rate:Normal     Neuro/Psych Depression    GI/Hepatic negative GI ROS, (+)     substance abuse  cocaine use and marijuana use,   Endo/Other  negative endocrine ROS  Renal/GU negative Renal ROS     Musculoskeletal   Abdominal   Peds  Hematology negative hematology ROS (+)   Anesthesia Other Findings   Reproductive/Obstetrics                             Lab Results  Component Value Date   WBC 8.5 09/12/2016   HGB 12.5 09/12/2016   HCT 38.6 09/12/2016   MCV 88.1 09/12/2016   PLT 341 09/12/2016   Lab Results  Component Value Date   CREATININE 0.72 09/12/2016   BUN 6 09/12/2016   NA 136 09/12/2016   K 3.5 09/12/2016   CL 102 09/12/2016   CO2 27 09/12/2016    Anesthesia Physical Anesthesia Plan  ASA: II  Anesthesia Plan: General   Post-op Pain Management:    Induction: Intravenous  Airway Management Planned: Oral ETT  Additional Equipment:   Intra-op Plan:   Post-operative Plan: Extubation in OR  Informed Consent: I have reviewed the patients History and Physical, chart, labs and discussed the procedure including the risks, benefits and alternatives for the proposed anesthesia with the patient or authorized representative who has indicated his/her understanding and acceptance.   Dental advisory given  Plan Discussed with:   Anesthesia Plan Comments:         Anesthesia Quick Evaluation

## 2016-09-12 NOTE — Anesthesia Postprocedure Evaluation (Signed)
Anesthesia Post Note  Patient: Tanya Kirk  Procedure(s) Performed: Procedure(s) (LRB): LAPAROSCOPIC CHOLECYSTECTOMY (N/A)  Patient location during evaluation: PACU Anesthesia Type: General Level of consciousness: awake and alert Pain management: pain level controlled Vital Signs Assessment: post-procedure vital signs reviewed and stable Respiratory status: spontaneous breathing, nonlabored ventilation, respiratory function stable and patient connected to nasal cannula oxygen Cardiovascular status: blood pressure returned to baseline and stable Postop Assessment: no signs of nausea or vomiting Anesthetic complications: no    Last Vitals:  Vitals:   09/12/16 0930 09/12/16 0945  BP: 107/76 96/79  Pulse: 83 81  Resp: 20 17  Temp: 36.9 C     Last Pain:  Vitals:   09/12/16 0930  TempSrc:   PainSc: 0-No pain                 Tiajuana Amass

## 2016-09-12 NOTE — Transfer of Care (Signed)
Immediate Anesthesia Transfer of Care Note  Patient: Tanya Kirk  Procedure(s) Performed: Procedure(s): LAPAROSCOPIC CHOLECYSTECTOMY (N/A)  Patient Location: PACU  Anesthesia Type:General  Level of Consciousness: awake, alert  and patient cooperative  Airway & Oxygen Therapy: Patient Spontanous Breathing and Patient connected to face mask oxygen  Post-op Assessment: Report given to RN and Post -op Vital signs reviewed and stable  Post vital signs: Reviewed and stable  Last Vitals:  Vitals:   09/12/16 0150 09/12/16 0451  BP: 92/60 93/65  Pulse: 84 81  Resp: 16 16  Temp: 37 C 37 C    Last Pain:  Vitals:   09/12/16 0520  TempSrc:   PainSc: 2       Patients Stated Pain Goal: 3 ([id number])  Complications: No apparent anesthesia complications

## 2016-09-13 ENCOUNTER — Encounter (HOSPITAL_COMMUNITY): Payer: Self-pay | Admitting: General Surgery

## 2016-09-13 ENCOUNTER — Encounter (INDEPENDENT_AMBULATORY_CARE_PROVIDER_SITE_OTHER): Payer: Self-pay | Admitting: Physician Assistant

## 2016-09-13 LAB — GC/CHLAMYDIA PROBE AMP (~~LOC~~) NOT AT ARMC
Chlamydia: NEGATIVE
Neisseria Gonorrhea: NEGATIVE

## 2016-09-13 LAB — HEMOGLOBIN AND HEMATOCRIT, BLOOD
HEMATOCRIT: 34.3 % — AB (ref 36.0–46.0)
HEMOGLOBIN: 11.4 g/dL — AB (ref 12.0–15.0)

## 2016-09-13 NOTE — Progress Notes (Signed)
Patient ID: Tanya Kirk, female   DOB: 1976/08/24, 40 y.o.   MRN: [id number]  [facility] Surgery Progress Note  1 Day Post-Op  Subjective: States that her pain is much better than yesterday. Only has discomfort around drain. Tanya Kirk has not eaten very much since surgery nor has she ambulated yet. No flatus or BM.  Objective: Vital signs in last 24 hours: Temp:  [97.9 F (36.6 C)-99.2 F (37.3 C)] 98.2 F (36.8 C) (10/09 0530) Pulse Rate:  [64-105] 64 (10/09 0530) Resp:  [16-22] 16 (10/09 0530) BP: (96-120)/(54-79) 106/73 (10/09 0530) SpO2:  [92 %-100 %] 97 % (10/09 0530) Last BM Date: 09/10/16  Intake/Output from previous day: 10/08 0701 - 10/09 0700 In: 1740.8 [I.V.:1740.8] Out: 1840 [Urine:1700; Drains:90; Blood:50] Intake/Output this shift: No intake/output data recorded.  PE: Gen:  Alert, NAD, pleasant Pulm:  CTAB Abd: Soft, ND, minimal tenderness around drain, +BS, incisions C/D/I, drain with minimal serosanguinous drainage  RLQ drain 90cc serosanguinous output/24 hr  Lab Results:   Recent Labs  09/11/16 0126 09/12/16 0537  WBC 9.5 8.5  HGB 13.5 12.5  HCT 40.5 38.6  PLT 384 341   BMET  Recent Labs  09/11/16 0126 09/12/16 0537  NA 136 136  K 3.7 3.5  CL 105 102  CO2 24 27  GLUCOSE 141* 136*  BUN 13 6  CREATININE 0.67 0.72  CALCIUM 9.4 8.8*   PT/INR No results for input(s): LABPROT, INR in the last 72 hours. CMP     Component Value Date/Time   NA 136 09/12/2016 0537   K 3.5 09/12/2016 0537   CL 102 09/12/2016 0537   CO2 27 09/12/2016 0537   GLUCOSE 136 (H) 09/12/2016 0537   BUN 6 09/12/2016 0537   CREATININE 0.72 09/12/2016 0537   CALCIUM 8.8 (L) 09/12/2016 0537   PROT 7.6 09/12/2016 0537   ALBUMIN 3.8 09/12/2016 0537   AST 161 (H) 09/12/2016 0537   ALT 176 (H) 09/12/2016 0537   ALKPHOS 78 09/12/2016 0537   BILITOT 0.5 09/12/2016 0537   GFRNONAA >60 09/12/2016 0537   GFRAA >60 09/12/2016 0537   Lipase     Component Value  Date/Time   LIPASE 22 09/11/2016 0126       Studies/Results: US Abdomen Limited Ruq  Result Date: 09/11/2016 CLINICAL DATA:  40 year old female with a history of right upper quadrant pain for 2 days EXAM: US ABDOMEN LIMITED - RIGHT UPPER QUADRANT COMPARISON:  CT of the same date FINDINGS: Gallbladder: Circumferential gallbladder wall thickening measuring as great is 6 mm. Hyperechoic foci with within the gallbladder lumen, measuring 1.8 cm in greatest diameter, with no significant posterior shadowing. Question internal blood flow on the duplex. Sonographic Tanya Kirk sign is negative. No pericholecystic fluid. Common bile duct: Diameter: 3 mm -4 mm Liver: No focal lesion identified. Within normal limits in parenchymal echogenicity. IMPRESSION: Sonographic survey is equivocal for acute cholecystitis, as there is circumferential gallbladder wall thickening without sonographic Murphy's sign. HIDA study may be considered as a more sensitive/specific test for ductal obstruction. Hyperechoic focus in the fundus the gallbladder measuring 1.8 cm without posterior shadowing. While biliary sludge/microlithiasis could have this appearance, differential diagnosis should include gallbladder polyp as well as at adenoma. Given the size, and the inability of imaging to rule out adenocarcinoma, surgical evaluation is warranted. Signed, Tanya Kirk. Tanya Newport, DO [facility] Radiology Electronically Signed   By: Tanya Kirk D.O.   On: 09/11/2016 10:43    Anti-infectives:  Anti-infectives    Start     Dose/Rate Route Frequency Ordered Stop   09/12/16 0600  cefTRIAXone (ROCEPHIN) 2 g in dextrose 5 % 50 mL IVPB  Status:  Discontinued     2 g 100 mL/hr over 30 Minutes Intravenous Every 24 hours 09/11/16 1301 09/12/16 1034   09/11/16 0515  cefTRIAXone (ROCEPHIN) 2 g in dextrose 5 % 50 mL IVPB     2 g 100 mL/hr over 30 Minutes Intravenous  Once 09/11/16 0501 09/11/16 0703    09/11/16 0130  cefTRIAXone (ROCEPHIN) injection 1 g     1 g Intramuscular  Once 09/11/16 0126 09/11/16 0142   09/11/16 0130  azithromycin (ZITHROMAX) tablet 1,000 mg     1,000 mg Oral  Once 09/11/16 0126 09/11/16 0139       Assessment/Plan S/p laparoscopic cholecystectomy 09/12/16 Dr. Marcello Kirk - POD 1 - pain improving - no flatus, minimal appetite, has not ambulated - RLQ drain 90cc serosanguinous output/24 hr  VTE - lovenox FEN - regular  Plan - d/c IVF. Check H/H. encourage diet, ambulation. Will discuss when to remove drain with MD.    LOS: 1 day    Tanya Kirk , [facility] Surgery 09/13/2016, 8:07 AM Pager: [phone] Consults: [phone] Mon-Fri 7:00 am-4:30 pm Sat-Sun 7:00 am-11:30 am

## 2016-09-13 NOTE — Discharge Instructions (Signed)

## 2016-09-13 NOTE — Progress Notes (Signed)
Paged Dr. Harlow Asa of patient's BP 92/56, pulse 72. Will encourage more fluid intake.

## 2016-09-13 NOTE — Progress Notes (Signed)
Patient ID: Tanya Kirk, female   DOB: [birth year], 39 y.o.   MRN: [id number]  Hg 11.4 and stable. Drain pulled and steri strips applied. Patient knows to keep steris in place until they fall off on their own. Ok to shower but do not soak in tub for about 3 weeks until incisions are healed.  Bernadette Armijo A MILLER

## 2016-09-13 NOTE — Progress Notes (Signed)
Paged Dr. Lucia Gaskins about a the patient requesting a change in her PO pain medication. She feels it isn't helping her and that she would benefit more from something else.

## 2016-09-14 MED ORDER — POLYETHYLENE GLYCOL 3350 17 G PO PACK
17.0000 g | PACK | Freq: Once | ORAL | Status: AC
  Administered 2016-09-14: 17 g via ORAL
  Filled 2016-09-14: qty 1

## 2016-09-14 MED ORDER — OXYCODONE HCL 5 MG PO TABS: 5.0000 mg | ORAL_TABLET | ORAL | 0 refills | Status: DC | PRN

## 2016-09-14 NOTE — Progress Notes (Signed)
Patient ID: Tanya Kirk, female   DOB: [birth year], 40 y.o.   MRN: [id number]  [facility] Surgery Progress Note  2 Days Post-Op  Subjective: Feeling better today. Pain controlled and ambulating well. Tolerating more diet. Feels like she is going to have BM soon.  Objective: Vital signs in last 24 hours: Temp:  [97.9 F (36.6 C)-98.9 F (37.2 C)] 98.4 F (36.9 C) (10/10 0518) Pulse Rate:  [65-76] 65 (10/10 0518) Resp:  [16-17] 16 (10/10 0518) BP: (92-97)/(56-71) 94/60 (10/10 0518) SpO2:  [98 %-99 %] 98 % (10/10 0518) Last BM Date: 09/11/16  Intake/Output from previous day: 10/09 0701 - 10/10 0700 In: 720 [P.O.:720] Out: 1350 [Urine:1350] Intake/Output this shift: No intake/output data recorded.  PE: Gen:  Alert, NAD, pleasant Pulm:  CTAB Abd: Soft, ND, minimal tenderness around previous drain site but no hematoma or fluid collection, no drainage; +BS, incisions C/D/I  Lab Results:   Recent Labs  09/12/16 0537 09/13/16 1125  WBC 8.5  --   HGB 12.5 11.4*  HCT 38.6 34.3*  PLT 341  --    BMET  Recent Labs  09/12/16 0537  NA 136  K 3.5  CL 102  CO2 27  GLUCOSE 136*  BUN 6  CREATININE 0.72  CALCIUM 8.8*   PT/INR No results for input(s): LABPROT, INR in the last 72 hours. CMP     Component Value Date/Time   NA 136 09/12/2016 0537   K 3.5 09/12/2016 0537   CL 102 09/12/2016 0537   CO2 27 09/12/2016 0537   GLUCOSE 136 (H) 09/12/2016 0537   BUN 6 09/12/2016 0537   CREATININE 0.72 09/12/2016 0537   CALCIUM 8.8 (L) 09/12/2016 0537   PROT 7.6 09/12/2016 0537   ALBUMIN 3.8 09/12/2016 0537   AST 161 (H) 09/12/2016 0537   ALT 176 (H) 09/12/2016 0537   ALKPHOS 78 09/12/2016 0537   BILITOT 0.5 09/12/2016 0537   GFRNONAA >60 09/12/2016 0537   GFRAA >60 09/12/2016 0537   Lipase     Component Value Date/Time   LIPASE 22 09/11/2016 0126       Studies/Results: No results found.  Anti-infectives: Anti-infectives    Start     Dose/Rate Route  Frequency Ordered Stop   09/12/16 0600  cefTRIAXone (ROCEPHIN) 2 g in dextrose 5 % 50 mL IVPB  Status:  Discontinued     2 g 100 mL/hr over 30 Minutes Intravenous Every 24 hours 09/11/16 1301 09/12/16 1034   09/11/16 0515  cefTRIAXone (ROCEPHIN) 2 g in dextrose 5 % 50 mL IVPB     2 g 100 mL/hr over 30 Minutes Intravenous  Once 09/11/16 0501 09/11/16 0703   09/11/16 0130  cefTRIAXone (ROCEPHIN) injection 1 g     1 g Intramuscular  Once 09/11/16 0126 09/11/16 0142   09/11/16 0130  azithromycin (ZITHROMAX) tablet 1,000 mg     1,000 mg Oral  Once 09/11/16 0126 09/11/16 0139       Assessment/Plan S/p laparoscopic cholecystectomy 09/12/16 Dr. Marcello Moores - POD 2 - pain improving and appetite improving  VTE - lovenox FEN - regular  Plan - ready for discharge today.  Patient will follow-up with Dr. Marcello Moores in about 3 weeks.   LOS: 2 days    Jerrye Beavers , [facility] Surgery 09/14/2016, 10:08 AM Pager: [phone] Consults: [phone] Mon-Fri 7:00 am-4:30 pm Sat-Sun 7:00 am-11:30 am

## 2016-09-14 NOTE — Progress Notes (Signed)
Pt's vitals are WNL, tolerating diet and pain is under control. Discussed discharge instructions with patient. All questions and concerns addressed. Discharged to home with prescription and work note.

## 2016-09-14 NOTE — Discharge Summary (Signed)
[facility] Surgery Discharge Summary   Patient ID: Tanya Kirk MRN: [id number] DOB/AGE: 41-Apr-1977 41 y.o.  Admit date: 09/11/2016 Discharge date: 09/14/2016  Admitting Diagnosis: Acute cholecystitis RUQ abdominal pain  Discharge Diagnosis Patient Active Problem List   Diagnosis Date Noted  . Cholecystitis 09/11/2016    Consultants None  Imaging: US Abdomen limited RUQ 09/11/16: Sonographic survey is equivocal for acute cholecystitis, as there is circumferential gallbladder wall thickening without sonographic Murphy's sign. HIDA study may be considered as a more sensitive/specific test for ductal obstruction. Hyperechoic focus in the fundus the gallbladder measuring 1.8 cm without posterior shadowing. While biliary sludge/microlithiasis could have this appearance, differential diagnosis should include gallbladder polyp as well as at adenoma. Given the size, and the inability of imaging to rule out adenocarcinoma, surgical evaluation is warranted.  CT Abdomen Pelvis W Contrast 09/11/16: 1. Mild pericholecystic fluid and wall thickening about the gallbladder, concerning for mild acute cholecystitis. Would correlate with the patient's symptoms. 2. Moderate hiatal hernia noted. 3. Suggestion of small uterine fibroids.  Procedures Dr. Marcello Moores (09/12/16) - Laparoscopic Cholecystectomy  Hospital Course:  Tanya Kirk is a 40yo female who presented to [facility] 09/11/16 with ~1 month of RUQ. Her pain worsened the night prior to admission after eating pizza.  Workup showed acalculous cholecystitis.  Patient was admitted and underwent procedure listed above.  During surgery patient had some bleeding deep in the portal triad therefore a drain was placed. Tolerated procedure well and was transferred to the floor.  On POD 1 her hemoglobin/hematocrit was stable and there was on 90cc output into the drain over 24 hours, therefore the drain was removed. Diet was advanced as tolerated.  On POD2 the  patient was voiding well, tolerating diet, ambulating well, pain well controlled, vital signs stable, incisions c/d/i and felt stable for discharge home.  Patient will follow up in our office in 3 weeks and knows to call with questions or concerns.    Physical Exam: Gen: Alert, NAD, pleasant Pulm: CTAB Abd: Soft, ND, minimal tenderness around previous drain site but no hematoma or fluid collection, no drainage; +BS, incisions C/D/I    Medication List    TAKE these medications   cetirizine 10 MG tablet Commonly known as:  ZYRTEC Take 10 mg by mouth daily.   ibuprofen 800 MG tablet Commonly known as:  ADVIL,MOTRIN Take 1 tablet (800 mg total) by mouth 3 (three) times daily.   MONISTAT 3 COMBINATION PACK 200 & 2 MG-% (9GM) Kit Generic drug:  miconazole nitrate Place 1 application vaginally Nightly.   nitrofurantoin (macrocrystal-monohydrate) 100 MG capsule Commonly known as:  MACROBID Take 1 capsule (100 mg total) by mouth 2 (two) times daily.   oxyCODONE 5 MG immediate release tablet Commonly known as:  Oxy IR/ROXICODONE Take 1 tablet (5 mg total) by mouth every 4 (four) hours as needed for moderate pain.        Follow-up Information    Rosario Adie., MD. Call today.   Specialty:  General Surgery Why:  Your appointment is 10/04/2016 at 12pm. Please arrive 30 minutes prior to your appointment time to fill out necessary paperwork. If you do not arrive early we may have to reschedule you. Contact information: [address] [place] [id number] [phone]           Signed: Jerrye Beavers, [facility] Surgery 09/14/2016, 3:06 PM Pager: [phone] Consults: [phone] Mon-Fri 7:00 am-4:30 pm Sat-Sun 7:00 am-11:30 am

## 2016-12-31 ENCOUNTER — Ambulatory Visit: Payer: Self-pay | Attending: Internal Medicine

## 2017-01-10 ENCOUNTER — Ambulatory Visit: Payer: Self-pay | Attending: Family Medicine | Admitting: Family Medicine

## 2017-01-10 VITALS — BP 101/65 | HR 79 | Temp 98.4°F | Resp 18 | Ht 64.5 in | Wt 179.4 lb

## 2017-01-10 DIAGNOSIS — M722 Plantar fascial fibromatosis: Secondary | ICD-10-CM | POA: Insufficient documentation

## 2017-01-10 DIAGNOSIS — Z79899 Other long term (current) drug therapy: Secondary | ICD-10-CM | POA: Insufficient documentation

## 2017-01-10 DIAGNOSIS — R202 Paresthesia of skin: Secondary | ICD-10-CM | POA: Insufficient documentation

## 2017-01-10 DIAGNOSIS — Z113 Encounter for screening for infections with a predominantly sexual mode of transmission: Secondary | ICD-10-CM | POA: Insufficient documentation

## 2017-01-10 DIAGNOSIS — N898 Other specified noninflammatory disorders of vagina: Secondary | ICD-10-CM | POA: Insufficient documentation

## 2017-01-10 DIAGNOSIS — M2011 Hallux valgus (acquired), right foot: Secondary | ICD-10-CM | POA: Insufficient documentation

## 2017-01-10 DIAGNOSIS — R102 Pelvic and perineal pain: Secondary | ICD-10-CM | POA: Insufficient documentation

## 2017-01-10 LAB — POCT URINE PREGNANCY: PREG TEST UR: NEGATIVE

## 2017-01-10 MED ORDER — IBUPROFEN 800 MG PO TABS: 800.0000 mg | ORAL_TABLET | Freq: Three times a day (TID) | ORAL | 0 refills | Status: DC | PRN

## 2017-01-10 MED ORDER — IBUPROFEN 800 MG PO TABS
800.0000 mg | ORAL_TABLET | Freq: Three times a day (TID) | ORAL | 0 refills | Status: DC | PRN
Start: 2017-01-10 — End: 2017-08-17

## 2017-01-10 MED ORDER — FLUCONAZOLE 150 MG PO TABS: 150.0000 mg | ORAL_TABLET | Freq: Once | ORAL | 0 refills | Status: DC

## 2017-01-10 MED ORDER — GABAPENTIN 300 MG PO CAPS: 300.0000 mg | ORAL_CAPSULE | Freq: Every day | ORAL | 2 refills | Status: DC

## 2017-01-10 MED ORDER — METRONIDAZOLE 500 MG PO TABS: 500.0000 mg | ORAL_TABLET | Freq: Two times a day (BID) | ORAL | 0 refills | Status: DC

## 2017-01-10 MED ORDER — FLUCONAZOLE 150 MG PO TABS: 150.0000 mg | ORAL_TABLET | Freq: Once | ORAL | 0 refills | Status: AC

## 2017-01-10 MED FILL — ?METRONIDAZOLE 500 MG TABLE: 500 | 7 days supply | Qty: 14 | Fill #0

## 2017-01-10 MED FILL — IBUPROFEN 800 MG TABLET: 800 | 13 days supply | Qty: 40 | Fill #0

## 2017-01-10 MED FILL — FLUCONAZOLE 150 MG TABLET: 150 | 1 days supply | Qty: 1 | Fill #0

## 2017-01-10 NOTE — Progress Notes (Signed)
Patient is here to Establish care.  Patient has not taken medication today. Patient has eaten today.  Patient declined the flu vaccine today.  Patient denies pain at this time.  Patient would like the tetanus vaccine today.  Patient denies any suicidal ideations at this time.

## 2017-01-10 NOTE — Progress Notes (Signed)
Subjective:  Patient ID: Tanya Kirk, female    DOB: September 22, 1976  Age: 41 y.o. MRN: [id number]  CC: Establish Care   HPI Tanya Kirk presents for c/o 2 year history of intermittent of foot pain. Reports pain is worsened by standing. She also c/o sharp pain to right lateral great toe. She denies any history of injury.  Reports taking Advil and soaking feet with minimal relief. She also c/o vaginal discharge for 1 month. Discharge is minimal, white, thin, and malodorous. She reports condom use but reports condom broke during last sexual encounter. Denies any vaginal lesions.    Outpatient Medications Prior to Visit  Medication Sig Dispense Refill  . cetirizine (ZYRTEC) 10 MG tablet Take 10 mg by mouth daily.    Marland Kitchen ibuprofen (ADVIL,MOTRIN) 800 MG tablet Take 1 tablet (800 mg total) by mouth 3 (three) times daily. 21 tablet 0  . miconazole nitrate (MONISTAT 3 COMBINATION PACK) 200 & 2 MG-% (9GM) KIT Place 1 application vaginally Nightly.    . nitrofurantoin, macrocrystal-monohydrate, (MACROBID) 100 MG capsule Take 1 capsule (100 mg total) by mouth 2 (two) times daily. 10 capsule 0  . oxyCODONE (OXY IR/ROXICODONE) 5 MG immediate release tablet Take 1 tablet (5 mg total) by mouth every 4 (four) hours as needed for moderate pain. 30 tablet 0   No facility-administered medications prior to visit.     ROS Review of Systems  Respiratory: Negative.   Cardiovascular: Negative.   Gastrointestinal: Negative.   Genitourinary: Positive for vaginal discharge.  Musculoskeletal: Positive for arthralgias and myalgias.   Objective:  BP 101/65 (BP Location: Right Arm, Patient Position: Sitting, Cuff Size: Large)   Pulse 79   Temp 98.4 F (36.9 C) (Oral)   Resp 18   Ht 5' 4.5" (1.638 m)   Wt 179 lb 6.4 oz (81.4 kg)   LMP 01/06/2017   SpO2 100%   BMI 30.32 kg/m   BP/Weight 01/10/2017 09/14/2016 50/01/7740  Systolic BP 287 94 -  Diastolic BP 65 60 -  Wt. (Lbs) 179.4 - 168  BMI 30.32 - 28.39    Physical Exam  Cardiovascular: Normal rate, regular rhythm, normal heart sounds and intact distal pulses.   Pulmonary/Chest: Effort normal and breath sounds normal.  Abdominal: Soft. Bowel sounds are normal.  Musculoskeletal: Normal range of motion.       Feet:  Nursing note and vitals reviewed.    Assessment & Plan:   Problem List Items Addressed This Visit    None    Visit Diagnoses    Plantar fasciitis    -  Primary   Relevant Medications   ibuprofen (ADVIL,MOTRIN) 800 MG tablet   Other Relevant Orders   Ambulatory referral to Podiatry   Vaginal discharge       Relevant Medications   metroNIDAZOLE (FLAGYL) 500 MG tablet   Other Relevant Orders   Urine cytology ancillary only (Completed)   POCT urine pregnancy (Completed)   Screening for STD (sexually transmitted disease)       Relevant Orders   Urine cytology ancillary only (Completed)   STD Panel (HBSAG,HIV,RPR) (Completed)   Pins and needles sensation       Relevant Medications   gabapentin (NEURONTIN) 300 MG capsule   Other Relevant Orders   Ambulatory referral to Podiatry   Hallux valgus, right       Pelvic pain       Relevant Orders   POCT urine pregnancy (Completed)      Meds ordered  this encounter  Medications  . DISCONTD: ibuprofen (ADVIL,MOTRIN) 800 MG tablet    Sig: Take 1 tablet (800 mg total) by mouth every 8 (eight) hours as needed for mild pain or moderate pain.    Dispense:  40 tablet    Refill:  0    Order Specific Question:   Supervising Provider    Answer:   Tresa Garter [id number]  . DISCONTD: metroNIDAZOLE (FLAGYL) 500 MG tablet    Sig: Take 1 tablet (500 mg total) by mouth 2 (two) times daily.    Dispense:  14 tablet    Refill:  0    Order Specific Question:   Supervising Provider    Answer:   Tresa Garter [id number]  . DISCONTD: fluconazole (DIFLUCAN) 150 MG tablet    Sig: Take 1 tablet (150 mg total) by mouth once.    Dispense:  1 tablet    Refill:  0    Order  Specific Question:   Supervising Provider    Answer:   Tresa Garter [id number]  . gabapentin (NEURONTIN) 300 MG capsule    Sig: Take 1 capsule (300 mg total) by mouth at bedtime.    Dispense:  30 capsule    Refill:  2    Order Specific Question:   Supervising Provider    Answer:   Tresa Garter [id number]  . fluconazole (DIFLUCAN) 150 MG tablet    Sig: Take 1 tablet (150 mg total) by mouth once.    Dispense:  1 tablet    Refill:  0    Order Specific Question:   Supervising Provider    Answer:   Tresa Garter [id number]  . metroNIDAZOLE (FLAGYL) 500 MG tablet    Sig: Take 1 tablet (500 mg total) by mouth 2 (two) times daily.    Dispense:  14 tablet    Refill:  0    Order Specific Question:   Supervising Provider    Answer:   Tresa Garter [id number]  . ibuprofen (ADVIL,MOTRIN) 800 MG tablet    Sig: Take 1 tablet (800 mg total) by mouth every 8 (eight) hours as needed for mild pain or moderate pain.    Dispense:  40 tablet    Refill:  0    Order Specific Question:   Supervising Provider    Answer:   Tresa Garter [[id number]]       Alfonse Spruce FNP

## 2017-01-10 NOTE — Patient Instructions (Signed)
Plantar Fasciitis Plantar fasciitis is a painful foot condition that affects the heel. It occurs when the band of tissue that connects the toes to the heel bone (plantar fascia) becomes irritated. This can happen after exercising too much or doing other repetitive activities (overuse injury). The pain from plantar fasciitis can range from mild irritation to severe pain that makes it difficult for you to walk or move. The pain is usually worse in the morning or after you have been sitting or lying down for a while. CAUSES This condition may be caused by:  Standing for long periods of time.  Wearing shoes that do not fit.  Doing high-impact activities, including running, aerobics, and ballet.  Being overweight.  Having an abnormal way of walking (gait).  Having tight calf muscles.  Having high arches in your feet.  Starting a new athletic activity. SYMPTOMS The main symptom of this condition is heel pain. Other symptoms include:  Pain that gets worse after activity or exercise.  Pain that is worse in the morning or after resting.  Pain that goes away after you walk for a few minutes. DIAGNOSIS This condition may be diagnosed based on your signs and symptoms. Your health care provider will also do a physical exam to check for:  A tender area on the bottom of your foot.  A high arch in your foot.  Pain when you move your foot.  Difficulty moving your foot. You may also need to have imaging studies to confirm the diagnosis. These can include:  X-rays.  Ultrasound.  MRI. TREATMENT  Treatment for plantar fasciitis depends on the severity of the condition. Your treatment may include:  Rest, ice, and over-the-counter pain medicines to manage your pain.  Exercises to stretch your calves and your plantar fascia.  A splint that holds your foot in a stretched, upward position while you sleep (night splint).  Physical therapy to relieve symptoms and prevent problems in the  future.  Cortisone injections to relieve severe pain.  Extracorporeal shock wave therapy (ESWT) to stimulate damaged plantar fascia with electrical impulses. It is often used as a last resort before surgery.  Surgery, if other treatments have not worked after 12 months. HOME CARE INSTRUCTIONS  Take medicines only as directed by your health care provider.  Avoid activities that cause pain.  Roll the bottom of your foot over a bag of ice or a bottle of cold water. Do this for 20 minutes, 3-4 times a day.  Perform simple stretches as directed by your health care provider.  Try wearing athletic shoes with air-sole or gel-sole cushions or soft shoe inserts.  Wear a night splint while sleeping, if directed by your health care provider.  Keep all follow-up appointments with your health care provider. PREVENTION   Do not perform exercises or activities that cause heel pain.  Consider finding low-impact activities if you continue to have problems.  Lose weight if you need to. The best way to prevent plantar fasciitis is to avoid the activities that aggravate your plantar fascia. SEEK MEDICAL CARE IF:  Your symptoms do not go away after treatment with home care measures.  Your pain gets worse.  Your pain affects your ability to move or do your daily activities. This information is not intended to replace advice given to you by your health care provider. Make sure you discuss any questions you have with your health care provider. Document Released: 08/17/2001 Document Revised: 03/15/2016 Document Reviewed: 10/02/2014 Elsevier Interactive Patient Education    2017 [employer]vier Inc.  

## 2017-01-11 LAB — STD PANEL
HEP B S AG: NEGATIVE
HIV: NONREACTIVE

## 2017-01-12 LAB — URINE CYTOLOGY ANCILLARY ONLY
Chlamydia: NEGATIVE
NEISSERIA GONORRHEA: NEGATIVE
Trichomonas: NEGATIVE

## 2017-01-13 ENCOUNTER — Telehealth: Payer: Self-pay

## 2017-01-13 LAB — URINE CYTOLOGY ANCILLARY ONLY
BACTERIAL VAGINITIS: NEGATIVE
Candida vaginitis: NEGATIVE

## 2017-01-13 NOTE — Telephone Encounter (Signed)
CMA call to go over lab results  Patient Verify DOB  Patient was aware and understood   

## 2017-01-13 NOTE — Telephone Encounter (Signed)
-----   Message from Alfonse Spruce, [place] sent at 01/13/2017  8:26 AM EST ----- HIV negative Hep B negative Syphilis negative Gonorrhea, Chlamydia, and Trichomonas were all negative.

## 2017-01-20 ENCOUNTER — Other Ambulatory Visit: Payer: Self-pay | Admitting: Obstetrics and Gynecology

## 2017-01-20 DIAGNOSIS — Z1231 Encounter for screening mammogram for malignant neoplasm of breast: Secondary | ICD-10-CM

## 2017-02-03 ENCOUNTER — Ambulatory Visit (HOSPITAL_COMMUNITY): Payer: Self-pay

## 2017-02-05 ENCOUNTER — Emergency Department (HOSPITAL_COMMUNITY)
Admission: EM | Admit: 2017-02-05 | Discharge: 2017-02-05 | Disposition: A | Discharge status: Home / Self Care | Payer: Self-pay | Attending: Emergency Medicine | Admitting: Emergency Medicine

## 2017-02-05 ENCOUNTER — Encounter (HOSPITAL_COMMUNITY): Payer: Self-pay | Admitting: Emergency Medicine

## 2017-02-05 DIAGNOSIS — F17210 Nicotine dependence, cigarettes, uncomplicated: Secondary | ICD-10-CM | POA: Insufficient documentation

## 2017-02-05 DIAGNOSIS — R112 Nausea with vomiting, unspecified: Secondary | ICD-10-CM | POA: Insufficient documentation

## 2017-02-05 DIAGNOSIS — R109 Unspecified abdominal pain: Secondary | ICD-10-CM

## 2017-02-05 DIAGNOSIS — R197 Diarrhea, unspecified: Secondary | ICD-10-CM | POA: Insufficient documentation

## 2017-02-05 DIAGNOSIS — A64 Unspecified sexually transmitted disease: Secondary | ICD-10-CM | POA: Insufficient documentation

## 2017-02-05 LAB — URINALYSIS, ROUTINE W REFLEX MICROSCOPIC
Bilirubin Urine: NEGATIVE
GLUCOSE, UA: NEGATIVE mg/dL
KETONES UR: NEGATIVE mg/dL
LEUKOCYTES UA: NEGATIVE
NITRITE: NEGATIVE
PROTEIN: 30 mg/dL — AB
Specific Gravity, Urine: 1.032 — ABNORMAL HIGH (ref 1.005–1.030)
pH: 5 (ref 5.0–8.0)

## 2017-02-05 LAB — I-STAT BETA HCG BLOOD, ED (MC, WL, AP ONLY): I-stat hCG, quantitative: <5 m[IU]/mL (ref <5)

## 2017-02-05 LAB — WET PREP, GENITAL
Clue Cells Wet Prep HPF POC: NONE SEEN
SPERM: NONE SEEN
TRICH WET PREP: NONE SEEN
Yeast Wet Prep HPF POC: NONE SEEN

## 2017-02-05 LAB — CBC
HEMATOCRIT: 40.7 % (ref 36.0–46.0)
HEMOGLOBIN: 13.7 g/dL (ref 12.0–15.0)
MCH: 29 pg (ref 26.0–34.0)
MCHC: 33.7 g/dL (ref 30.0–36.0)
MCV: 86.2 fL (ref 78.0–100.0)
Platelets: 379 10*3/uL (ref 150–400)
RBC: 4.72 MIL/uL (ref 3.87–5.11)
RDW: 12.5 % (ref 11.5–15.5)
WBC: 7.9 10*3/uL (ref 4.0–10.5)

## 2017-02-05 LAB — COMPREHENSIVE METABOLIC PANEL
ALK PHOS: 59 U/L (ref 38–126)
ALT: 26 U/L (ref 14–54)
ANION GAP: 5 (ref 5–15)
AST: 25 U/L (ref 15–41)
Albumin: 4 g/dL (ref 3.5–5.0)
BILIRUBIN TOTAL: 0.5 mg/dL (ref 0.3–1.2)
BUN: 8 mg/dL (ref 6–20)
CALCIUM: 9.2 mg/dL (ref 8.9–10.3)
CO2: 24 mmol/L (ref 22–32)
Chloride: 109 mmol/L (ref 101–111)
Creatinine, Ser: 0.74 mg/dL (ref 0.44–1.00)
GFR calc Af Amer: >60 mL/min (ref >60)
GFR calc non Af Amer: >60 mL/min (ref >60)
Glucose, Bld: 125 mg/dL — ABNORMAL HIGH (ref 65–99)
POTASSIUM: 3.6 mmol/L (ref 3.5–5.1)
SODIUM: 138 mmol/L (ref 135–145)
TOTAL PROTEIN: 7.8 g/dL (ref 6.5–8.1)

## 2017-02-05 LAB — LIPASE, BLOOD: LIPASE: 17 U/L (ref 11–51)

## 2017-02-05 MED ORDER — LIDOCAINE HCL (PF) 1 % IJ SOLN
INTRAMUSCULAR | Status: AC
  Administered 2017-02-05: 5 mL
  Filled 2017-02-05: qty 5

## 2017-02-05 MED ORDER — CEFTRIAXONE SODIUM 250 MG IJ SOLR
250.0000 mg | Freq: Once | INTRAMUSCULAR | Status: AC
  Administered 2017-02-05: 250 mg via INTRAMUSCULAR
  Filled 2017-02-05: qty 250

## 2017-02-05 MED ORDER — AZITHROMYCIN 1 G PO PACK
1.0000 g | PACK | Freq: Once | ORAL | Status: AC
  Administered 2017-02-05: 1 g via ORAL
  Filled 2017-02-05: qty 1

## 2017-02-05 MED ORDER — ACETAMINOPHEN 500 MG PO TABS
1000.0000 mg | ORAL_TABLET | Freq: Once | ORAL | Status: AC
  Administered 2017-02-05: 1000 mg via ORAL
  Filled 2017-02-05: qty 2

## 2017-02-05 NOTE — ED Notes (Addendum)
Pt presents to ED with generalized abd pain (states she had gallbladder removed months ago and it is still tender but this pain is new) accompanied by N/V and "looser than normal stools" x 4 days. Pt also reports having new sexual partner and believes the condom broke and is requesting STD check.

## 2017-02-05 NOTE — Discharge Instructions (Signed)
It is possible that you have a sexually transmitted disease, you have been treated for both gonorrhea and chlamydia.  Have your sexual partner checked and treated.  For pain, use Tylenol.  If your stomach is feeling better you can start a bland diet.  Otherwise, stay on clear liquids until you are able to tolerate food,

## 2017-02-05 NOTE — ED Notes (Signed)
Patient aware of need for urine sample, ambulatory to restroom down hall with steady gait.

## 2017-02-05 NOTE — ED Notes (Signed)
Patient verbalized understanding of discharge instructions and denies any further needs or questions at this time. VS stable. Patient ambulatory with steady gait.  

## 2017-02-05 NOTE — ED Triage Notes (Signed)
Pt c/o abdominal pain onset yesterday and N/V x 4 days. Pt also request full STD check due to having new sexual partner and thinks "the condom came off."

## 2017-02-05 NOTE — ED Provider Notes (Signed)
[facility] DEPT Provider Note   CSN: [id number] Arrival date & time: 02/05/17  1626     History   Chief Complaint Chief Complaint  Patient presents with  . Emesis  . Diarrhea  . Abdominal Pain    HPI Tanya Kirk is a 41 y.o. female.  She presents for evaluation of abdominal pain characterized as cramping, with nausea vomiting and diarrhea for 3 days.  The stool is described as "loose".  The emesis is yellow.  She feels like her abdomen is swollen.  She is having a small amount of vaginal discharge.  She has a new sexual partner, and feels like she may be exposed to an STD because the "condom fell off".  She had laparoscopic cholecystectomy, October 2017.  She denies fever chills cough chest pain weakness or dizziness.  She has had some urinary incontinence but no dysuria urinary frequency or hematuria.  She denies back pain.  There are no other known modifying factors.  HPI  Past Medical History:  Diagnosis Date  . Bulimia   . Depression     Patient Active Problem List   Diagnosis Date Noted  . Cholecystitis 09/11/2016    Past Surgical History:  Procedure Laterality Date  . CHOLECYSTECTOMY N/A 09/12/2016   Procedure: LAPAROSCOPIC CHOLECYSTECTOMY;  Surgeon: Leighton Ruff, MD;  Location: [facility];  Service: General;  Laterality: N/A;    OB History    Gravida Para Term Preterm AB Living   1       1 0   SAB TAB Ectopic Multiple Live Births     1             Home Medications    Prior to Admission medications   Medication Sig Start Date End Date Taking? Authorizing Provider  Ibuprofen-Diphenhydramine Cit (ADVIL PM PO) Take 1 tablet by mouth every other day.    Yes Historical Provider, MD  loratadine (CLARITIN) 10 MG tablet Take 10 mg by mouth every other day.    Yes Historical Provider, MD  metroNIDAZOLE (FLAGYL) 500 MG tablet Take 1 tablet (500 mg total) by mouth 2 (two) times daily. 01/10/17  Yes Alfonse Spruce, FNP  gabapentin (NEURONTIN) 300 MG capsule Take  1 capsule (300 mg total) by mouth at bedtime. Patient not taking: Reported on 02/05/2017 01/10/17   Alfonse Spruce, FNP  ibuprofen (ADVIL,MOTRIN) 800 MG tablet Take 1 tablet (800 mg total) by mouth 3 (three) times daily. Patient not taking: Reported on 02/05/2017 11/28/15   Larene Pickett, PA-C  ibuprofen (ADVIL,MOTRIN) 800 MG tablet Take 1 tablet (800 mg total) by mouth every 8 (eight) hours as needed for mild pain or moderate pain. Patient not taking: Reported on 02/05/2017 01/10/17   Alfonse Spruce, FNP    Family History Family History  Problem Relation Age of Onset  . Cancer Other   . Diabetes Other     Social History Social History  Substance Use Topics  . Smoking status: Current Some Day Smoker    Packs/day: 0.10    Types: Cigarettes  . Smokeless tobacco: Never Used  . Alcohol use Yes     Comment: occational     Allergies   Tetracyclines & related and Other   Review of Systems Review of Systems  All other systems reviewed and are negative.    Physical Exam Updated Vital Signs BP 112/82   Pulse 74   Temp 98.3 F (36.8 C) (Oral)   Resp 18   Ht 5'  5" (1.651 m)   Wt 178 lb (80.7 kg)   LMP 01/06/2017   SpO2 99%   BMI 29.62 kg/m   Physical Exam  Constitutional: She is oriented to person, place, and time. She appears well-developed and well-nourished. No distress.  HENT:  Head: Normocephalic and atraumatic.  Eyes: Conjunctivae and EOM are normal. Pupils are equal, round, and reactive to light.  Neck: Normal range of motion and phonation normal. Neck supple.  Cardiovascular: Normal rate and regular rhythm.   Pulmonary/Chest: Effort normal and breath sounds normal. She exhibits no tenderness.  Abdominal: Soft. She exhibits distension. There is tenderness (Diffuse, mild). There is no guarding.  Genitourinary:  Genitourinary Comments: Normal external female genitalia.  Small amount of vaginal discharge.  Mild cervical motion tenderness and tenderness in both  adnexa, without palpable mass or organomegaly.  No uterine tenderness.  Musculoskeletal: Normal range of motion.  Neurological: She is alert and oriented to person, place, and time. She exhibits normal muscle tone.  Skin: Skin is warm and dry.  Psychiatric: She has a normal mood and affect. Her behavior is normal. Judgment and thought content normal.  Nursing note and vitals reviewed.    ED Treatments / Results  Labs (all labs ordered are listed, but only abnormal results are displayed) Labs Reviewed  WET PREP, GENITAL - Abnormal; Notable for the following:       Result Value   WBC, Wet Prep HPF POC FEW (*)    All other components within normal limits  COMPREHENSIVE METABOLIC PANEL - Abnormal; Notable for the following:    Glucose, Bld 125 (*)    All other components within normal limits  URINALYSIS, ROUTINE W REFLEX MICROSCOPIC - Abnormal; Notable for the following:    APPearance HAZY (*)    Specific Gravity, Urine 1.032 (*)    Hgb urine dipstick MODERATE (*)    Protein, ur 30 (*)    Bacteria, UA RARE (*)    Squamous Epithelial / LPF 6-30 (*)    All other components within normal limits  LIPASE, BLOOD  CBC  RPR  HIV ANTIBODY (ROUTINE TESTING)  I-STAT BETA HCG BLOOD, ED ([facility], [facility], [facility] ONLY)  GC/CHLAMYDIA PROBE AMP ([place]) NOT AT [facility]    EKG  EKG Interpretation None       Radiology No results found.  Procedures Procedures (including critical care time)  Medications Ordered in ED Medications  cefTRIAXone (ROCEPHIN) injection 250 mg (not administered)  azithromycin (ZITHROMAX) powder 1 g (not administered)  acetaminophen (TYLENOL) tablet 1,000 mg (1,000 mg Oral Given 02/05/17 2037)     Initial Impression / Assessment and Plan / ED Course  I have reviewed the triage vital signs and the nursing notes.  Pertinent labs & imaging results that were available during my care of the patient were reviewed by me and considered in my medical decision making (see chart  for details).     Medications  cefTRIAXone (ROCEPHIN) injection 250 mg (not administered)  azithromycin (ZITHROMAX) powder 1 g (not administered)  acetaminophen (TYLENOL) tablet 1,000 mg (1,000 mg Oral Given 02/05/17 2037)    Patient Vitals for the past 24 hrs:  BP Temp Temp src Pulse Resp SpO2 Height Weight  02/05/17 2040 112/82 - - 74 18 99 % - -  02/05/17 1945 118/78 - - 72 - 100 % - -  02/05/17 1845 107/74 - - 75 - 99 % - -  02/05/17 1830 102/75 - - 79 - 100 % - -  02/05/17  1815 107/75 - - 79 - 97 % - -  02/05/17 1745 103/67 - - - - - - -  02/05/17 1634 - - - - - - 5\' 5"  (1.651 m) 178 lb (80.7 kg)  02/05/17 1633 104/71 98.3 F (36.8 C) Oral 91 18 99 % - -    8:40 PM Reevaluation with update and discussion. After initial assessment and treatment, an updated evaluation reveals she is more comfortable now tolerating oral food and liquids, and has no further complaints.  Findings discussed with the patient.  She would like to be treated for possible STD. Damarius Karnes L     Final Clinical Impressions(s) / ED Diagnoses   Final diagnoses:  Abdominal pain, unspecified abdominal location  Nausea vomiting and diarrhea  STD (female)    Nonspecific abdominal pain with nausea vomiting diarrhea.  Sexual encounter with new partner and pelvic symptoms possibly consistent with STD.  Doubt PID, colitis, serious bacterial infection or metabolic instability.  Nursing Notes Reviewed/ Care Coordinated Applicable Imaging Reviewed Interpretation of Laboratory Data incorporated into ED treatment  The patient appears reasonably screened and/or stabilized for discharge and I doubt any other medical condition or other [facility] requiring further screening, evaluation, or treatment in the ED at this time prior to discharge.  Plan: Home Medications-APAP for pain; Home Treatments-rest, gradually advance diet; return here if the recommended treatment, does not improve the symptoms; Recommended follow up-PCP  as needed.  Sexual partner check and treat for STD   New Prescriptions New Prescriptions   No medications on file     Daleen Bo, MD 02/05/17 2108

## 2017-02-05 NOTE — ED Notes (Addendum)
MD states okay for pt to eat/drink. Provided pt with graham crackers/peanut butter and ice water.

## 2017-02-05 NOTE — ED Notes (Signed)
Pelvic cart at bedside. 

## 2017-02-07 LAB — HIV ANTIBODY (ROUTINE TESTING W REFLEX): HIV SCREEN 4TH GENERATION: NONREACTIVE

## 2017-02-07 LAB — GC/CHLAMYDIA PROBE AMP (~~LOC~~) NOT AT ARMC
CHLAMYDIA, DNA PROBE: NEGATIVE
NEISSERIA GONORRHEA: NEGATIVE

## 2017-02-07 LAB — RPR, QUANT+TP ABS (REFLEX): T Pallidum Abs: NEGATIVE

## 2017-02-07 LAB — RPR: RPR Ser Ql: REACTIVE — AB

## 2017-02-16 ENCOUNTER — Encounter (HOSPITAL_COMMUNITY): Payer: Self-pay | Admitting: Emergency Medicine

## 2017-02-16 ENCOUNTER — Emergency Department (HOSPITAL_COMMUNITY)
Admission: EM | Admit: 2017-02-16 | Discharge: 2017-02-16 | Disposition: A | Discharge status: Home / Self Care | Payer: Self-pay | Attending: Emergency Medicine | Admitting: Emergency Medicine

## 2017-02-16 DIAGNOSIS — A539 Syphilis, unspecified: Secondary | ICD-10-CM | POA: Insufficient documentation

## 2017-02-16 DIAGNOSIS — F17210 Nicotine dependence, cigarettes, uncomplicated: Secondary | ICD-10-CM | POA: Insufficient documentation

## 2017-02-16 MED ORDER — PENICILLIN G BENZATHINE 1200000 UNIT/2ML IM SUSP
2.4000 10*6.[IU] | Freq: Once | INTRAMUSCULAR | Status: AC
  Administered 2017-02-16: 2.4 10*6.[IU] via INTRAMUSCULAR
  Filled 2017-02-16 (×2): qty 4

## 2017-02-16 NOTE — ED Provider Notes (Signed)
[facility] DEPT Provider Note   CSN: [id number] Arrival date & time: 02/16/17  1243  By signing my name below, I, Ethelle Lyon Long, attest that this documentation has been prepared under the direction and in the presence of Kairav Russomanno P. Kehaulani Fruin, PA-C. Electronically Signed: Ethelle Lyon Long, Scribe. 02/16/2017. 1:18 PM.  History   Chief Complaint Chief Complaint  Patient presents with  . Exposure to STD   The history is provided by the patient and medical records. No language interpreter was used.   HPI Comments:  Tanya Kirk is a 41 y.o. female who presents to the Emergency Department seeking Tx after confirmed STD exposure with sexual partner with syphilis. She reports being seen in the ED with similar symptoms on 02/05/17, was sent home, and has since been feeling better but received a call a couple of days ago stating her blood work was confirmed with Syphilis and she should be treated for it. Pt is not in any pain currently and denies vaginal discharge, fever, rash, diarrhea, and any other symptoms at this time.  Past Medical History:  Diagnosis Date  . Bulimia   . Depression     Patient Active Problem List   Diagnosis Date Noted  . Cholecystitis 09/11/2016    Past Surgical History:  Procedure Laterality Date  . CHOLECYSTECTOMY N/A 09/12/2016   Procedure: LAPAROSCOPIC CHOLECYSTECTOMY;  Surgeon: Leighton Ruff, MD;  Location: [facility];  Service: General;  Laterality: N/A;    OB History    Gravida Para Term Preterm AB Living   1       1 0   SAB TAB Ectopic Multiple Live Births     1             Home Medications    Prior to Admission medications   Medication Sig Start Date End Date Taking? Authorizing Provider  gabapentin (NEURONTIN) 300 MG capsule Take 1 capsule (300 mg total) by mouth at bedtime. Patient not taking: Reported on 02/05/2017 01/10/17   Alfonse Spruce, FNP  ibuprofen (ADVIL,MOTRIN) 800 MG tablet Take 1 tablet (800 mg total) by mouth 3 (three) times  daily. Patient not taking: Reported on 02/05/2017 11/28/15   Larene Pickett, PA-C  ibuprofen (ADVIL,MOTRIN) 800 MG tablet Take 1 tablet (800 mg total) by mouth every 8 (eight) hours as needed for mild pain or moderate pain. Patient not taking: Reported on 02/05/2017 01/10/17   Alfonse Spruce, FNP  Ibuprofen-Diphenhydramine Cit (ADVIL PM PO) Take 1 tablet by mouth every other day.     Historical Provider, MD  loratadine (CLARITIN) 10 MG tablet Take 10 mg by mouth every other day.     Historical Provider, MD  metroNIDAZOLE (FLAGYL) 500 MG tablet Take 1 tablet (500 mg total) by mouth 2 (two) times daily. 01/10/17   Alfonse Spruce, FNP    Family History Family History  Problem Relation Age of Onset  . Cancer Other   . Diabetes Other     Social History Social History  Substance Use Topics  . Smoking status: Current Some Day Smoker    Packs/day: 0.10    Types: Cigarettes  . Smokeless tobacco: Never Used  . Alcohol use Yes     Comment: occational     Allergies   Tetracyclines & related and Other   Review of Systems Review of Systems  Constitutional: Negative for fever.  Gastrointestinal: Negative for diarrhea.  Genitourinary: Negative for vaginal discharge.  Skin: Negative for rash.  Physical Exam Updated Vital Signs BP 112/88 (BP Location: Left Arm)   Pulse 72   Temp 97.9 F (36.6 C) (Oral)   Resp 18   Ht 5' 4.5" (1.638 m)   Wt 80.7 kg   SpO2 100%   BMI 30.08 kg/m   Physical Exam  Constitutional: She appears well-developed and well-nourished. No distress.  HENT:  Head: Normocephalic and atraumatic.  Neck: Neck supple.  Cardiovascular: Normal rate, regular rhythm and normal heart sounds.   No murmur heard. Pulmonary/Chest: Effort normal and breath sounds normal. No respiratory distress. She has no wheezes.  Musculoskeletal: Normal range of motion.  Neurological: She is alert.  Skin: Skin is warm and dry.  Nursing note and vitals reviewed.    ED  Treatments / Results  DIAGNOSTIC STUDIES:  Oxygen Saturation is 100% on RA, normal by my interpretation.    COORDINATION OF CARE:  1:18 PM Discussed treatment plan with pt at bedside including Penicillin and pt agreed to plan.  Labs (all labs ordered are listed, but only abnormal results are displayed) Labs Reviewed - No data to display  EKG  EKG Interpretation None       Radiology No results found.  Procedures Procedures (including critical care time)  Medications Ordered in ED Medications  penicillin g benzathine (BICILLIN LA) 1200000 UNIT/2ML injection 2.4 Million Units (2.4 Million Units Intramuscular Given 02/16/17 1345)     Initial Impression / Assessment and Plan / ED Course  I have reviewed the triage vital signs and the nursing notes.  Pertinent labs & imaging results that were available during my care of the patient were reviewed by me and considered in my medical decision making (see chart for details).    Tanya Kirk is a 41 y.o. female who presents to ED after receiving a phone call for + syphilis test. Asymptomatic with no complaints at this time. Treated with PCN IM in ED and given ID follow up. All questions answered.    Final Clinical Impressions(s) / ED Diagnoses   Final diagnoses:  Syphilis    New Prescriptions New Prescriptions   No medications on file   I personally performed the services described in this documentation, which was scribed in my presence. The recorded information has been reviewed and is accurate.     [facility] Jammal Sarr, PA-C 02/16/17 [place], MD 02/16/17 1414

## 2017-02-16 NOTE — ED Notes (Signed)
No reaction to medication. Pt safe for d/c

## 2017-02-16 NOTE — ED Notes (Signed)
Pt denies drainage- states she is here to take medication for STD exposure. See provider notes

## 2017-02-16 NOTE — Discharge Instructions (Signed)
Please call the infectious disease center listed to schedule a follow up appointment.  Return to the ER for new or worsening symptoms, any additional concerns.

## 2017-02-16 NOTE — ED Triage Notes (Signed)
Pt was seen here on 3/4 and states someone called and told her a test was postive and she should come be treated. Pt denies any vaginal discharge.

## 2017-03-07 ENCOUNTER — Ambulatory Visit: Payer: Self-pay | Attending: Family Medicine | Admitting: Podiatry

## 2017-03-07 DIAGNOSIS — B353 Tinea pedis: Secondary | ICD-10-CM

## 2017-03-07 DIAGNOSIS — M722 Plantar fascial fibromatosis: Secondary | ICD-10-CM

## 2017-03-07 MED ORDER — KETOCONAZOLE 2 % EX CREA: 1.0000 "application " | TOPICAL_CREAM | Freq: Every day | CUTANEOUS | 2 refills | Status: DC

## 2017-03-07 MED ORDER — IBUPROFEN 800 MG PO TABS: 800.0000 mg | ORAL_TABLET | Freq: Three times a day (TID) | ORAL | 0 refills | Status: DC

## 2017-03-07 NOTE — Patient Instructions (Signed)

## 2017-03-10 NOTE — Progress Notes (Signed)
Subjective:     Patient ID: Tanya Kirk, female   DOB: December 23, 1975, 41 y.o.   MRN: [id number]  HPI  **SEEN AT [facility]** 41 year old female presents the office today for concerns of bilateral heel pain his been ongoing for the last several months. She previously saw another physician for this issue with stretching which did help with the pain started to come back. She is inquiring about a possible x-ray. She says it hurts on the bottom of the heel describes as a throbbing sensation after he she stands for long period of time. She also has pain in the morning. She has no pain to the area at this time. She denies any numbness tingling the pain does not wake her night.  She also states that she has had dry, peeling skin on the bottom of her right foot is his been on "3 months. She said no recent treatment for this.  Review of Systems  All other systems reviewed and are negative.      Objective:   Physical Exam General: AAO x3, NAD  Dermatological: On the plantar aspect of the right foot is dry, peeling, erythematous skin consistent with tinea pedis. This no drainage or pus or any warmth or swelling. There is no clinical signs of infection. No open lesions identified.  Vascular: Dorsalis Pedis artery and Posterior Tibial artery pedal pulses are 2/4 bilateral with immedate capillary fill time.  There is no pain with calf compression, swelling, warmth, erythema.   Neruologic: Grossly intact via light touch bilateral. Vibratory intact via tuning fork bilateral. Protective threshold with Semmes Wienstein monofilament intact to all pedal sites bilateral. Patellar and Achilles deep tendon reflexes 2+ bilateral. No Babinski or clonus noted bilateral.   Musculoskeletal: There is mild tenderness palpation on the plantar tubercle of the calcaneus at insertion of plantar fascia bilaterally. There is no pain along the course of the plantar fascial in the arch of the foot. There  is no pain with lateral compression of calcaneus. There is no overlying edema, erythema, increase in warmth. Range of motion intact. MMT fascial side. Equinus is present.  Gait: Unassisted, Nonantalgic.      Assessment:     Bilateral heel pain, plantar fasciitis, likely tinea pedis right side.    Plan:     -Treatment options discussed including all alternatives, risks, and complications -Etiology of symptoms were discussed -X-rays were ordered of bilateral feet today. -Recommended stretching, icing exercises daily. Also discussed shoe gear modifications and over-the-counter insert which she will purchase. -Prescribed Ketoconazole for tinea pedis.  Celesta Gentile, DPM

## 2017-04-01 ENCOUNTER — Ambulatory Visit (HOSPITAL_BASED_OUTPATIENT_CLINIC_OR_DEPARTMENT_OTHER)
Admission: RE | Admit: 2017-04-01 | Discharge: 2017-04-01 | Disposition: A | Discharge status: Home / Self Care | Payer: Self-pay | Source: Ambulatory Visit | Attending: Podiatry | Admitting: Podiatry

## 2017-04-01 DIAGNOSIS — M7732 Calcaneal spur, left foot: Secondary | ICD-10-CM | POA: Insufficient documentation

## 2017-04-01 DIAGNOSIS — M7731 Calcaneal spur, right foot: Secondary | ICD-10-CM | POA: Insufficient documentation

## 2017-04-01 DIAGNOSIS — M722 Plantar fascial fibromatosis: Secondary | ICD-10-CM | POA: Insufficient documentation

## 2017-04-04 ENCOUNTER — Encounter (HOSPITAL_COMMUNITY): Payer: Self-pay

## 2017-04-04 ENCOUNTER — Emergency Department (HOSPITAL_COMMUNITY)
Admission: EM | Admit: 2017-04-04 | Discharge: 2017-04-04 | Disposition: A | Discharge status: Home / Self Care | Payer: Self-pay | Attending: Emergency Medicine | Admitting: Emergency Medicine

## 2017-04-04 DIAGNOSIS — N760 Acute vaginitis: Secondary | ICD-10-CM | POA: Insufficient documentation

## 2017-04-04 DIAGNOSIS — F17210 Nicotine dependence, cigarettes, uncomplicated: Secondary | ICD-10-CM | POA: Insufficient documentation

## 2017-04-04 LAB — WET PREP, GENITAL
CLUE CELLS WET PREP: NONE SEEN
SPERM: NONE SEEN
Trich, Wet Prep: NONE SEEN
YEAST WET PREP: NONE SEEN

## 2017-04-04 NOTE — ED Provider Notes (Addendum)
[facility] DEPT Provider Note   CSN: [id number] Arrival date & time: 04/04/17  1018     History   Chief Complaint Chief Complaint  Patient presents with  . Vaginal Itching    HPI Tanya Kirk is a 41 y.o. female.  The history is provided by the patient.  Vaginal Itching  This is a recurrent problem. The current episode started 12 to 24 hours ago. The problem occurs constantly. The problem has not changed since onset.Pertinent negatives include no chest pain, no headaches and no shortness of breath. Nothing aggravates the symptoms. Nothing relieves the symptoms. She has tried nothing for the symptoms.   Similar to prior yeast infections. Sexually active with condoms; last yesterday. Prior GC 25 yrs ago; recent +RPR and treated with PCN.   Past Medical History:  Diagnosis Date  . Bulimia   . Depression     Patient Active Problem List   Diagnosis Date Noted  . Cholecystitis 09/11/2016    Past Surgical History:  Procedure Laterality Date  . CHOLECYSTECTOMY N/A 09/12/2016   Procedure: LAPAROSCOPIC CHOLECYSTECTOMY;  Surgeon: Leighton Ruff, MD;  Location: [facility];  Service: General;  Laterality: N/A;    OB History    Gravida Para Term Preterm AB Living   1       1 0   SAB TAB Ectopic Multiple Live Births     1             Home Medications    Prior to Admission medications   Medication Sig Start Date End Date Taking? Authorizing Provider  Ibuprofen-Diphenhydramine Cit (ADVIL PM PO) Take 1 tablet by mouth at bedtime as needed (pain).    Yes Historical Provider, MD  ketoconazole (NIZORAL) 2 % cream Apply 1 application topically daily. 03/07/17  Yes Trula Slade, DPM  loratadine (CLARITIN) 10 MG tablet Take 10 mg by mouth every other day.    Yes Historical Provider, MD  gabapentin (NEURONTIN) 300 MG capsule Take 1 capsule (300 mg total) by mouth at bedtime. Patient not taking: Reported on 02/05/2017 01/10/17   Alfonse Spruce, FNP  ibuprofen (ADVIL,MOTRIN) 800  MG tablet Take 1 tablet (800 mg total) by mouth every 8 (eight) hours as needed for mild pain or moderate pain. Patient not taking: Reported on 02/05/2017 01/10/17   Alfonse Spruce, FNP  ibuprofen (ADVIL,MOTRIN) 800 MG tablet Take 1 tablet (800 mg total) by mouth 3 (three) times daily. Patient not taking: Reported on 04/04/2017 03/07/17   Trula Slade, DPM  metroNIDAZOLE (FLAGYL) 500 MG tablet Take 1 tablet (500 mg total) by mouth 2 (two) times daily. Patient not taking: Reported on 04/04/2017 01/10/17   Alfonse Spruce, FNP    Family History Family History  Problem Relation Age of Onset  . Cancer Other   . Diabetes Other     Social History Social History  Substance Use Topics  . Smoking status: Current Some Day Smoker    Packs/day: 0.10    Types: Cigarettes  . Smokeless tobacco: Never Used  . Alcohol use Yes     Comment: occational     Allergies   Tetracyclines & related and Other   Review of Systems Review of Systems  Respiratory: Negative for shortness of breath.   Cardiovascular: Negative for chest pain.  Neurological: Negative for headaches.  All other systems are reviewed and are negative for acute change except as noted in the HPI    Physical Exam Updated Vital Signs BP 107/81 (  BP Location: Right Arm)   Pulse 84   Temp 98.3 F (36.8 C) (Oral)   Resp 16   Ht 5' 4.5" (1.638 m)   Wt 178 lb (80.7 kg)   SpO2 98%   BMI 30.08 kg/m   Physical Exam  Constitutional: She is oriented to person, place, and time. She appears well-developed and well-nourished. No distress.  HENT:  Head: Normocephalic and atraumatic.  Right Ear: External ear normal.  Left Ear: External ear normal.  Nose: Nose normal.  Eyes: Conjunctivae and EOM are normal. No scleral icterus.  Neck: Normal range of motion and phonation normal.  Cardiovascular: Normal rate and regular rhythm.   Pulmonary/Chest: Effort normal. No stridor. No respiratory distress.  Abdominal: She exhibits no  distension.  Genitourinary: Pelvic exam was performed with patient supine. Uterus is not tender. Cervix exhibits no motion tenderness, no discharge and no friability. Right adnexum displays no mass and no tenderness. Left adnexum displays no mass and no tenderness. There is erythema and tenderness in the vagina. No bleeding in the vagina. No foreign body in the vagina. No signs of injury around the vagina. No vaginal discharge found.  Genitourinary Comments: Chaperone present during pelvic exam.   Musculoskeletal: Normal range of motion. She exhibits no edema.  Neurological: She is alert and oriented to person, place, and time.  Skin: She is not diaphoretic.  Psychiatric: She has a normal mood and affect. Her behavior is normal.  Vitals reviewed.    ED Treatments / Results  Labs (all labs ordered are listed, but only abnormal results are displayed) Labs Reviewed  WET PREP, GENITAL - Abnormal; Notable for the following:       Result Value   WBC, Wet Prep HPF POC MODERATE (*)    All other components within normal limits  GC/CHLAMYDIA PROBE AMP ([facility]) NOT AT [facility]    EKG  EKG Interpretation None       Radiology No results found.  Procedures Procedures (including critical care time)  Medications Ordered in ED Medications - No data to display   Initial Impression / Assessment and Plan / ED Course  I have reviewed the triage vital signs and the nursing notes.  Pertinent labs & imaging results that were available during my care of the patient were reviewed by me and considered in my medical decision making (see chart for details).      During pelvic exam patient reported that she remembered that she had cleansed inside her vaginal with peroxide last night.  Pelvic exam without evidence of cervicitis or PID. Wet prep without evidence of Yeast, Trichomonas, or bacterial vaginosis. Most consistent with local irritation likely due to peroxide use. No indication for  empiric treatment at this time.  Safe for discharge with strict return precautions.   Final Clinical Impressions(s) / ED Diagnoses   Final diagnoses:  Contact vaginitis   Disposition: Discharge  Condition: Good  I have discussed the results, Dx and Tx plan with the patient who expressed understanding and agree(s) with the plan. Discharge instructions discussed at great length. The patient was given strict return precautions who verbalized understanding of the instructions. No further questions at time of discharge.    New Prescriptions   No medications on file    Follow Up: Alfonse Spruce, [place] [address] [phone]  Schedule an appointment as soon as possible for a visit  As needed        Fatima Blank, MD  04/04/17 1502  

## 2017-04-04 NOTE — ED Triage Notes (Signed)
Per Pt, Pt is coming from home with vaginal discomfort and vaginal discharge that started a couple days ago. Pt reports being sexually active with protection.

## 2017-04-04 NOTE — Discharge Instructions (Signed)
Reframe from using any chemicals such as soaps, peroxide, cleansers, tablets to wash within the vaginal cavity as this results in local irritation from the chemicals.

## 2017-04-05 LAB — GC/CHLAMYDIA PROBE AMP (~~LOC~~) NOT AT ARMC
Chlamydia: NEGATIVE
Neisseria Gonorrhea: NEGATIVE

## 2017-04-06 MED FILL — KETOCONAZOLE 2% CREAM: 2 | 30 days supply | Qty: 60 | Fill #0

## 2017-04-12 MED FILL — IBUPROFEN 800 MG TABLET: 800 | 7 days supply | Qty: 21 | Fill #0

## 2017-05-12 ENCOUNTER — Telehealth: Payer: Self-pay | Admitting: Family Medicine

## 2017-05-12 NOTE — Telephone Encounter (Signed)
Pt. Called requesting to speak with her nurse to see where she can go to have an ancestry test done. Please f/u with pt.

## 2017-06-13 ENCOUNTER — Encounter (HOSPITAL_COMMUNITY): Payer: Self-pay | Admitting: Emergency Medicine

## 2017-06-13 ENCOUNTER — Emergency Department (HOSPITAL_COMMUNITY)
Admission: EM | Admit: 2017-06-13 | Discharge: 2017-06-13 | Disposition: A | Discharge status: Home / Self Care | Payer: Self-pay | Attending: Emergency Medicine | Admitting: Emergency Medicine

## 2017-06-13 DIAGNOSIS — Z79899 Other long term (current) drug therapy: Secondary | ICD-10-CM | POA: Insufficient documentation

## 2017-06-13 DIAGNOSIS — F17210 Nicotine dependence, cigarettes, uncomplicated: Secondary | ICD-10-CM | POA: Insufficient documentation

## 2017-06-13 DIAGNOSIS — N898 Other specified noninflammatory disorders of vagina: Secondary | ICD-10-CM | POA: Insufficient documentation

## 2017-06-13 LAB — WET PREP, GENITAL
CLUE CELLS WET PREP: NONE SEEN
Sperm: NONE SEEN
TRICH WET PREP: NONE SEEN
WBC, Wet Prep HPF POC: NONE SEEN
YEAST WET PREP: NONE SEEN

## 2017-06-13 LAB — URINALYSIS, ROUTINE W REFLEX MICROSCOPIC
BILIRUBIN URINE: NEGATIVE
Glucose, UA: NEGATIVE mg/dL
KETONES UR: NEGATIVE mg/dL
LEUKOCYTES UA: NEGATIVE
Nitrite: NEGATIVE
Protein, ur: NEGATIVE mg/dL
Specific Gravity, Urine: 1.027 (ref 1.005–1.030)
pH: 5 (ref 5.0–8.0)

## 2017-06-13 LAB — PREGNANCY, URINE: Preg Test, Ur: NEGATIVE

## 2017-06-13 MED ORDER — AZITHROMYCIN 250 MG PO TABS
1000.0000 mg | ORAL_TABLET | Freq: Once | ORAL | Status: AC
  Administered 2017-06-13: 1000 mg via ORAL
  Filled 2017-06-13: qty 4

## 2017-06-13 MED ORDER — CEFTRIAXONE SODIUM 250 MG IJ SOLR
250.0000 mg | Freq: Once | INTRAMUSCULAR | Status: AC
  Administered 2017-06-13: 250 mg via INTRAMUSCULAR
  Filled 2017-06-13: qty 250

## 2017-06-13 MED ORDER — STERILE WATER FOR INJECTION IJ SOLN
INTRAMUSCULAR | Status: AC
  Administered 2017-06-13: 10 mL
  Filled 2017-06-13: qty 10

## 2017-06-13 NOTE — Discharge Instructions (Signed)
It was my pleasure taking care of you today!   Use a condom with every sexual encounter Follow up with your OBGYN in regards to today's visit if symptoms do not improve in 1 week. Please return to the ER for worsening symptoms, high fevers or persistent vomiting.  You have been tested for HIV, syphilis, chlamydia and gonorrhea. These results will be available in approximately 3 days. You will be notified if they are positive.   It is very important to practice safe sex and use condoms when sexually active. If your results are positive you need to notify all sexual partners so they can be treated as well. The website https://dean.info/ can be used to send anonymous text messages or emails to alert sexual contacts.   SEEK IMMEDIATE MEDICAL CARE IF:  You develop an oral temperature above 102 F (38.9 C), not controlled by medications or lasting more than 2 days.  You develop an increase in pain.  You develop vaginal bleeding and it is not time for your period.  New or worsening symptoms occur, any additional concerns.

## 2017-06-13 NOTE — ED Notes (Signed)
Got patient undress into a gown pelvic cart is at bedside

## 2017-06-13 NOTE — ED Provider Notes (Signed)
[facility] DEPT Provider Note   CSN: [id number] Arrival date & time: 06/13/17  6578     History   Chief Complaint Chief Complaint  Patient presents with  . Vaginal Discharge    HPI Tanya Kirk is a 41 y.o. female.  The history is provided by the patient and medical records. No language interpreter was used.  Vaginal Discharge   Associated symptoms include dyspareunia. Pertinent negatives include no dysuria and no frequency.   Tanya Kirk is a 41 y.o. female who presents to the Emergency Department complaining of persistent foul-smelling odor and vaginal discharge over the last 2-3 weeks. Patient states that shortly prior to symptom onset, she had intercourse, but unfortunately the condom broke. This was the first time she had ever had intercourse with this partner. No dysuria, abdominal pain, fevers or back pain. She states that she took a pregnancy test at home a few days ago which was negative. She took another today which was also negative. No alleviating/aggravating factors noted. No medications or treatments tried prior to arrival for symptoms.  Past Medical History:  Diagnosis Date  . Bulimia   . Depression     Patient Active Problem List   Diagnosis Date Noted  . Cholecystitis 09/11/2016    Past Surgical History:  Procedure Laterality Date  . CHOLECYSTECTOMY N/A 09/12/2016   Procedure: LAPAROSCOPIC CHOLECYSTECTOMY;  Surgeon: Leighton Ruff, MD;  Location: [facility] ORS;  Service: General;  Laterality: N/A;    OB History    Gravida Para Term Preterm AB Living   1       1 0   SAB TAB Ectopic Multiple Live Births     1             Home Medications    Prior to Admission medications   Medication Sig Start Date End Date Taking? Authorizing Provider  gabapentin (NEURONTIN) 300 MG capsule Take 1 capsule (300 mg total) by mouth at bedtime. Patient not taking: Reported on 02/05/2017 01/10/17   Alfonse Spruce, FNP  ibuprofen (ADVIL,MOTRIN) 800 MG tablet Take 1  tablet (800 mg total) by mouth every 8 (eight) hours as needed for mild pain or moderate pain. Patient not taking: Reported on 02/05/2017 01/10/17   Alfonse Spruce, FNP  ibuprofen (ADVIL,MOTRIN) 800 MG tablet Take 1 tablet (800 mg total) by mouth 3 (three) times daily. Patient not taking: Reported on 04/04/2017 03/07/17   Trula Slade, DPM  Ibuprofen-Diphenhydramine Cit (ADVIL PM PO) Take 1 tablet by mouth at bedtime as needed (pain).     [provider]  ketoconazole (NIZORAL) 2 % cream Apply 1 application topically daily. 03/07/17   Trula Slade, DPM  loratadine (CLARITIN) 10 MG tablet Take 10 mg by mouth every other day.     [provider]  metroNIDAZOLE (FLAGYL) 500 MG tablet Take 1 tablet (500 mg total) by mouth 2 (two) times daily. Patient not taking: Reported on 04/04/2017 01/10/17   Alfonse Spruce, FNP    Family History Family History  Problem Relation Age of Onset  . Cancer Other   . Diabetes Other     Social History Social History  Substance Use Topics  . Smoking status: Current Some Day Smoker    Packs/day: 0.10    Types: Cigarettes  . Smokeless tobacco: Never Used  . Alcohol use Yes     Comment: occational     Allergies   Tetracyclines & related and Other   Review of Systems Review  of Systems  Genitourinary: Positive for dyspareunia and vaginal discharge. Negative for difficulty urinating, dysuria, flank pain, frequency, pelvic pain, urgency and vaginal bleeding.  All other systems reviewed and are negative.    Physical Exam Updated Vital Signs BP 108/81 (BP Location: Left Arm)   Pulse 79   Temp 98.2 F (36.8 C) (Oral)   Resp 12   SpO2 98%   Physical Exam  Constitutional: She is oriented to person, place, and time. She appears well-developed and well-nourished. No distress.  HENT:  Head: Normocephalic and atraumatic.  Cardiovascular: Normal rate, regular rhythm and normal heart sounds.   No murmur  heard. Pulmonary/Chest: Effort normal and breath sounds normal. No respiratory distress.  Abdominal: Soft. Bowel sounds are normal. She exhibits no distension.  No abdominal or CVA tenderness.  Genitourinary:  Genitourinary Comments: Chaperone present for exam. Scant white discharge. No CMT. No adnexal masses, tenderness, or fullness.  No bleeding within vaginal vault.  Neurological: She is alert and oriented to person, place, and time.  Skin: Skin is warm and dry.  Nursing note and vitals reviewed.    ED Treatments / Results  Labs (all labs ordered are listed, but only abnormal results are displayed) Labs Reviewed  URINALYSIS, ROUTINE W REFLEX MICROSCOPIC - Abnormal; Notable for the following:       Result Value   Hgb urine dipstick MODERATE (*)    Bacteria, UA RARE (*)    Squamous Epithelial / LPF 0-5 (*)    All other components within normal limits  WET PREP, GENITAL  RPR  HIV ANTIBODY (ROUTINE TESTING)  PREGNANCY, URINE  POC URINE PREG, ED  GC/CHLAMYDIA PROBE AMP ([place]) NOT AT [facility]    EKG  EKG Interpretation None       Radiology No results found.  Procedures Procedures (including critical care time)  Medications Ordered in ED Medications  cefTRIAXone (ROCEPHIN) injection 250 mg (not administered)  azithromycin (ZITHROMAX) tablet 1,000 mg (not administered)     Initial Impression / Assessment and Plan / ED Course  I have reviewed the triage vital signs and the nursing notes.  Pertinent labs & imaging results that were available during my care of the patient were reviewed by me and considered in my medical decision making (see chart for details).    Tanya Kirk is a 41 y.o. female who presents to ED for vaginal discharge x 2-3 weeks. Symptoms began shortly after intercourse with new partner in which condom broke. On exam, Patient is afebrile, hemodynamically stable with no abdominal or CVA tenderness. GU with scant white discharge, no cervical motion  or adnexal tenderness. Wet prep unremarkable. UA does not appear infectious. G&C, HIV and RPR obtained. Patient aware that she will be notified if results are positive. Patient opted for prophylactic G&C treatment- azithromycin and Rocephin given. Follow-up with OB/GYN if symptoms persist. Reasons to return to the ER discussed and all questions answered.   Final Clinical Impressions(s) / ED Diagnoses   Final diagnoses:  Vaginal discharge    New Prescriptions New Prescriptions   No medications on file     Sachit Gilman, Ozella Almond, PA-C 06/13/17 [place], MD 06/13/17 1536

## 2017-06-13 NOTE — ED Triage Notes (Signed)
Pt reports she had unprotected sex over 2 weeks ago and now has been having vaginal discharge for 1 week with a foul odor.

## 2017-06-13 NOTE — ED Notes (Signed)
Will dc patient 20 min post injection

## 2017-06-13 NOTE — Discharge Planning (Signed)
Homeless pt; has been seen at [facility] (01/10/17) and [facility] for PCP needs.  Pt may follow-up at either location after discharge from ED today.

## 2017-06-14 LAB — RPR: RPR Ser Ql: NONREACTIVE

## 2017-06-14 LAB — HIV ANTIBODY (ROUTINE TESTING W REFLEX): HIV Screen 4th Generation wRfx: NONREACTIVE

## 2017-06-14 LAB — GC/CHLAMYDIA PROBE AMP (~~LOC~~) NOT AT ARMC
Chlamydia: NEGATIVE
Neisseria Gonorrhea: NEGATIVE

## 2017-07-06 ENCOUNTER — Telehealth: Payer: Self-pay | Admitting: Family Medicine

## 2017-07-06 NOTE — Telephone Encounter (Signed)
Patient called wanting to review her foot xray results, pt states she is having a lot of pain. Pt was seen by Dr. Jacqualyn Posey in our podiatry clinic . Please f/up

## 2017-07-11 ENCOUNTER — Telehealth: Payer: Self-pay | Admitting: Podiatry

## 2017-07-11 NOTE — Telephone Encounter (Signed)
Spoke with patient I did not seen any results stated that she might have to contact the doctor who order it for the results she ask if her pcp can find out about the results

## 2017-07-11 NOTE — Telephone Encounter (Signed)
She will need to contact the provider who ordered her imaging to discuss results.

## 2017-07-11 NOTE — Telephone Encounter (Signed)
Pt is requesting results of x-rays. Stated she has been needing these and asking for them since about 2-3 weeks after they were taken in April. States her doctor's office is needing to get down to the bottom of what is going on.

## 2017-07-11 NOTE — Telephone Encounter (Signed)
CMA call regarding her imaging results  Patient  Was aware and understood that's he needs to talk with the pcp who order the imaging

## 2017-07-11 NOTE — Telephone Encounter (Signed)
I spoke with pt and told her that if her physician was in [facility] network the x-rays could be viewed. Pt then asked the results and I read the Impression from the right and left as tiny spurs seen. Pt asked if she should apply for disability and I told her I didn't think so.

## 2017-07-11 NOTE — Telephone Encounter (Signed)
CMA call regarding patient was requesting results from her x ray   CMA told patient that she has to call at that office for her results  Patient was aware

## 2017-08-17 ENCOUNTER — Encounter: Payer: Self-pay | Admitting: Family Medicine

## 2017-08-17 ENCOUNTER — Ambulatory Visit: Payer: Self-pay | Attending: Family Medicine | Admitting: Family Medicine

## 2017-08-17 ENCOUNTER — Telehealth: Payer: Self-pay | Admitting: Family Medicine

## 2017-08-17 VITALS — BP 94/66 | HR 66 | Temp 97.8°F | Resp 18 | Ht 65.0 in | Wt 182.6 lb

## 2017-08-17 DIAGNOSIS — N946 Dysmenorrhea, unspecified: Secondary | ICD-10-CM | POA: Insufficient documentation

## 2017-08-17 DIAGNOSIS — Z791 Long term (current) use of non-steroidal anti-inflammatories (NSAID): Secondary | ICD-10-CM | POA: Insufficient documentation

## 2017-08-17 DIAGNOSIS — M722 Plantar fascial fibromatosis: Secondary | ICD-10-CM | POA: Insufficient documentation

## 2017-08-17 DIAGNOSIS — Z79899 Other long term (current) drug therapy: Secondary | ICD-10-CM | POA: Insufficient documentation

## 2017-08-17 DIAGNOSIS — R102 Pelvic and perineal pain: Secondary | ICD-10-CM | POA: Insufficient documentation

## 2017-08-17 MED ORDER — TRAMADOL HCL 50 MG PO TABS: 50.0000 mg | ORAL_TABLET | Freq: Three times a day (TID) | ORAL | 0 refills | Status: DC | PRN

## 2017-08-17 MED ORDER — NAPROXEN 500 MG PO TABS: ORAL_TABLET | ORAL | 0 refills | Status: DC

## 2017-08-17 NOTE — Progress Notes (Signed)
Subjective:  Patient ID: Tanya Kirk, female    DOB: 1976-01-15  Age: 41 y.o. MRN: [id number]  CC: Establish Care   HPI Tanya Kirk presents for c/o 2 year history of intermittent of foot pain. Reports pain is worsened by standing. She also c/o sharp pain to right lateral great toe. She denies any history of injury.  Reports taking Advil and soaking feet with minimal relief. She also c/o pelvic pain: Patient complains of abdominal pain. The pain is described as cramping, and is constant.. Pain is located in the pelvis, without radiation. Onset was several months ago. Symptoms have been gradually worsening since. Aggravating factors: menstrual cycles.  Alleviating factors: none. The patient denies any vaginal discharge, lesions, or dysuria.   Outpatient Medications Prior to Visit  Medication Sig Dispense Refill  . Ibuprofen-Diphenhydramine Cit (ADVIL PM PO) Take 1 tablet by mouth at bedtime as needed (pain).     Marland Kitchen loratadine (CLARITIN) 10 MG tablet Take 10 mg by mouth every other day.     . gabapentin (NEURONTIN) 300 MG capsule Take 1 capsule (300 mg total) by mouth at bedtime. (Patient not taking: Reported on 02/05/2017) 30 capsule 2  . ibuprofen (ADVIL,MOTRIN) 800 MG tablet Take 1 tablet (800 mg total) by mouth every 8 (eight) hours as needed for mild pain or moderate pain. (Patient not taking: Reported on 02/05/2017) 40 tablet 0  . ibuprofen (ADVIL,MOTRIN) 800 MG tablet Take 1 tablet (800 mg total) by mouth 3 (three) times daily. (Patient not taking: Reported on 04/04/2017) 21 tablet 0   No facility-administered medications prior to visit.     ROS Review of Systems  Constitutional: Negative.   Respiratory: Negative.   Cardiovascular: Negative.   Gastrointestinal: Negative.   Genitourinary: Positive for pelvic pain.  Musculoskeletal: Positive for arthralgias and myalgias.  Skin: Negative.    Objective:  BP 94/66 (BP Location: Left Arm, Patient Position: Sitting, Cuff Size: Normal)    Pulse 66   Temp 97.8 F (36.6 C) (Oral)   Resp 18   Ht 5\' 5"  (1.651 m)   Wt 182 lb 9.6 oz (82.8 kg)   SpO2 95%   BMI 30.39 kg/m   BP/Weight 08/17/2017 06/13/2017 1/91/4782  Systolic BP 94 956 213  Diastolic BP 66 76 73  Wt. (Lbs) 182.6 - 178  BMI 30.39 - 30.08   Physical Exam  Cardiovascular: Normal rate, regular rhythm, normal heart sounds and intact distal pulses.   Pulmonary/Chest: Effort normal and breath sounds normal.  Abdominal: Soft. Bowel sounds are normal. There is tenderness (mild, suprapubic).  Musculoskeletal: Normal range of motion.       Feet:  Nursing note and vitals reviewed.    Assessment & Plan:   1. Plantar fasciitis, bilateral Symptoms have not improved with conservative management will refer to orthopedics - AMB referral to orthopedics - traMADol (ULTRAM) 50 MG tablet; Take 1 tablet (50 mg total) by mouth every 8 (eight) hours as needed for severe pain. Use sparingly.  Dispense: 30 tablet; Refill: 0 - naproxen (NAPROSYN) 500 MG tablet; TAKE ONE TABLET TWICE DAY WITH MEALS FOR 10 DAYS. THEN TAKE ONE TABLET ONCE DAY WITH MEAL AS NEEDED.  Dispense: 60 tablet; Refill: 0  2. Pelvic cramping Patient unable void at this time, urine sample cannot be obtained. Future orders placed, patient told to follow up. - Urine cytology ancillary only; Future - Urinalysis, dipstick only; Future - naproxen (NAPROSYN) 500 MG tablet; TAKE ONE TABLET TWICE DAY WITH MEALS FOR  10 DAYS. THEN TAKE ONE TABLET ONCE DAY WITH MEAL AS NEEDED.  Dispense: 60 tablet; Refill: 0 - Pregnancy, urine; Future  - US Pelvis Complete; Future - US Transvaginal Non-OB; Future  3. Dysmenorrhea  - naproxen (NAPROSYN) 500 MG tablet; TAKE ONE TABLET TWICE DAY WITH MEALS FOR 10 DAYS. THEN TAKE ONE TABLET ONCE DAY WITH MEAL AS NEEDED.  Dispense: 60 tablet; Refill: 0   Meds ordered this encounter  Medications  . traMADol (ULTRAM) 50 MG tablet    Sig: Take 1 tablet (50 mg total) by mouth every 8  (eight) hours as needed for severe pain. Use sparingly.    Dispense:  30 tablet    Refill:  0    Order Specific Question:   Supervising Provider    Answer:   Tresa Garter [id number]  . naproxen (NAPROSYN) 500 MG tablet    Sig: TAKE ONE TABLET TWICE DAY WITH MEALS FOR 10 DAYS. THEN TAKE ONE TABLET ONCE DAY WITH MEAL AS NEEDED.    Dispense:  60 tablet    Refill:  0    Order Specific Question:   Supervising Provider    Answer:   Tresa Garter [id number]      Follow up: Return if symptoms worsen or fail to improve.    Alfonse Spruce FNP

## 2017-08-17 NOTE — Progress Notes (Signed)
Patient is here for foot pain   Patient stated that she has to take ibuprofen almost every day

## 2017-08-17 NOTE — Patient Instructions (Signed)
Plantar Fasciitis Plantar fasciitis is a painful foot condition that affects the heel. It occurs when the band of tissue that connects the toes to the heel bone (plantar fascia) becomes irritated. This can happen after exercising too much or doing other repetitive activities (overuse injury). The pain from plantar fasciitis can range from mild irritation to severe pain that makes it difficult for you to walk or move. The pain is usually worse in the morning or after you have been sitting or lying down for a while. What are the causes? This condition may be caused by:  Standing for long periods of time.  Wearing shoes that do not fit.  Doing high-impact activities, including running, aerobics, and ballet.  Being overweight.  Having an abnormal way of walking (gait).  Having tight calf muscles.  Having high arches in your feet.  Starting a new athletic activity.  What are the signs or symptoms? The main symptom of this condition is heel pain. Other symptoms include:  Pain that gets worse after activity or exercise.  Pain that is worse in the morning or after resting.  Pain that goes away after you walk for a few minutes.  How is this diagnosed? This condition may be diagnosed based on your signs and symptoms. Your health care provider will also do a physical exam to check for:  A tender area on the bottom of your foot.  A high arch in your foot.  Pain when you move your foot.  Difficulty moving your foot.  You may also need to have imaging studies to confirm the diagnosis. These can include:  X-rays.  Ultrasound.  MRI.  How is this treated? Treatment for plantar fasciitis depends on the severity of the condition. Your treatment may include:  Rest, ice, and over-the-counter pain medicines to manage your pain.  Exercises to stretch your calves and your plantar fascia.  A splint that holds your foot in a stretched, upward position while you sleep (night  splint).  Physical therapy to relieve symptoms and prevent problems in the future.  Cortisone injections to relieve severe pain.  Extracorporeal shock wave therapy (ESWT) to stimulate damaged plantar fascia with electrical impulses. It is often used as a last resort before surgery.  Surgery, if other treatments have not worked after 12 months.  Follow these instructions at home:  Take medicines only as directed by your health care provider.  Avoid activities that cause pain.  Roll the bottom of your foot over a bag of ice or a bottle of cold water. Do this for 20 minutes, 3-4 times a day.  Perform simple stretches as directed by your health care provider.  Try wearing athletic shoes with air-sole or gel-sole cushions or soft shoe inserts.  Wear a night splint while sleeping, if directed by your health care provider.  Keep all follow-up appointments with your health care provider. How is this prevented?  Do not perform exercises or activities that cause heel pain.  Consider finding low-impact activities if you continue to have problems.  Lose weight if you need to. The best way to prevent plantar fasciitis is to avoid the activities that aggravate your plantar fascia. Contact a health care provider if:  Your symptoms do not go away after treatment with home care measures.  Your pain gets worse.  Your pain affects your ability to move or do your daily activities. This information is not intended to replace advice given to you by your health care provider. Make sure you   discuss any questions you have with your health care provider. Document Released: 08/17/2001 Document Revised: 04/26/2016 Document Reviewed: 10/02/2014 Elsevier Interactive Patient Education  2018 [employer].   Dysmenorrhea Dysmenorrhea means painful cramps during your period (menstrual period). You will have pain in your lower belly (abdomen). The pain is caused by the tightening (contracting) of the  muscles of the womb (uterus). The pain may be mild or very bad. With this condition, you may:  Have a headache.  Feel sick to your stomach (nauseous).  Throw up (vomit).  Have lower back pain.  Follow these instructions at home: Helping pain and cramping  Put heat on your lower back or belly when you have pain or cramps. Use the heat source that your doctor tells you to use. ? Place a towel between your skin and the heat. ? Leave the heat on for 20-30 minutes. ? Remove the heat if your skin turns bright red. This is especially important if you cannot feel pain, heat, or cold. ? Do not have a heating pad on during sleep.  Do aerobic exercises. These include walking, swimming, or biking. These may help with cramps.  Massage your lower back or belly. This may help lessen pain. General instructions  Take over-the-counter and prescription medicines only as told by your doctor.  Do not drive or use heavy machinery while taking prescription pain medicine.  Avoid alcohol and caffeine during and right before your period. These can make cramps worse.  Do not use any products that have nicotine or tobacco. These include cigarettes and e-cigarettes. If you need help quitting, ask your doctor.  Keep all follow-up visits as told by your doctor. This is important. Contact a doctor if:  You have pain that gets worse.  You have pain that does not get better with medicine.  You have pain during sex.  You feel sick to your stomach or you throw up during your period, and medicine does not help. Get help right away if:  You pass out (faint). Summary  Dysmenorrhea means painful cramps during your period (menstrual period).  Put heat on your lower back or belly when you have pain or cramps.  Do exercises like walking, swimming, or biking to help with cramps.  Contact a doctor if you have pain during sex. This information is not intended to replace advice given to you by your health  care provider. Make sure you discuss any questions you have with your health care provider. Document Released: 02/18/2009 Document Revised: 12/09/2016 Document Reviewed: 12/09/2016 Elsevier Interactive Patient Education  2017 [employer].

## 2017-08-17 NOTE — Progress Notes (Deleted)
   Subjective:  Patient ID: Tanya Kirk, female    DOB: 10/10/1976  Age: 41 y.o. MRN: [id number]  CC: Establish Care   HPI Tanya Kirk presents for   Worsening  Constant   Cramping  Regular   Pevlis/ cont    Outpatient Medications Prior to Visit  Medication Sig Dispense Refill  . gabapentin (NEURONTIN) 300 MG capsule Take 1 capsule (300 mg total) by mouth at bedtime. (Patient not taking: Reported on 02/05/2017) 30 capsule 2  . ibuprofen (ADVIL,MOTRIN) 800 MG tablet Take 1 tablet (800 mg total) by mouth every 8 (eight) hours as needed for mild pain or moderate pain. (Patient not taking: Reported on 02/05/2017) 40 tablet 0  . ibuprofen (ADVIL,MOTRIN) 800 MG tablet Take 1 tablet (800 mg total) by mouth 3 (three) times daily. (Patient not taking: Reported on 04/04/2017) 21 tablet 0  . Ibuprofen-Diphenhydramine Cit (ADVIL PM PO) Take 1 tablet by mouth at bedtime as needed (pain).     Marland Kitchen loratadine (CLARITIN) 10 MG tablet Take 10 mg by mouth every other day.      No facility-administered medications prior to visit.     ROS Review of Systems     Objective:  BP 94/66 (BP Location: Left Arm, Patient Position: Sitting, Cuff Size: Normal)   Pulse 66   Temp 97.8 F (36.6 C) (Oral)   Resp 18   Ht 5\' 5"  (1.651 m)   Wt 182 lb 9.6 oz (82.8 kg)   SpO2 95%   BMI 30.39 kg/m   BP/Weight 08/17/2017 06/13/2017 3/97/6734  Systolic BP 94 193 790  Diastolic BP 66 76 73  Wt. (Lbs) 182.6 - 178  BMI 30.39 - 30.08     Physical Exam   Assessment & Plan:   Problem List Items Addressed This Visit    None      No orders of the defined types were placed in this encounter.   Follow-up: No Follow-up on file.   Alfonse Spruce FNP

## 2017-08-18 ENCOUNTER — Telehealth: Payer: Self-pay

## 2017-08-18 NOTE — Telephone Encounter (Signed)
CMA call regarding [place] appt on 08/24/17 @ [facility] @ 3:30 pm with a full bladder  Patient did not answer spoke with kim left her a message to contact me back to the office

## 2017-08-24 ENCOUNTER — Ambulatory Visit (HOSPITAL_COMMUNITY): Payer: Self-pay

## 2017-09-08 ENCOUNTER — Ambulatory Visit: Payer: Self-pay | Attending: Family Medicine

## 2017-09-14 ENCOUNTER — Ambulatory Visit (HOSPITAL_COMMUNITY)
Admission: RE | Admit: 2017-09-14 | Discharge: 2017-09-14 | Disposition: A | Discharge status: Home / Self Care | Payer: Self-pay | Source: Ambulatory Visit | Attending: Family Medicine | Admitting: Family Medicine

## 2017-09-14 ENCOUNTER — Telehealth: Payer: Self-pay | Admitting: Family Medicine

## 2017-09-14 ENCOUNTER — Ambulatory Visit: Payer: Self-pay

## 2017-09-14 DIAGNOSIS — R102 Pelvic and perineal pain: Secondary | ICD-10-CM | POA: Insufficient documentation

## 2017-09-14 NOTE — Telephone Encounter (Signed)
Reviewed Controlled Substance database. Last prescription filled was for oxycodone 09/14/16 so it was year ago. I do not see any record of where the new oxycodone prescription came from. Will defer to PCP

## 2017-09-14 NOTE — Telephone Encounter (Signed)
[facility] called requesting clarification on rx for  traMADol (ULTRAM) 50 MG tablet, pt was also prescribed oxycodone the same date and they would like to know if its okay to fill. Please f/up

## 2017-09-15 NOTE — Telephone Encounter (Signed)
[place] and spoke with associated named Inez Catalina to follow up. Once reviewed it showed last script for oxycodone was 09/14/2016. None recently. Okay to Tramadol medication. [facility] is aware.

## 2017-09-21 ENCOUNTER — Ambulatory Visit (HOSPITAL_COMMUNITY)
Admission: EM | Admit: 2017-09-21 | Discharge: 2017-09-21 | Disposition: A | Discharge status: Home / Self Care | Payer: Self-pay | Attending: Family Medicine | Admitting: Family Medicine

## 2017-09-21 ENCOUNTER — Encounter (HOSPITAL_COMMUNITY): Payer: Self-pay | Admitting: Family Medicine

## 2017-09-21 ENCOUNTER — Telehealth: Payer: Self-pay | Admitting: Family Medicine

## 2017-09-21 DIAGNOSIS — Z3202 Encounter for pregnancy test, result negative: Secondary | ICD-10-CM

## 2017-09-21 DIAGNOSIS — Z9049 Acquired absence of other specified parts of digestive tract: Secondary | ICD-10-CM | POA: Insufficient documentation

## 2017-09-21 DIAGNOSIS — F17210 Nicotine dependence, cigarettes, uncomplicated: Secondary | ICD-10-CM | POA: Insufficient documentation

## 2017-09-21 DIAGNOSIS — F502 Bulimia nervosa: Secondary | ICD-10-CM | POA: Insufficient documentation

## 2017-09-21 DIAGNOSIS — F329 Major depressive disorder, single episode, unspecified: Secondary | ICD-10-CM | POA: Insufficient documentation

## 2017-09-21 DIAGNOSIS — N898 Other specified noninflammatory disorders of vagina: Secondary | ICD-10-CM | POA: Insufficient documentation

## 2017-09-21 LAB — POCT PREGNANCY, URINE: Preg Test, Ur: NEGATIVE

## 2017-09-21 MED ORDER — AZITHROMYCIN 250 MG PO TABS
ORAL_TABLET | ORAL | Status: AC
  Filled 2017-09-21: qty 4

## 2017-09-21 MED ORDER — AZITHROMYCIN 250 MG PO TABS
1000.0000 mg | ORAL_TABLET | Freq: Once | ORAL | Status: AC
  Administered 2017-09-21: 1000 mg via ORAL

## 2017-09-21 MED ORDER — FLUCONAZOLE 150 MG PO TABS: 150.0000 mg | ORAL_TABLET | Freq: Every day | ORAL | 0 refills | Status: DC

## 2017-09-21 MED ORDER — CEFTRIAXONE SODIUM 250 MG IJ SOLR
250.0000 mg | Freq: Once | INTRAMUSCULAR | Status: AC
  Administered 2017-09-21: 250 mg via INTRAMUSCULAR

## 2017-09-21 MED ORDER — CEFTRIAXONE SODIUM 250 MG IJ SOLR
INTRAMUSCULAR | Status: AC
  Filled 2017-09-21: qty 250

## 2017-09-21 NOTE — Telephone Encounter (Signed)
Pt called states she received a call no note in system. But pt would like to know what would be the next step for her ,would she need a referral to a specialist about her plantar fascitis.

## 2017-09-21 NOTE — Discharge Instructions (Signed)
Urine pregnancy test negative. You were treated empirically for gonorrhea, Chlamydia and yeast. Azithromycin 1 g by mouth and Rocephin 250 mg injection given today. Start Diflucan as directed. Cytology sent, you will be contacted with any positive results that requires further treatment. Refrain from sexual activity for the next 7 days. Monitor for any worsening of symptoms, fever, abdominal pain, nausea, vomiting, to follow up for reevaluation.

## 2017-09-21 NOTE — ED Triage Notes (Signed)
Pt here for vaginal itching, irritation, and odor.

## 2017-09-21 NOTE — ED Provider Notes (Signed)
**Note De-Identified Tanya Obfuscation** [place]    CSN: [id number] Arrival date & time: 09/21/17  1303     History   Chief Complaint Chief Complaint  Patient presents with  . Vaginal Discharge    HPI Tanya Kirk is a 41 y.o. female.   41 year old female with history of bulimia, depression, cholecystectomy comes in for 2 week history of vaginal itching, irritation and odor. She states that she has a history of rash around the groin area that looks like "jock itch" and has been applying monistat cream and has since resolved. Denies urinary symptoms such as urinary frequency, dysuria, hematuria. Had abdominal pain before and during cycle 2 weeks ago from cramping. Patient states cramping during cycles has been new to her and her PCP ordered an ultrasound for assessment and will be going through results with her in the near future. Denies current abdominal pain, nausea, vomiting. Denies fevers, chills, night sweats. Sexually active with 4 partners currently, with intermittent condom use.       Past Medical History:  Diagnosis Date  . Bulimia   . Depression     Patient Active Problem List   Diagnosis Date Noted  . Cholecystitis 09/11/2016    Past Surgical History:  Procedure Laterality Date  . CHOLECYSTECTOMY N/A 09/12/2016   Procedure: LAPAROSCOPIC CHOLECYSTECTOMY;  Surgeon: Leighton Ruff, MD;  Location: [facility];  Service: General;  Laterality: N/A;    OB History    Gravida Para Term Preterm AB Living   1       1 0   SAB TAB Ectopic Multiple Live Births     1             Home Medications    Prior to Admission medications   Medication Sig Start Date End Date Taking? Authorizing Provider  fluconazole (DIFLUCAN) 150 MG tablet Take 1 tablet (150 mg total) by mouth daily. Take second dose 72 hours later if symptoms still persists. 09/21/17   Tasia Catchings, Amy V, PA-C  Ibuprofen-Diphenhydramine Cit (ADVIL PM PO) Take 1 tablet by mouth at bedtime as needed (pain).     [provider]  loratadine  (CLARITIN) 10 MG tablet Take 10 mg by mouth every other day.     [provider]  naproxen (NAPROSYN) 500 MG tablet TAKE ONE TABLET TWICE DAY WITH MEALS FOR 10 DAYS. THEN TAKE ONE TABLET ONCE DAY WITH MEAL AS NEEDED. 08/17/17   Alfonse Spruce, FNP  traMADol (ULTRAM) 50 MG tablet Take 1 tablet (50 mg total) by mouth every 8 (eight) hours as needed for severe pain. Use sparingly. 08/17/17   Alfonse Spruce, FNP    Family History Family History  Problem Relation Age of Onset  . Cancer Other   . Diabetes Other     Social History Social History  Substance Use Topics  . Smoking status: Current Some Day Smoker    Packs/day: 0.10    Types: Cigarettes  . Smokeless tobacco: Never Used  . Alcohol use Yes     Comment: occational     Allergies   Tetracyclines & related and Other   Review of Systems Review of Systems  Reason unable to perform ROS: See HPI as above.     Physical Exam Triage Vital Signs ED Triage Vitals [09/21/17 1320]  Enc Vitals Group     BP (!) 166/53     Pulse Rate 80     Resp 18     Temp 98.6 F (37 C)  Temp src      SpO2 98 %     Weight      Height      Head Circumference      Peak Flow      Pain Score      Pain Loc      Pain Edu?      Excl. in [place]?    No data found.   Updated Vital Signs BP (!) 166/53   Pulse 80   Temp 98.6 F (37 C)   Resp 18   LMP 09/07/2017   SpO2 98%   Physical Exam  Constitutional: She is oriented to person, place, and time. She appears well-developed and well-nourished. No distress.  HENT:  Head: Normocephalic and atraumatic.  Eyes: Pupils are equal, round, and reactive to light. Conjunctivae are normal.  Cardiovascular: Normal rate, regular rhythm and normal heart sounds.  Exam reveals no gallop and no friction rub.   No murmur heard. Pulmonary/Chest: Effort normal and breath sounds normal. She has no wheezes. She has no rales.  Abdominal: Soft. Bowel sounds are normal. She exhibits no mass.  There is no tenderness. There is no rebound, no guarding and no CVA tenderness.  Genitourinary:  Genitourinary Comments: Deferred. Patient self swabbed for cytology  Neurological: She is alert and oriented to person, place, and time.  Skin: Skin is warm and dry.  Psychiatric: She has a normal mood and affect. Her behavior is normal. Judgment normal.     UC Treatments / Results  Labs (all labs ordered are listed, but only abnormal results are displayed) Labs Reviewed  POCT PREGNANCY, URINE  CERVICOVAGINAL ANCILLARY ONLY    EKG  EKG Interpretation None       Radiology No results found.  Procedures Procedures (including critical care time)  Medications Ordered in UC Medications  azithromycin (ZITHROMAX) tablet 1,000 mg (1,000 mg Oral Given 09/21/17 1411)  cefTRIAXone (ROCEPHIN) injection 250 mg (250 mg Intramuscular Given 09/21/17 1411)     Initial Impression / Assessment and Plan / UC Course  I have reviewed the triage vital signs and the nursing notes.  Pertinent labs & imaging results that were available during my care of the patient were reviewed by me and considered in my medical decision making (see chart for details).     Urine pregnancy negative. Patient was treated empirically for GC, yeast. Rocephin and azithromycin given in office today. Diflucan as directed. Cytology sent, patient will be contacted with any positive results that require additional treatment. Patient to refrain from sexual activity for the next 7 days. Patient to follow up with PCP as scheduled. Return precautions given.  Final Clinical Impressions(s) / UC Diagnoses   Final diagnoses:  Vaginal discharge    New Prescriptions Discharge Medication List as of 09/21/2017  2:02 PM    START taking these medications   Details  fluconazole (DIFLUCAN) 150 MG tablet Take 1 tablet (150 mg total) by mouth daily. Take second dose 72 hours later if symptoms still persists., Starting Wed 09/21/2017,  Normal          Cathlean Sauer V, PA-C 09/21/17 1423

## 2017-09-22 LAB — CERVICOVAGINAL ANCILLARY ONLY
BACTERIAL VAGINITIS: NEGATIVE
CANDIDA VAGINITIS: POSITIVE — AB
Chlamydia: NEGATIVE
NEISSERIA GONORRHEA: NEGATIVE
Trichomonas: NEGATIVE

## 2017-09-22 NOTE — Telephone Encounter (Signed)
CMA call regarding to f/up with her urine collection   Patient stated that she is coming this week or next just to get her urine collected The orders are already in

## 2017-10-04 ENCOUNTER — Other Ambulatory Visit: Payer: Self-pay | Admitting: Family Medicine

## 2017-10-04 DIAGNOSIS — D269 Other benign neoplasm of uterus, unspecified: Secondary | ICD-10-CM

## 2017-10-06 NOTE — Telephone Encounter (Signed)
CMA call regarding lab results   Patient verify DOB  Patient was aware and understood  

## 2017-10-06 NOTE — Telephone Encounter (Signed)
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 You will be referred to gynecology.

## 2017-10-17 ENCOUNTER — Encounter: Payer: Self-pay | Admitting: Obstetrics and Gynecology

## 2017-10-24 ENCOUNTER — Telehealth (HOSPITAL_BASED_OUTPATIENT_CLINIC_OR_DEPARTMENT_OTHER): Payer: Self-pay | Admitting: *Deleted

## 2017-10-24 ENCOUNTER — Ambulatory Visit: Payer: Self-pay

## 2017-10-24 DIAGNOSIS — Z113 Encounter for screening for infections with a predominantly sexual mode of transmission: Secondary | ICD-10-CM

## 2017-10-24 DIAGNOSIS — R300 Dysuria: Secondary | ICD-10-CM

## 2017-10-24 DIAGNOSIS — R102 Pelvic and perineal pain: Secondary | ICD-10-CM

## 2017-10-24 LAB — POCT URINALYSIS DIPSTICK
BILIRUBIN UA: NEGATIVE
Glucose, UA: NEGATIVE
Ketones, UA: NEGATIVE
Leukocytes, UA: NEGATIVE
NITRITE UA: NEGATIVE
PH UA: 6 (ref 5.0–8.0)
UROBILINOGEN UA: 1 U/dL

## 2017-10-24 LAB — POCT URINE PREGNANCY: Preg Test, Ur: NEGATIVE

## 2017-10-24 NOTE — Telephone Encounter (Signed)
Pt came to [facility] to drop off urine specimen  from orders at [place] 08/17/2017.

## 2017-10-24 NOTE — Telephone Encounter (Signed)
Pt. Called stating she needs an Rx for Tramadol. Pt. States she never got the one for September. Please f/u

## 2017-10-24 NOTE — Progress Notes (Signed)
Patient here for lab visit  

## 2017-10-26 LAB — URINE CYTOLOGY ANCILLARY ONLY
Chlamydia: NEGATIVE
Neisseria Gonorrhea: NEGATIVE
TRICH (WINDOWPATH): NEGATIVE

## 2017-10-31 ENCOUNTER — Encounter (HOSPITAL_COMMUNITY): Payer: Self-pay

## 2017-10-31 ENCOUNTER — Other Ambulatory Visit: Payer: Self-pay

## 2017-10-31 DIAGNOSIS — N760 Acute vaginitis: Secondary | ICD-10-CM | POA: Insufficient documentation

## 2017-10-31 DIAGNOSIS — F17210 Nicotine dependence, cigarettes, uncomplicated: Secondary | ICD-10-CM | POA: Insufficient documentation

## 2017-10-31 DIAGNOSIS — Z79899 Other long term (current) drug therapy: Secondary | ICD-10-CM | POA: Insufficient documentation

## 2017-10-31 LAB — URINALYSIS, ROUTINE W REFLEX MICROSCOPIC
Bilirubin Urine: NEGATIVE
GLUCOSE, UA: NEGATIVE mg/dL
Ketones, ur: NEGATIVE mg/dL
Leukocytes, UA: NEGATIVE
NITRITE: NEGATIVE
PH: 7 (ref 5.0–8.0)
PROTEIN: NEGATIVE mg/dL
SPECIFIC GRAVITY, URINE: 1.026 (ref 1.005–1.030)

## 2017-10-31 LAB — URINE CYTOLOGY ANCILLARY ONLY
BACTERIAL VAGINITIS: NEGATIVE
CANDIDA VAGINITIS: NEGATIVE

## 2017-10-31 LAB — POC URINE PREG, ED: Preg Test, Ur: NEGATIVE

## 2017-10-31 NOTE — ED Triage Notes (Signed)
Pt reports vaginal irritation x several days that became worse after unprotected intercourse. PT states she gets BV and yeast infections frequently. Reports using 7 days of OTC yeast medication with no improvement.

## 2017-11-01 ENCOUNTER — Emergency Department (HOSPITAL_COMMUNITY)
Admission: EM | Admit: 2017-11-01 | Discharge: 2017-11-01 | Disposition: A | Discharge status: Home / Self Care | Payer: Self-pay | Attending: Emergency Medicine | Admitting: Emergency Medicine

## 2017-11-01 DIAGNOSIS — N760 Acute vaginitis: Secondary | ICD-10-CM

## 2017-11-01 LAB — WET PREP, GENITAL
CLUE CELLS WET PREP: NONE SEEN
Sperm: NONE SEEN
Trich, Wet Prep: NONE SEEN
Yeast Wet Prep HPF POC: NONE SEEN

## 2017-11-01 LAB — GC/CHLAMYDIA PROBE AMP (~~LOC~~) NOT AT ARMC
Chlamydia: NEGATIVE
NEISSERIA GONORRHEA: NEGATIVE

## 2017-11-01 LAB — HIV ANTIBODY (ROUTINE TESTING W REFLEX): HIV Screen 4th Generation wRfx: NONREACTIVE

## 2017-11-01 LAB — RPR: RPR: NONREACTIVE

## 2017-11-01 MED ORDER — CEFTRIAXONE SODIUM 250 MG IJ SOLR
250.0000 mg | Freq: Once | INTRAMUSCULAR | Status: AC
  Administered 2017-11-01: 250 mg via INTRAMUSCULAR
  Filled 2017-11-01: qty 250

## 2017-11-01 MED ORDER — LIDOCAINE HCL (PF) 1 % IJ SOLN
INTRAMUSCULAR | Status: AC
  Administered 2017-11-01: 5 mL
  Filled 2017-11-01: qty 5

## 2017-11-01 MED ORDER — AZITHROMYCIN 250 MG PO TABS
1000.0000 mg | ORAL_TABLET | Freq: Once | ORAL | Status: AC
  Administered 2017-11-01: 1000 mg via ORAL
  Filled 2017-11-01: qty 4

## 2017-11-01 NOTE — Discharge Instructions (Signed)
°  [facility] °[email or web address] °[address] °[place], [place] °[phone]  ° ° °[facility] °[email or web address] °[address] °[place], [place] °[phone]  ° ° °[facility] °[address] °[place], [place] °[phone]  ° °[facility] °[email or web address] °[address] °[place], [place] [phone]  ° °[facility] °[email or web address] °[address] °[place], [place] °[phone]  ° °[facility] °[email or web address] °[address] °[place], [place] °[phone] ° °

## 2017-11-01 NOTE — ED Provider Notes (Signed)
TIME SEEN: 5:32 AM  CHIEF COMPLAINT: Vaginal discharge, odor  HPI: Patient is a 41 year old G1 P0 A1 who presents the emergency department with complaints of several days of vaginal discharge, odor and itching.  States she thought she had a yeast infection has been using Monistat without any relief.  States she has had bacterial vaginosis before and this feels somewhat similar.  She is also requesting a today.  Sexually active with one female partner.  Has had an STD many years ago.  Has had some dysuria.  No hematuria.  No abdominal pain.  No fevers, nausea, vomiting or diarrhea.  ROS: See HPI Constitutional: no fever  Eyes: no drainage  ENT: no runny nose   Cardiovascular:  no chest pain  Resp: no SOB  GI: no vomiting GU:  dysuria Integumentary: no rash  Allergy: no hives  Musculoskeletal: no leg swelling  Neurological: no slurred speech ROS otherwise negative  PAST MEDICAL HISTORY/PAST SURGICAL HISTORY:  Past Medical History:  Diagnosis Date  . Bulimia   . Depression     MEDICATIONS:  Prior to Admission medications   Medication Sig Start Date End Date Taking? Authorizing Provider  fluconazole (DIFLUCAN) 150 MG tablet Take 1 tablet (150 mg total) by mouth daily. Take second dose 72 hours later if symptoms still persists. 09/21/17   Tasia Catchings, Amy V, PA-C  Ibuprofen-Diphenhydramine Cit (ADVIL PM PO) Take 1 tablet by mouth at bedtime as needed (pain).     [provider]  loratadine (CLARITIN) 10 MG tablet Take 10 mg by mouth every other day.     [provider]  naproxen (NAPROSYN) 500 MG tablet TAKE ONE TABLET TWICE DAY WITH MEALS FOR 10 DAYS. THEN TAKE ONE TABLET ONCE DAY WITH MEAL AS NEEDED. 08/17/17   Alfonse Spruce, FNP  traMADol (ULTRAM) 50 MG tablet Take 1 tablet (50 mg total) by mouth every 8 (eight) hours as needed for severe pain. Use sparingly. 08/17/17   Alfonse Spruce, FNP    ALLERGIES:  Allergies  Allergen Reactions  . Tetracyclines & Related  Anaphylaxis, Itching, Swelling and Other (See Comments)    Airway involvement.    . Other     Dogs: Itching     SOCIAL HISTORY:  Social History   Tobacco Use  . Smoking status: Current Some Day Smoker    Packs/day: 0.10    Types: Cigarettes  . Smokeless tobacco: Never Used  Substance Use Topics  . Alcohol use: Yes    Comment: occational    FAMILY HISTORY: Family History  Problem Relation Age of Onset  . Cancer Other   . Diabetes Other     EXAM: BP 108/75 (BP Location: Right Arm)   Pulse 64   Temp 98.1 F (36.7 C) (Oral)   Resp 18   SpO2 99%  CONSTITUTIONAL: Alert and oriented and responds appropriately to questions. Well-appearing; well-nourished HEAD: Normocephalic EYES: Conjunctivae clear, pupils appear equal, EOMI ENT: normal nose; moist mucous membranes NECK: Supple, no meningismus, no nuchal rigidity, no LAD  CARD: RRR; S1 and S2 appreciated; no murmurs, no clicks, no rubs, no gallops RESP: Normal chest excursion without splinting or tachypnea; breath sounds clear and equal bilaterally; no wheezes, no rhonchi, no rales, no hypoxia or respiratory distress, speaking full sentences ABD/GI: Normal bowel sounds; non-distended; soft, non-tender, no rebound, no guarding, no peritoneal signs, no hepatosplenomegaly GU: Patient has erythema and mild swelling noted to bilateral labia majora without any lesions or rashes.. Patient has no vaginal bleeding  on exam.  Thick white vaginal discharge mixed with a small amount of blood.  No adnexal tenderness, mass or fullness, no cervical motion tenderness. Cervix is not appear friable.  Cervix is closed.  Chaperone present for exam. BACK:  The back appears normal and is non-tender to palpation, there is no CVA tenderness EXT: Normal ROM in all joints; non-tender to palpation; no edema; normal capillary refill; no cyanosis, no calf tenderness or swelling    SKIN: Normal color for age and race; warm; no rash NEURO: Moves all extremities  equally PSYCH: The patient's mood and manner are appropriate. Grooming and personal hygiene are appropriate.  MEDICAL DECISION MAKING: Patient here with vaginitis.  Will perform STD screening.  Urine shows no sign of infection and pregnancy test negative.  Abdominal exam benign.  Doubt torsion, TOA, PID based on exam.  ED PROGRESS: Patient's wet prep shows white blood cells but no other abnormality.  Given she is concern for STD exposure will treat empirically with ceftriaxone and azithromycin to cover for gonorrhea and chlamydia.  Discussed with her that her STD testing is pending and she will be contacted if it is positive and her partner will need to be treated as well.  I recommended she stop her Monistat and avoid any other topical medications as these may be making her vaginitis worse.  Will give outpatient OB/GYN follow-up.  Discussed return precautions.  At this time, I do not feel there is any life-threatening condition present. I have reviewed and discussed all results (EKG, imaging, lab, urine as appropriate) and exam findings with patient/family. I have reviewed nursing notes and appropriate previous records.  I feel the patient is safe to be discharged home without further emergent workup and can continue workup as an outpatient as needed. Discussed usual and customary return precautions. Patient/family verbalize understanding and are comfortable with this plan.  Outpatient follow-up has been provided if needed. All questions have been answered.      Tabbitha Janvrin, Delice Bison, DO 11/01/17 [phone]

## 2017-11-01 NOTE — ED Notes (Signed)
Patient with c/o Vaginal discharge and rash reported per patient with history of same.  Patient tried over the counter veast treatment without relief.

## 2017-11-03 ENCOUNTER — Telehealth: Payer: Self-pay | Admitting: Family Medicine

## 2017-11-03 ENCOUNTER — Other Ambulatory Visit: Payer: Self-pay | Admitting: Family Medicine

## 2017-11-03 NOTE — Telephone Encounter (Signed)
Called and spoke with pharmacy associate Rosann Auerbach regarding patient's Tramadol prescription. She reports it was noted that patient was given back prescription script. [place] Controlled Substance Reporting System was checked. No tramadol filled since script was given.

## 2017-11-03 NOTE — Telephone Encounter (Signed)
Left message for patient to call back  

## 2017-11-04 NOTE — Telephone Encounter (Signed)
Called no answer. No voicemail setup.

## 2017-11-04 NOTE — Telephone Encounter (Signed)
[phone] Number on file is not patient's number.

## 2017-11-04 NOTE — Telephone Encounter (Signed)
Multiple attempts made to contact patient unsuccessfully. Will have CMA or nurse make another attempt. Willing to give patient another prescription script encourage her to fill it as soon as possible. No additional scripts will be given if  misplaced. Labs resulted.

## 2017-11-07 ENCOUNTER — Encounter: Payer: Self-pay | Admitting: Obstetrics and Gynecology

## 2017-11-07 ENCOUNTER — Ambulatory Visit: Payer: Self-pay | Admitting: Obstetrics and Gynecology

## 2017-11-07 VITALS — BP 99/66 | HR 79 | Ht 64.5 in | Wt 183.0 lb

## 2017-11-07 DIAGNOSIS — N800 Endometriosis of uterus: Secondary | ICD-10-CM

## 2017-11-07 DIAGNOSIS — N946 Dysmenorrhea, unspecified: Secondary | ICD-10-CM

## 2017-11-07 DIAGNOSIS — N8003 Adenomyosis of the uterus: Secondary | ICD-10-CM

## 2017-11-07 DIAGNOSIS — R102 Pelvic and perineal pain: Secondary | ICD-10-CM

## 2017-11-07 DIAGNOSIS — N809 Endometriosis, unspecified: Secondary | ICD-10-CM

## 2017-11-07 NOTE — Patient Instructions (Addendum)
Take 600 mg of Motrin every 6 hours starting 2 days before your period. Take it scheduled whether or not you are cramping. Do not take other NSAIDs at the same time, which includes Naproxen, Motrin, Aspirin, Aleve.

## 2017-11-07 NOTE — Progress Notes (Signed)
GYNECOLOGY OFFICE VISIT NOTE  History:  41 y.o. G1P0010 here today for significantly increased pain with periods. Only has pain directly prior to periods and during. Has slowly increased in pain over time. Feels like cramping and stabbing in uterus.  She denies any abnormal vaginal discharge, bleeding, or other concerns. Monthly regular periods, lasting 4-7 days. Uses condoms regularly, no other forms of birth control. Used CHCs and Norplant many years ago. Denies pain with intercourse.   Past Medical History:  Diagnosis Date  . Bulimia   . Depression     Past Surgical History:  Procedure Laterality Date  . CHOLECYSTECTOMY N/A 09/12/2016   Procedure: LAPAROSCOPIC CHOLECYSTECTOMY;  Surgeon: Leighton Ruff, MD;  Location: [facility];  Service: General;  Laterality: N/A;    Current Outpatient Medications:  .  fluconazole (DIFLUCAN) 150 MG tablet, Take 1 tablet (150 mg total) by mouth daily. Take second dose 72 hours later if symptoms still persists. (Patient not taking: Reported on 11/07/2017), Disp: 2 tablet, Rfl: 0 .  Ibuprofen-Diphenhydramine Cit (ADVIL PM PO), Take 1 tablet by mouth at bedtime as needed (pain). , Disp: , Rfl:  .  loratadine (CLARITIN) 10 MG tablet, Take 10 mg by mouth every other day. , Disp: , Rfl:  .  naproxen (NAPROSYN) 500 MG tablet, TAKE ONE TABLET TWICE DAY WITH MEALS FOR 10 DAYS. THEN TAKE ONE TABLET ONCE DAY WITH MEAL AS NEEDED. (Patient not taking: Reported on 11/07/2017), Disp: 60 tablet, Rfl: 0 .  traMADol (ULTRAM) 50 MG tablet, Take 1 tablet (50 mg total) by mouth every 8 (eight) hours as needed for severe pain. Use sparingly. (Patient not taking: Reported on 11/07/2017), Disp: 30 tablet, Rfl: 0  The following portions of the patient's history were reviewed and updated as appropriate: allergies, current medications, past family history, past medical history, past social history, past surgical history and problem list.   Health Maintenance:  Last pap: 02/2016,  negative with negative hr HPV Last mammogram: gave info for reduced fee mammogram, none in past   Review of Systems:  Pertinent items noted in HPI and remainder of comprehensive ROS otherwise negative.   Objective:  Physical Exam BP 99/66   Pulse 79   Ht 5' 4.5" (1.638 m)   Wt 183 lb (83 kg)   LMP 10/26/2017 (Within Days)   BMI 30.93 kg/m  CONSTITUTIONAL: Well-developed, well-nourished female in no acute distress.  HENT:  Normocephalic, atraumatic. External right and left ear normal. Oropharynx is clear and moist EYES: Conjunctivae and EOM are normal. Pupils are equal, round, and reactive to light. No scleral icterus.  NECK: Normal range of motion, supple, no masses SKIN: Skin is warm and dry. No rash noted. Not diaphoretic. No erythema. No pallor. NEUROLOGIC: Alert and oriented to person, place, and time. Normal reflexes, muscle tone coordination. No cranial nerve deficit noted. PSYCHIATRIC: Normal mood and affect. Normal behavior. Normal judgment and thought content. CARDIOVASCULAR: Normal heart rate noted RESPIRATORY: Effort and breath sounds normal, no problems with respiration noted ABDOMEN: Soft, no distention noted.   PELVIC: Normal appearing external genitalia; normal appearing vaginal mucosa and cervix.  No abnormal discharge noted.  Normal uterine size, no other palpable masses, no uterine or adnexal tenderness. MUSCULOSKELETAL: Normal range of motion. No edema noted.  Labs and Imaging No results found.  Assessment & Plan:  1. Dysmenorrhea Reviewed etiology, prognosis of dysmenorrhea. Reviewed options for treatment including NSAIDs, hormonal therapy and surgical management. Recommended she start with NSAIDs and if no improvement, will  try hormonal therapy. She is not interested in surgery at this time. She is agreeable to this plan, to start motrin 600 mg Q6 starting two days before her periods. She verbalizes understanding that she is not to take other NSAIDs during this  time. Return 3 months if no improvement.   2. Adenomyosis   3. Pelvic pain   Routine preventative health maintenance measures emphasized. Please refer to After Visit Summary for other counseling recommendations.   Return in about 3 months (around 02/05/2018), or if symptoms worsen or fail to improve.    Feliz Beam, M.D. Attending [place], [facility] for [employer], [place]

## 2017-11-08 ENCOUNTER — Encounter: Payer: Self-pay | Admitting: Obstetrics and Gynecology

## 2017-11-08 NOTE — Telephone Encounter (Signed)
CMA call patient regarding results   Patient no answer but somebody else did so I left a message stating for patient to call back at the clinic

## 2017-11-08 NOTE — Telephone Encounter (Signed)
-----   Message from Alfonse Spruce, [place] sent at 11/04/2017 12:54 PM EST ----- Gonorrhea, Chlamydia, BV and Yeast, and Trichomonas were all negative. Previous urine screen showed some blood and protein however most recent lab is normal.

## 2017-11-08 NOTE — Telephone Encounter (Signed)
Tanya Spruce, FNP  Marvetta Gibbons, CMA  Cc: Tanya Goodpasture, RN        Gonorrhea, Chlamydia, BV and Yeast, and Trichomonas were all negative.  Previous urine screen showed some blood and protein however most recent lab is normal.    Unable to leave message on voicemail. Will mail letter for patient to return call.

## 2017-12-12 DIAGNOSIS — D269 Other benign neoplasm of uterus, unspecified: Secondary | ICD-10-CM

## 2017-12-16 ENCOUNTER — Ambulatory Visit (HOSPITAL_COMMUNITY)
Admission: EM | Admit: 2017-12-16 | Discharge: 2017-12-16 | Disposition: A | Discharge status: Home / Self Care | Payer: Self-pay | Attending: Internal Medicine | Admitting: Internal Medicine

## 2017-12-16 ENCOUNTER — Encounter (HOSPITAL_COMMUNITY): Payer: Self-pay | Admitting: Family Medicine

## 2017-12-16 DIAGNOSIS — Z3202 Encounter for pregnancy test, result negative: Secondary | ICD-10-CM

## 2017-12-16 DIAGNOSIS — N898 Other specified noninflammatory disorders of vagina: Secondary | ICD-10-CM

## 2017-12-16 DIAGNOSIS — Z711 Person with feared health complaint in whom no diagnosis is made: Secondary | ICD-10-CM

## 2017-12-16 DIAGNOSIS — Z9049 Acquired absence of other specified parts of digestive tract: Secondary | ICD-10-CM | POA: Insufficient documentation

## 2017-12-16 DIAGNOSIS — F17210 Nicotine dependence, cigarettes, uncomplicated: Secondary | ICD-10-CM | POA: Insufficient documentation

## 2017-12-16 DIAGNOSIS — Z202 Contact with and (suspected) exposure to infections with a predominantly sexual mode of transmission: Secondary | ICD-10-CM

## 2017-12-16 DIAGNOSIS — N760 Acute vaginitis: Secondary | ICD-10-CM | POA: Insufficient documentation

## 2017-12-16 DIAGNOSIS — Z113 Encounter for screening for infections with a predominantly sexual mode of transmission: Secondary | ICD-10-CM

## 2017-12-16 DIAGNOSIS — Z881 Allergy status to other antibiotic agents status: Secondary | ICD-10-CM | POA: Insufficient documentation

## 2017-12-16 LAB — POCT URINALYSIS DIP (DEVICE)
BILIRUBIN URINE: NEGATIVE
GLUCOSE, UA: 250 mg/dL — AB
Leukocytes, UA: NEGATIVE
Nitrite: NEGATIVE
PROTEIN: NEGATIVE mg/dL
Urobilinogen, UA: 0.2 mg/dL (ref 0.0–1.0)
pH: 5.5 (ref 5.0–8.0)

## 2017-12-16 LAB — POCT PREGNANCY, URINE: Preg Test, Ur: NEGATIVE

## 2017-12-16 LAB — GLUCOSE, CAPILLARY: Glucose-Capillary: 83 mg/dL (ref 65–99)

## 2017-12-16 MED ORDER — AZITHROMYCIN 250 MG PO TABS
1000.0000 mg | ORAL_TABLET | Freq: Once | ORAL | Status: AC
  Administered 2017-12-16: 1000 mg via ORAL

## 2017-12-16 MED ORDER — AZITHROMYCIN 250 MG PO TABS
ORAL_TABLET | ORAL | Status: AC
  Filled 2017-12-16: qty 4

## 2017-12-16 MED ORDER — CEFTRIAXONE SODIUM 250 MG IJ SOLR
250.0000 mg | Freq: Once | INTRAMUSCULAR | Status: AC
  Administered 2017-12-16: 250 mg via INTRAMUSCULAR

## 2017-12-16 MED ORDER — FLUCONAZOLE 200 MG PO TABS: ORAL_TABLET | ORAL | 0 refills | Status: DC

## 2017-12-16 MED ORDER — METRONIDAZOLE 500 MG PO TABS: 500.0000 mg | ORAL_TABLET | Freq: Two times a day (BID) | ORAL | 0 refills | Status: AC

## 2017-12-16 MED ORDER — CEFTRIAXONE SODIUM 250 MG IJ SOLR
INTRAMUSCULAR | Status: AC
  Filled 2017-12-16: qty 250

## 2017-12-16 NOTE — Discharge Instructions (Signed)
Withhold from intercourse for the next week. Complete course of medications provided for yeast and bacterial vaginosis.  Will notify you of any positive findings and if any changes to treatment are needed in regards to testing for STDs, bacteria, yeast, hiv and syphilis.  Please follow up with your primary care provider as you do have glucose in your urine, as this may need to be rechecked.

## 2017-12-16 NOTE — ED Provider Notes (Signed)
[place]    CSN: [id number] Arrival date & time: 12/16/17  1259     History   Chief Complaint Chief Complaint  Patient presents with  . Vaginal Discharge  . Exposure to STD    HPI Tanya Kirk is a 42 y.o. female.   Arlen presents with complaints of vaginal odor and white discharge. She feels the odor has been present for approximately 1 month but worsened after having intercourse with a new partner without a condom. She has some external vaginal itching, she has been using monistat products which helps some. She is concerned about STDs today due to new partner, is concerned about syphilis as well. Without abdominal pain, fevers, back pain, urinary symptoms. Denies history of stds. Has had bv and yeast infections in the past.    ROS per HPI.       Past Medical History:  Diagnosis Date  . Bulimia   . Depression     Patient Active Problem List   Diagnosis Date Noted  . 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 Cholecystitis 09/11/2016    Past Surgical History:  Procedure Laterality Date  . CHOLECYSTECTOMY N/A 09/12/2016   Procedure: LAPAROSCOPIC CHOLECYSTECTOMY;  Surgeon: Leighton Ruff, MD;  Location: [facility];  Service: General;  Laterality: N/A;    OB History    Gravida Para Term Preterm AB Living   1       1 0   SAB TAB Ectopic Multiple Live Births     1             Home Medications    Prior to Admission medications   Medication Sig Start Date End Date Taking? Authorizing Provider  fluconazole (DIFLUCAN) 200 MG tablet Take once today. Take second pill at completion of antibiotics. 12/16/17   Zigmund Gottron, NP  Ibuprofen-Diphenhydramine Cit (ADVIL PM PO) Take 1 tablet by mouth at bedtime as needed (pain).     [provider]  loratadine (CLARITIN) 10 MG tablet Take 10 mg by mouth every other day.     [provider]  metroNIDAZOLE (FLAGYL) 500 MG tablet Take 1 tablet (500 mg total) by mouth 2 (two) times daily for 7 days. 12/16/17  12/23/17  Zigmund Gottron, NP    Family History Family History  Problem Relation Age of Onset  . Cancer Other   . Diabetes Other     Social History Social History   Tobacco Use  . Smoking status: Current Some Day Smoker    Packs/day: 0.10    Types: Cigarettes  . Smokeless tobacco: Never Used  Substance Use Topics  . Alcohol use: Yes    Comment: socially  . Drug use: Yes    Types: Cocaine, Marijuana    Comment: twice per month, occasional MJ     Allergies   Tetracyclines & related and Other   Review of Systems Review of Systems   Physical Exam Triage Vital Signs ED Triage Vitals [12/16/17 1330]  Enc Vitals Group     BP 104/74     Pulse Rate 69     Resp 18     Temp 98.5 F (36.9 C)     Temp src      SpO2 97 %     Weight      Height      Head Circumference      Peak Flow      Pain Score      Pain Loc  Pain Edu?      Excl. in [place]?    No data found.  Updated Vital Signs BP 104/74   Pulse 69   Temp 98.5 F (36.9 C)   Resp 18   LMP 11/15/2017   SpO2 97%   Visual Acuity Right Eye Distance:   Left Eye Distance:   Bilateral Distance:    Right Eye Near:   Left Eye Near:    Bilateral Near:     Physical Exam  Constitutional: She is oriented to person, place, and time. She appears well-developed and well-nourished. No distress.  Cardiovascular: Normal rate, regular rhythm and normal heart sounds.  Pulmonary/Chest: Effort normal and breath sounds normal.  Abdominal: Soft. She exhibits no distension. There is no tenderness. There is no guarding.  Patient without pain, fevers, bleeding, gu exam deferred, urine cytology collected  Neurological: She is alert and oriented to person, place, and time.  Skin: Skin is warm and dry.     UC Treatments / Results  Labs (all labs ordered are listed, but only abnormal results are displayed) Labs Reviewed  POCT URINALYSIS DIP (DEVICE) - Abnormal; Notable for the following components:      Result Value    Glucose, UA 250 (*)    Ketones, ur TRACE (*)    Hgb urine dipstick MODERATE (*)    All other components within normal limits  GLUCOSE, CAPILLARY  RPR  HIV ANTIBODY (ROUTINE TESTING)  POCT PREGNANCY, URINE  URINE CYTOLOGY ANCILLARY ONLY    EKG  EKG Interpretation None       Radiology No results found.  Procedures Procedures (including critical care time)  Medications Ordered in UC Medications  azithromycin (ZITHROMAX) tablet 1,000 mg (not administered)  cefTRIAXone (ROCEPHIN) injection 250 mg (not administered)     Initial Impression / Assessment and Plan / UC Course  I have reviewed the triage vital signs and the nursing notes.  Pertinent labs & imaging results that were available during my care of the patient were reviewed by me and considered in my medical decision making (see chart for details).     Patient requests std treatment today due to concern with new partner and worsening of vaginal odor.zithromax and rocephin administered. Urine cytology pending, hiv and syphilis collected per patient request. Diflucan and flagyl provided. Glucose to urine, bs 83 today, without history of diabetes. Recommended recheck with PCP. Will notify of any positive findings and if any changes to treatment are needed.  Patient verbalized understanding and agreeable to plan.    Final Clinical Impressions(s) / UC Diagnoses   Final diagnoses:  Acute vaginitis  Concern about STD in female without diagnosis    ED Discharge Orders        Ordered    metroNIDAZOLE (FLAGYL) 500 MG tablet  2 times daily     12/16/17 1422    fluconazole (DIFLUCAN) 200 MG tablet     12/16/17 1422       Controlled Substance Prescriptions  Controlled Substance Registry consulted? Not Applicable   Zigmund Gottron, NP 12/16/17 1430

## 2017-12-16 NOTE — ED Triage Notes (Signed)
Pt here for vaginal odor and discharge. Worried about BV or STD.

## 2017-12-17 LAB — HIV ANTIBODY (ROUTINE TESTING W REFLEX): HIV SCREEN 4TH GENERATION: NONREACTIVE

## 2017-12-17 LAB — URINE CYTOLOGY ANCILLARY ONLY
Chlamydia: NEGATIVE
Neisseria Gonorrhea: NEGATIVE
TRICH (WINDOWPATH): NEGATIVE

## 2017-12-17 LAB — RPR: RPR Ser Ql: NONREACTIVE

## 2017-12-20 ENCOUNTER — Telehealth: Payer: Self-pay | Admitting: Family Medicine

## 2017-12-20 ENCOUNTER — Other Ambulatory Visit: Payer: Self-pay | Admitting: Family Medicine

## 2017-12-20 DIAGNOSIS — M722 Plantar fascial fibromatosis: Secondary | ICD-10-CM

## 2017-12-20 MED ORDER — IBUPROFEN 600 MG PO TABS: 600.0000 mg | ORAL_TABLET | Freq: Three times a day (TID) | ORAL | 0 refills | Status: DC | PRN

## 2017-12-20 MED FILL — IBUPROFEN 600 MG TABLET: 600 | 10 days supply | Qty: 30 | Fill #0

## 2017-12-20 NOTE — Telephone Encounter (Signed)
Pt. Called requesting for her PCP to write her an Rx for Ibuprofen Pt. States that her PCP has written an Rx for her before. Pt. Uses [facility]. Pt. Also stated that she has not been approved for disability b/c her PCP needs to notate that she has pain all the time. Please f/u

## 2017-12-20 NOTE — Telephone Encounter (Signed)
Script for ibuprofen sent to [facility] pharmacy. She can schedule an office appointment to address other concerns.

## 2017-12-21 LAB — URINE CYTOLOGY ANCILLARY ONLY
Bacterial vaginitis: NEGATIVE
Candida vaginitis: NEGATIVE

## 2018-01-23 ENCOUNTER — Telehealth: Payer: Self-pay | Admitting: Family Medicine

## 2018-01-23 NOTE — Telephone Encounter (Signed)
Patient called and requested for another form of pain medications, regarding menstral cycles. Patient stated she did not want to use all of the ibuprofen that she uses for her feet. Please fu.

## 2018-01-23 NOTE — Telephone Encounter (Signed)
Patient called asking about her blood type. Please fu at you earliest convenience.

## 2018-01-23 NOTE — Telephone Encounter (Signed)
She can schedule office visit with a provider to address her concerns.

## 2018-02-06 NOTE — Telephone Encounter (Signed)
Left message to return call at number listed. She has appointment on March 13th at 1030

## 2018-02-15 ENCOUNTER — Ambulatory Visit: Payer: Self-pay | Admitting: Nurse Practitioner

## 2018-03-02 ENCOUNTER — Emergency Department (HOSPITAL_COMMUNITY)
Admission: EM | Admit: 2018-03-02 | Discharge: 2018-03-03 | Disposition: A | Discharge status: Home / Self Care | Payer: Self-pay | Attending: Emergency Medicine | Admitting: Emergency Medicine

## 2018-03-02 ENCOUNTER — Other Ambulatory Visit: Payer: Self-pay

## 2018-03-02 ENCOUNTER — Encounter (HOSPITAL_COMMUNITY): Payer: Self-pay | Admitting: Emergency Medicine

## 2018-03-02 DIAGNOSIS — Z711 Person with feared health complaint in whom no diagnosis is made: Secondary | ICD-10-CM

## 2018-03-02 DIAGNOSIS — N898 Other specified noninflammatory disorders of vagina: Secondary | ICD-10-CM | POA: Insufficient documentation

## 2018-03-02 DIAGNOSIS — Z5321 Procedure and treatment not carried out due to patient leaving prior to being seen by health care provider: Secondary | ICD-10-CM | POA: Insufficient documentation

## 2018-03-02 LAB — URINALYSIS, ROUTINE W REFLEX MICROSCOPIC
BILIRUBIN URINE: NEGATIVE
Glucose, UA: NEGATIVE mg/dL
Ketones, ur: NEGATIVE mg/dL
LEUKOCYTES UA: NEGATIVE
Nitrite: NEGATIVE
PH: 6 (ref 5.0–8.0)
Protein, ur: NEGATIVE mg/dL
SPECIFIC GRAVITY, URINE: 1.025 (ref 1.005–1.030)

## 2018-03-02 MED ORDER — CEFTRIAXONE SODIUM 250 MG IJ SOLR
250.0000 mg | Freq: Once | INTRAMUSCULAR | Status: DC
Start: 2018-03-02 — End: 2018-03-03

## 2018-03-02 MED ORDER — AZITHROMYCIN 250 MG PO TABS: 1000.0000 mg | ORAL_TABLET | Freq: Once | ORAL | Status: DC

## 2018-03-02 NOTE — ED Notes (Addendum)
Pt stated that she was going to step outside and if she didn't come back in that she left and was going to call her PCP for results.

## 2018-03-02 NOTE — ED Notes (Signed)
Pelvic cart at the bedside 

## 2018-03-02 NOTE — ED Provider Notes (Cosign Needed)
[facility] EMERGENCY DEPARTMENT Provider Note   CSN: [id number] Arrival date & time: 03/02/18  1347     History   Chief Complaint Chief Complaint  Patient presents with  . Vaginal Discharge    HPI Tanya Kirk is a 42 y.o. female who presents to the ED with vaginal d/c. Patient states her sex partner has discharge and she thinks he is seeing someone else. G1 P0 SAB 1. Patient request STD screening. Current partner x 6 years. Hx of GC years ago. LMP one week ago and normal.  The history is provided by the patient. No language interpreter was used.  Vaginal Discharge   This is a new problem. The problem occurs constantly. The problem has been gradually worsening. The discharge was milky. She is not pregnant. Pertinent negatives include no fever, no diarrhea, no nausea, no vomiting, no dysuria, no frequency, no genital itching and no genital lesions. She has tried nothing for the symptoms. Her past medical history is significant for STD.    Past Medical History:  Diagnosis Date  . Bulimia   . Depression     Patient Active Problem List   Diagnosis Date Noted  . 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 Cholecystitis 09/11/2016    Past Surgical History:  Procedure Laterality Date  . CHOLECYSTECTOMY N/A 09/12/2016   Procedure: LAPAROSCOPIC CHOLECYSTECTOMY;  Surgeon: Leighton Ruff, MD;  Location: [facility];  Service: General;  Laterality: N/A;     OB History    Gravida  1   Para      Term      Preterm      AB  1   Living  0     SAB      TAB  1   Ectopic      Multiple      Live Births               Home Medications    Prior to Admission medications   Medication Sig Start Date End Date Taking? Authorizing Provider  fluconazole (DIFLUCAN) 200 MG tablet Take once today. Take second pill at completion of antibiotics. 12/16/17   Zigmund Gottron, NP  ibuprofen (ADVIL,MOTRIN) 600 MG tablet Take 1 tablet (600 mg total) by mouth every 8 (eight) hours  as needed. 12/20/17   Alfonse Spruce, FNP  Ibuprofen-Diphenhydramine Cit (ADVIL PM PO) Take 1 tablet by mouth at bedtime as needed (pain).     [provider]  loratadine (CLARITIN) 10 MG tablet Take 10 mg by mouth every other day.     [provider]    Family History Family History  Problem Relation Age of Onset  . Cancer Other   . Diabetes Other     Social History Social History   Tobacco Use  . Smoking status: Current Some Day Smoker    Packs/day: 0.10    Types: Cigarettes  . Smokeless tobacco: Never Used  Substance Use Topics  . Alcohol use: Yes    Comment: socially  . Drug use: Yes    Types: Cocaine, Marijuana    Comment: twice per month, occasional MJ     Allergies   Tetracyclines & related and Other   Review of Systems Review of Systems  Constitutional: Negative for chills and fever.  HENT: Negative.   Respiratory: Negative for cough and shortness of breath.   Cardiovascular: Negative for chest pain.  Gastrointestinal: Negative for diarrhea, nausea and vomiting.  Genitourinary: Positive for vaginal discharge. Negative  for dysuria, frequency, urgency and vaginal bleeding.  Skin: Negative for rash.  Neurological: Negative for syncope.  Psychiatric/Behavioral: Negative for confusion.     Physical Exam Updated Vital Signs BP 105/80 (BP Location: Right Arm)   Pulse 81   Temp 98.1 F (36.7 C) (Oral)   Resp 12   Ht 5' 4.5" (1.638 m)   Wt 81.6 kg (180 lb)   LMP 02/23/2018   SpO2 99%   BMI 30.42 kg/m   Physical Exam  Constitutional: She appears well-developed and well-nourished. No distress.  HENT:  Head: Normocephalic.  Eyes: EOM are normal.  Neck: Neck supple.  Cardiovascular: Normal rate.  Pulmonary/Chest: Effort normal.  Abdominal: Soft. There is no tenderness.  Musculoskeletal: Normal range of motion.  Neurological: She is alert.  Skin: Skin is warm and dry.  Psychiatric: She has a normal mood and affect. Her behavior  is normal.  Nursing note and vitals reviewed.  Went to room to do pelvic exam and RN reports patient left.   ED Treatments / Results  Labs (all labs ordered are listed, but only abnormal results are displayed) Labs Reviewed  URINALYSIS, ROUTINE W REFLEX MICROSCOPIC - Abnormal; Notable for the following components:      Result Value   Hgb urine dipstick MODERATE (*)    Bacteria, UA RARE (*)    Squamous Epithelial / LPF 0-5 (*)    All other components within normal limits  WET PREP, GENITAL  HIV ANTIBODY (ROUTINE TESTING)  RPR  POC URINE PREG, ED  GC/CHLAMYDIA PROBE AMP ([place]) NOT AT [facility]    EKG None  Radiology No results found.  Procedures Procedures (including critical care time)  Medications Ordered in ED Medications  cefTRIAXone (ROCEPHIN) injection 250 mg (has no administration in time range)  azithromycin (ZITHROMAX) tablet 1,000 mg (has no administration in time range)  patient left without getting medications.    Initial Impression / Assessment and Plan / ED Course  I have reviewed the triage vital signs and the nursing notes.   Final Clinical Impressions(s) / ED Diagnoses   Final diagnoses:  Concern about STD in female without diagnosis    ED Discharge Orders    None       Debroah Baller [place], [place] 03/02/18 1646

## 2018-03-02 NOTE — ED Triage Notes (Signed)
Pt reports vaginal discharge for about one week with foul odor. Pt wishes to be checked for STD's due to a new partner.

## 2018-03-02 NOTE — ED Notes (Signed)
Called for room x 3 with no answer 

## 2018-03-02 NOTE — ED Notes (Signed)
Called pt to recheck vitals no answer. Did not see pt outside of ED doors.

## 2018-03-03 LAB — RPR: RPR: NONREACTIVE

## 2018-03-03 LAB — HIV ANTIBODY (ROUTINE TESTING W REFLEX): HIV SCREEN 4TH GENERATION: NONREACTIVE

## 2018-05-22 ENCOUNTER — Encounter (HOSPITAL_COMMUNITY): Payer: Self-pay | Admitting: *Deleted

## 2018-05-22 ENCOUNTER — Other Ambulatory Visit: Payer: Self-pay

## 2018-05-22 ENCOUNTER — Emergency Department (HOSPITAL_COMMUNITY)
Admission: EM | Admit: 2018-05-22 | Discharge: 2018-05-22 | Disposition: A | Discharge status: Home / Self Care | Payer: Self-pay | Attending: Emergency Medicine | Admitting: Emergency Medicine

## 2018-05-22 DIAGNOSIS — K0889 Other specified disorders of teeth and supporting structures: Secondary | ICD-10-CM | POA: Insufficient documentation

## 2018-05-22 DIAGNOSIS — N898 Other specified noninflammatory disorders of vagina: Secondary | ICD-10-CM | POA: Insufficient documentation

## 2018-05-22 DIAGNOSIS — F17210 Nicotine dependence, cigarettes, uncomplicated: Secondary | ICD-10-CM | POA: Insufficient documentation

## 2018-05-22 DIAGNOSIS — Z79899 Other long term (current) drug therapy: Secondary | ICD-10-CM | POA: Insufficient documentation

## 2018-05-22 LAB — URINALYSIS, ROUTINE W REFLEX MICROSCOPIC
Bacteria, UA: NONE SEEN
Bilirubin Urine: NEGATIVE
Glucose, UA: NEGATIVE mg/dL
Ketones, ur: NEGATIVE mg/dL
Leukocytes, UA: NEGATIVE
Nitrite: NEGATIVE
Protein, ur: NEGATIVE mg/dL
Specific Gravity, Urine: 1.034 — ABNORMAL HIGH (ref 1.005–1.030)
pH: 5 (ref 5.0–8.0)

## 2018-05-22 LAB — WET PREP, GENITAL
Clue Cells Wet Prep HPF POC: NONE SEEN
Sperm: NONE SEEN
Trich, Wet Prep: NONE SEEN
Yeast Wet Prep HPF POC: NONE SEEN

## 2018-05-22 LAB — POC URINE PREG, ED: Preg Test, Ur: NEGATIVE

## 2018-05-22 MED ORDER — PENICILLIN V POTASSIUM 500 MG PO TABS: 500.0000 mg | ORAL_TABLET | Freq: Four times a day (QID) | ORAL | 0 refills | Status: AC

## 2018-05-22 MED ORDER — IBUPROFEN 600 MG PO TABS: 600.0000 mg | ORAL_TABLET | Freq: Four times a day (QID) | ORAL | 0 refills | Status: DC | PRN

## 2018-05-22 MED ORDER — ACETAMINOPHEN 500 MG PO TABS: 500.0000 mg | ORAL_TABLET | Freq: Four times a day (QID) | ORAL | 0 refills | Status: DC | PRN

## 2018-05-22 NOTE — ED Provider Notes (Signed)
[facility] EMERGENCY DEPARTMENT Provider Note   CSN: [id number] Arrival date & time: 05/22/18  1218     History   Chief Complaint Chief Complaint  Patient presents with  . Dental Pain    HPI Tanya Kirk is a 42 y.o. female with history of anemia and depression presents for evaluation of acute onset, waxing and waning pain for 2 weeks.  She states that she has had dental pain for several years and has multiple cracked teeth.  Pain is in the left maxillary and right mandibular regions, does not radiate.  Denies difficulty breathing or swallowing, fevers, chills, shortness of breath, or facial swelling.  She will try ibuprofen, Orajel with some relief.  Does not have a dentist.  Also complaining of vaginal discharge with malodorous discharge for 2 weeks.  She has this problem frequently and states that is usually a yeast infection or BV.  Denies abdominal pain, nausea, vomiting, abnormal vaginal bleeding or vaginal pain.  No urinary symptoms.  Has not tried anything for her symptoms.  She is currently sexually active with multiple female partners but states she always uses condoms.  The history is provided by the patient.    Past Medical History:  Diagnosis Date  . Bulimia   . Depression     Patient Active Problem List   Diagnosis Date Noted  . 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 Cholecystitis 09/11/2016    Past Surgical History:  Procedure Laterality Date  . CHOLECYSTECTOMY N/A 09/12/2016   Procedure: LAPAROSCOPIC CHOLECYSTECTOMY;  Surgeon: Leighton Ruff, MD;  Location: [facility];  Service: General;  Laterality: N/A;     OB History    Gravida  1   Para      Term      Preterm      AB  1   Living  0     SAB      TAB  1   Ectopic      Multiple      Live Births               Home Medications    Prior to Admission medications   Medication Sig Start Date End Date Taking? Authorizing Provider  acetaminophen (TYLENOL) 500 MG tablet Take 1  tablet (500 mg total) by mouth every 6 (six) hours as needed. 05/22/18   Axiel Fjeld A, PA-C  fluconazole (DIFLUCAN) 200 MG tablet Take once today. Take second pill at completion of antibiotics. 12/16/17   Zigmund Gottron, NP  ibuprofen (ADVIL,MOTRIN) 600 MG tablet Take 1 tablet (600 mg total) by mouth every 6 (six) hours as needed. 05/22/18   Climmie Buelow A, PA-C  Ibuprofen-Diphenhydramine Cit (ADVIL PM PO) Take 1 tablet by mouth at bedtime as needed (pain).     [provider]  loratadine (CLARITIN) 10 MG tablet Take 10 mg by mouth every other day.     [provider]  penicillin v potassium (VEETID) 500 MG tablet Take 1 tablet (500 mg total) by mouth 4 (four) times daily for 7 days. 05/22/18 05/29/18  Renita Papa, PA-C    Family History Family History  Problem Relation Age of Onset  . Cancer Other   . Diabetes Other     Social History Social History   Tobacco Use  . Smoking status: Current Some Day Smoker    Packs/day: 0.10    Types: Cigarettes  . Smokeless tobacco: Never Used  Substance Use Topics  . Alcohol use: Yes  Comment: socially  . Drug use: Yes    Types: Cocaine, Marijuana    Comment: twice per month, occasional MJ     Allergies   Tetracyclines & related and Other   Review of Systems Review of Systems  Constitutional: Negative for chills and fever.  HENT: Positive for dental problem. Negative for facial swelling and sore throat.   Gastrointestinal: Negative for abdominal pain, nausea and vomiting.  Genitourinary: Negative for dysuria, frequency, hematuria, menstrual problem, urgency, vaginal bleeding and vaginal discharge.  All other systems reviewed and are negative.    Physical Exam Updated Vital Signs BP 113/68 (BP Location: Right Arm)   Pulse 68   Temp 98.2 F (36.8 C) (Oral)   Resp 16   SpO2 100%   Physical Exam  Constitutional: She appears well-developed and well-nourished. No distress.  HENT:  Head: Normocephalic and  atraumatic.  Diffusely decaying dentition with cracked and missing teeth.  Cracked teeth with exposed dentin along the left maxillary region and right mandibular region, no tenderness to percussion. Dentition appears to be stable. No noted area of swelling or fluctuance. No trismus. Mouth opening to at least 3 finger widths. Handles oral secretions without difficulty. No noted facial swelling. No swelling or tenderness to the submental or submandibular regions. No swelling or tenderness into the soft tissues of the neck.    Eyes: Conjunctivae are normal. Right eye exhibits no discharge. Left eye exhibits no discharge.  Neck: Normal range of motion. Neck supple. No JVD present. No tracheal deviation present.  Cardiovascular: Normal rate.  Pulmonary/Chest: Effort normal.  Abdominal: Soft. Bowel sounds are normal. She exhibits no distension. There is no tenderness. There is no guarding.  Genitourinary:  Genitourinary Comments: Examination performed in the presence of a chaperone.  No masses or lesions to the external genitalia.  Moderate amount of thick white discharge in the vaginal vault.  Cervix is not erythematous or friable in appearance.  No cervical motion tenderness or adnexal tenderness.  Musculoskeletal: She exhibits no edema.  Lymphadenopathy:    She has no cervical adenopathy.  Neurological: She is alert.  Skin: Skin is warm and dry. No erythema.  Psychiatric: She has a normal mood and affect. Her behavior is normal.  Nursing note and vitals reviewed.    ED Treatments / Results  Labs (all labs ordered are listed, but only abnormal results are displayed) Labs Reviewed  WET PREP, GENITAL - Abnormal; Notable for the following components:      Result Value   WBC, Wet Prep HPF POC MODERATE (*)    All other components within normal limits  URINALYSIS, ROUTINE W REFLEX MICROSCOPIC - Abnormal; Notable for the following components:   Specific Gravity, Urine 1.034 (*)    Hgb  urine dipstick MODERATE (*)    All other components within normal limits  POC URINE PREG, ED  GC/CHLAMYDIA PROBE AMP ([place]) NOT AT [facility]    EKG None  Radiology No results found.  Procedures Procedures (including critical care time)  Medications Ordered in ED Medications - No data to display   Initial Impression / Assessment and Plan / ED Course  I have reviewed the triage vital signs and the nursing notes.  Pertinent labs & imaging results that were available during my care of the patient were reviewed by me and considered in my medical decision making (see chart for details).     Patient presents with complaint of dental pain and abnormal vaginal discharge for the past 2 weeks.  She is afebrile, vital signs are stable.  She is nontoxic in appearance.  No evidence of Ludwig's angina, tolerating secretions without difficulty.  I emphasized the importance of follow-up with a dentist and provided her with outpatient resources for follow-up.  Will discharge with course of penicillin and encourage the use of NSAIDs and Tylenol for pain control.  With regards to her vaginal discharge, she is seen frequently for the same complaint.  She has never tested positive for gonorrhea or chlamydia, tested positive for syphilis once several years ago.  She states that she always uses condoms when she has sexual intercourse.  Her abdomen is soft and nontender and she has no constitutional symptoms.  I doubt PID, TOA, ectopic pregnancy, or ovarian torsion.  UA is not concerning for UTI or nephrolithiasis.  Wet prep shows moderate WBCs but no other abnormalities.  No evidence of yeast vaginitis or BV at this time.  She elects to wait until her GC chlamydia results are back and does not want any prophylactic treatment for STDs.  She declined HIV or syphilis testing today.  Recommend follow-up with her OB/GYN for reevaluation of her symptoms.  Discussed strict ED return precautions. Pt verbalized  understanding of and agreement with plan and is safe for discharge home at this time.    Final Clinical Impressions(s) / ED Diagnoses   Final diagnoses:  Pain, dental  Vaginal discharge    ED Discharge Orders        Ordered    penicillin v potassium (VEETID) 500 MG tablet  4 times daily     05/22/18 1730    acetaminophen (TYLENOL) 500 MG tablet  Every 6 hours PRN     05/22/18 1730    ibuprofen (ADVIL,MOTRIN) 600 MG tablet  Every 6 hours PRN     05/22/18 1730       Renita Papa, PA-C 05/23/18 [place], Ankit, MD 05/24/18 1622

## 2018-05-22 NOTE — Discharge Instructions (Signed)
Please take all of your antibiotics until finished!   You may develop abdominal discomfort or diarrhea from the antibiotic.  You may help offset this with probiotics which you can buy or get in yogurt. Do not eat  or take the probiotics until 2 hours after your antibiotic.   Apply warm compresses to jaw throughout the day. Alternate 600 mg of ibuprofen and [phone] mg of Tylenol every 3 hours as needed for pain. Do not exceed 4000 mg of Tylenol daily.  You may also use warm water salt gargles, Orajel, or other over-the-counter dental pain remedies.Followup with a dentist is very important for ongoing evaluation and management of recurrent dental pain. Return to emergency department for emergent changing or worsening symptoms such as fever, worsening facial swelling, difficulty breathing or swallowing, throat tightness, or vision changes.

## 2018-05-22 NOTE — ED Triage Notes (Signed)
Pt in c/o dental pain for the last two weeks, states she had a filling break and pain just keeps getting worse

## 2018-05-22 NOTE — ED Notes (Signed)
Pt ambulated to room from waiting room without difficulty.

## 2018-05-23 LAB — GC/CHLAMYDIA PROBE AMP (~~LOC~~) NOT AT ARMC
Chlamydia: NEGATIVE
Neisseria Gonorrhea: NEGATIVE

## 2018-07-07 ENCOUNTER — Telehealth: Payer: Self-pay | Admitting: Family Medicine

## 2018-07-07 ENCOUNTER — Encounter: Payer: Self-pay | Admitting: *Deleted

## 2018-07-07 NOTE — Telephone Encounter (Signed)
Letter at front desk in file ready for pick up.  Left message with individual at given number to inform patient to return call.

## 2018-07-07 NOTE — Telephone Encounter (Signed)
Pt called to request a work note stating she was seen on 08/17/2017 for Plantar fasciitis. Please follow up

## 2018-12-03 ENCOUNTER — Emergency Department (HOSPITAL_COMMUNITY)
Admission: EM | Admit: 2018-12-03 | Discharge: 2018-12-03 | Disposition: A | Discharge status: Home / Self Care | Payer: Self-pay | Attending: Emergency Medicine | Admitting: Emergency Medicine

## 2018-12-03 ENCOUNTER — Other Ambulatory Visit: Payer: Self-pay

## 2018-12-03 DIAGNOSIS — L03012 Cellulitis of left finger: Secondary | ICD-10-CM | POA: Insufficient documentation

## 2018-12-03 DIAGNOSIS — F17210 Nicotine dependence, cigarettes, uncomplicated: Secondary | ICD-10-CM | POA: Insufficient documentation

## 2018-12-03 DIAGNOSIS — Z79899 Other long term (current) drug therapy: Secondary | ICD-10-CM | POA: Insufficient documentation

## 2018-12-03 MED ORDER — LIDOCAINE HCL 2 % IJ SOLN
10.0000 mL | Freq: Once | INTRAMUSCULAR | Status: AC
  Administered 2018-12-03: 200 mg
  Filled 2018-12-03: qty 20

## 2018-12-03 MED ORDER — IBUPROFEN 600 MG PO TABS: 600.0000 mg | ORAL_TABLET | Freq: Four times a day (QID) | ORAL | 0 refills | Status: AC | PRN

## 2018-12-03 NOTE — ED Provider Notes (Signed)
[facility] EMERGENCY DEPARTMENT Provider Note   CSN: [id number] Arrival date & time: 12/03/18  1427     History   Chief Complaint Chief Complaint  Patient presents with  . Hand Pain    HPI Tanya Kirk is a 42 y.o. female.  The history is provided by the patient.  Hand Pain  This is a new problem. Episode onset: 3-4 days ago. The problem occurs constantly. The problem has been gradually worsening. Associated symptoms comments: Pain, swelling and redness of the index finger after she gave herself a manicure. The symptoms are aggravated by bending (palpation). Nothing relieves the symptoms. Treatments tried: ibuprofen and warm soaks. The treatment provided moderate relief.    Past Medical History:  Diagnosis Date  . Bulimia   . Depression     Patient Active Problem List   Diagnosis Date Noted  . 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 Cholecystitis 09/11/2016    Past Surgical History:  Procedure Laterality Date  . CHOLECYSTECTOMY N/A 09/12/2016   Procedure: LAPAROSCOPIC CHOLECYSTECTOMY;  Surgeon: Leighton Ruff, MD;  Location: [facility];  Service: General;  Laterality: N/A;     OB History    Gravida  1   Para      Term      Preterm      AB  1   Living  0     SAB      TAB  1   Ectopic      Multiple      Live Births               Home Medications    Prior to Admission medications   Medication Sig Start Date End Date Taking? Authorizing Provider  acetaminophen (TYLENOL) 500 MG tablet Take 1 tablet (500 mg total) by mouth every 6 (six) hours as needed. 05/22/18   Fawze, Mina A, PA-C  fluconazole (DIFLUCAN) 200 MG tablet Take once today. Take second pill at completion of antibiotics. 12/16/17   Zigmund Gottron, NP  ibuprofen (ADVIL,MOTRIN) 600 MG tablet Take 1 tablet (600 mg total) by mouth every 6 (six) hours as needed. 05/22/18   Fawze, Mina A, PA-C  Ibuprofen-Diphenhydramine Cit (ADVIL PM PO) Take 1 tablet by mouth at bedtime as needed  (pain).     [provider]  loratadine (CLARITIN) 10 MG tablet Take 10 mg by mouth every other day.     [provider]    Family History Family History  Problem Relation Age of Onset  . Cancer Other   . Diabetes Other     Social History Social History   Tobacco Use  . Smoking status: Current Some Day Smoker    Packs/day: 0.10    Types: Cigarettes  . Smokeless tobacco: Never Used  Substance Use Topics  . Alcohol use: Yes    Comment: socially  . Drug use: Yes    Types: Cocaine, Marijuana    Comment: twice per month, occasional MJ     Allergies   Tetracyclines & related and Other   Review of Systems Review of Systems  All other systems reviewed and are negative.    Physical Exam Updated Vital Signs BP 122/66 (BP Location: Right Arm)   Pulse 79   Temp (!) 97.5 F (36.4 C) (Axillary)   Resp 16   Ht 5' 4.5" (1.638 m)   Wt 81.6 kg   LMP 11/05/2018 (Approximate)   SpO2 100%   BMI 30.42 kg/m   Physical Exam  Vitals signs and nursing note reviewed.  Constitutional:      General: She is not in acute distress.    Appearance: Normal appearance.  HENT:     Head: Normocephalic.  Cardiovascular:     Rate and Rhythm: Normal rate.  Pulmonary:     Effort: Pulmonary effort is normal.  Musculoskeletal:       Hands:  Skin:    General: Skin is warm.     Capillary Refill: Capillary refill takes less than 2 seconds.  Neurological:     General: No focal deficit present.     Mental Status: She is alert.      ED Treatments / Results  Labs (all labs ordered are listed, but only abnormal results are displayed) Labs Reviewed - No data to display  EKG None  Radiology No results found.  Procedures Procedures (including critical care time) INCISION AND DRAINAGE Performed by: Blanchie Dessert Consent: Verbal consent obtained. Risks and benefits: risks, benefits and alternatives were discussed Type: abscess  Body area: left index  finger  Anesthesia: digital block  Incision was made with a scalpel.  Local anesthetic: lidocaine 1% without epinephrine  Anesthetic total: 3 ml  Complexity: complex Blunt dissection to break up loculations  Drainage: purulent  Drainage amount: 21mL  Packing material: none  Patient tolerance: Patient tolerated the procedure well with no immediate complications.     Medications Ordered in ED Medications  lidocaine (XYLOCAINE) 2 % (with pres) injection 200 mg (has no administration in time range)     Initial Impression / Assessment and Plan / ED Course  I have reviewed the triage vital signs and the nursing notes.  Pertinent labs & imaging results that were available during my care of the patient were reviewed by me and considered in my medical decision making (see chart for details).     Patient presenting with evidence of paronychia of the left index finger after giving herself a manicure 3 to 4 days ago.  Does not appear to be a felon and no evidence of tenosynovitis.  I&D as above.  Final Clinical Impressions(s) / ED Diagnoses   Final diagnoses:  Paronychia of left little finger    ED Discharge Orders    None       Blanchie Dessert, MD 12/03/18 1535

## 2018-12-03 NOTE — ED Triage Notes (Signed)
Pt presents to ED for evaluation of swollen, red area on index finger of left hand. Sts it felt like there was a scratch for a few days and then it began to swell. Has taken ibuprofen with some relief. Pain is keeping her from sleeping.

## 2018-12-03 NOTE — ED Notes (Signed)
Pt stable, ambulatory, states understanding of discharge instructions 

## 2018-12-03 NOTE — Discharge Instructions (Signed)
Continue to soak with warm soaks.  Elevate if throbbing and take ibuprofen

## 2018-12-27 ENCOUNTER — Emergency Department (HOSPITAL_COMMUNITY)
Admission: EM | Admit: 2018-12-27 | Discharge: 2018-12-27 | Disposition: A | Discharge status: Home / Self Care | Payer: Self-pay | Attending: Emergency Medicine | Admitting: Emergency Medicine

## 2018-12-27 ENCOUNTER — Encounter (HOSPITAL_COMMUNITY): Payer: Self-pay | Admitting: Emergency Medicine

## 2018-12-27 DIAGNOSIS — Z79899 Other long term (current) drug therapy: Secondary | ICD-10-CM | POA: Insufficient documentation

## 2018-12-27 DIAGNOSIS — N898 Other specified noninflammatory disorders of vagina: Secondary | ICD-10-CM | POA: Insufficient documentation

## 2018-12-27 DIAGNOSIS — R300 Dysuria: Secondary | ICD-10-CM | POA: Insufficient documentation

## 2018-12-27 DIAGNOSIS — F17210 Nicotine dependence, cigarettes, uncomplicated: Secondary | ICD-10-CM | POA: Insufficient documentation

## 2018-12-27 LAB — URINALYSIS, ROUTINE W REFLEX MICROSCOPIC
Bacteria, UA: NONE SEEN
Bilirubin Urine: NEGATIVE
Glucose, UA: NEGATIVE mg/dL
Ketones, ur: NEGATIVE mg/dL
Leukocytes, UA: NEGATIVE
Nitrite: NEGATIVE
Protein, ur: NEGATIVE mg/dL
Specific Gravity, Urine: 1.024 (ref 1.005–1.030)
pH: 5 (ref 5.0–8.0)

## 2018-12-27 LAB — WET PREP, GENITAL
Clue Cells Wet Prep HPF POC: NONE SEEN
Sperm: NONE SEEN
Trich, Wet Prep: NONE SEEN
YEAST WET PREP: NONE SEEN

## 2018-12-27 LAB — I-STAT BETA HCG BLOOD, ED (MC, WL, AP ONLY): I-stat hCG, quantitative: <5 m[IU]/mL (ref <5)

## 2018-12-27 NOTE — ED Provider Notes (Signed)
[facility] EMERGENCY DEPARTMENT Provider Note   CSN: [id number] Arrival date & time: 12/27/18  1417     History   Chief Complaint Chief Complaint  Patient presents with  . Vaginal Itching    HPI Tanya Kirk is a 43 y.o. female with a hx of tobacco abuse, bulimia, and depression who presents to the ED with complaints of intermittent vaginal pruritus x 1 month. States she thinks she has a yeast infection. No specific alleviating/aggravating factors. No intervention tried at home. States she had 1 episode of dysuria. Denies vaginal discharge, frequency, urgency, fever, chills, or abdominal/pelvic pain. States that she is sexually active with multiple female partners & uses condoms. She is not concerned for STDs.   HPI  Past Medical History:  Diagnosis Date  . Bulimia   . Depression     Patient Active Problem List   Diagnosis Date Noted  . 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 Cholecystitis 09/11/2016    Past Surgical History:  Procedure Laterality Date  . CHOLECYSTECTOMY N/A 09/12/2016   Procedure: LAPAROSCOPIC CHOLECYSTECTOMY;  Surgeon: Leighton Ruff, MD;  Location: [facility];  Service: General;  Laterality: N/A;     OB History    Gravida  1   Para      Term      Preterm      AB  1   Living  0     SAB      TAB  1   Ectopic      Multiple      Live Births               Home Medications    Prior to Admission medications   Medication Sig Start Date End Date Taking? Authorizing Provider  acetaminophen (TYLENOL) 500 MG tablet Take 1 tablet (500 mg total) by mouth every 6 (six) hours as needed. 05/22/18   Fawze, Mina A, PA-C  fluconazole (DIFLUCAN) 200 MG tablet Take once today. Take second pill at completion of antibiotics. 12/16/17   Zigmund Gottron, NP  ibuprofen (ADVIL,MOTRIN) 600 MG tablet Take 1 tablet (600 mg total) by mouth every 6 (six) hours as needed. 12/03/18   Blanchie Dessert, MD  Ibuprofen-Diphenhydramine Cit (ADVIL PM PO)  Take 1 tablet by mouth at bedtime as needed (pain).     [provider]  loratadine (CLARITIN) 10 MG tablet Take 10 mg by mouth every other day.     [provider]    Family History Family History  Problem Relation Age of Onset  . Cancer Other   . Diabetes Other     Social History Social History   Tobacco Use  . Smoking status: Current Some Day Smoker    Packs/day: 0.10    Types: Cigarettes  . Smokeless tobacco: Never Used  Substance Use Topics  . Alcohol use: Yes    Comment: socially  . Drug use: Yes    Types: Cocaine, Marijuana    Comment: twice per month, occasional MJ     Allergies   Tetracyclines & related and Other   Review of Systems Review of Systems  Constitutional: Negative for chills and fever.  Gastrointestinal: Negative for abdominal pain.  Genitourinary: Positive for dysuria. Negative for frequency, hematuria, pelvic pain, vaginal bleeding and vaginal discharge.       Positive for vaginal pruritus  All other systems reviewed and are negative.    Physical Exam Updated Vital Signs There were no vitals taken for this visit.  Physical Exam Vitals signs and nursing note reviewed. Exam conducted with a chaperone present.  Constitutional:      General: She is not in acute distress.    Appearance: She is well-developed. She is not toxic-appearing.  HENT:     Head: Normocephalic and atraumatic.  Eyes:     General:        Right eye: No discharge.        Left eye: No discharge.     Conjunctiva/sclera: Conjunctivae normal.  Neck:     Musculoskeletal: Neck supple.  Cardiovascular:     Rate and Rhythm: Normal rate and regular rhythm.  Pulmonary:     Effort: Pulmonary effort is normal. No respiratory distress.     Breath sounds: Normal breath sounds. No wheezing, rhonchi or rales.  Abdominal:     General: There is no distension.     Palpations: Abdomen is soft.     Tenderness: There is no abdominal tenderness. There is no guarding  or rebound.  Genitourinary:    Exam position: Supine.     Pubic Area: No rash.      Labia:        Right: No lesion.        Left: No lesion.      Vagina: Vaginal discharge (mild amount, white, thick) present.     Cervix: No cervical motion tenderness or friability.     Adnexa:        Right: No mass, tenderness or fullness.         Left: No mass, tenderness or fullness.    Skin:    General: Skin is warm and dry.     Findings: No rash.  Neurological:     Mental Status: She is alert.     Comments: Clear speech.   Psychiatric:        Behavior: Behavior normal.      ED Treatments / Results  Labs (all labs ordered are listed, but only abnormal results are displayed) Labs Reviewed  WET PREP, GENITAL  RPR  HIV ANTIBODY (ROUTINE TESTING W REFLEX)  URINALYSIS, ROUTINE W REFLEX MICROSCOPIC  I-STAT BETA HCG BLOOD, ED ([facility], [facility], [facility] ONLY)  GC/CHLAMYDIA PROBE AMP ([facility]) NOT AT [facility]    EKG None  Radiology No results found.  Procedures Procedures (including critical care time)  Medications Ordered in ED Medications - No data to display   Initial Impression / Assessment and Plan / ED Course  I have reviewed the triage vital signs and the nursing notes.  Pertinent labs & imaging results that were available during my care of the patient were reviewed by me and considered in my medical decision making (see chart for details).   Patient presents with vaginal pruritis and concern for yeast infection. Nontoxic appearing, in no apparent distress, vitals WNL. Exam without abdominal tenderness or tenderness on bimanual to suggest PID. She does have some mild white discharge. No rashes/lesions. Testing for GC/chlamydia, RPP, & HIV pending- offered GC/chlamydia prophylaxis- patient declined. She is aware of pending results and need to seek tx & inform all sexual partners if positive. Wet prep with few WBCs, no trich/yeast/BV. UA with hematuria- PCP recheck- not consistent with UTI.  Pregnancy test is negative. Unclear definitive etiology to sxs. PCP/obgyn follow up. I discussed results, treatment plan, need for follow-up, and return precautions with the patient. Provided opportunity for questions, patient confirmed understanding and is in agreement with plan.     Final Clinical Impressions(s) / ED Diagnoses  Final diagnoses:  Vaginal itching    ED Discharge Orders    None       Leafy Kindle 12/27/18 1552    Sherwood Gambler, MD 12/27/18 Drema Halon

## 2018-12-27 NOTE — ED Triage Notes (Signed)
Pt complains of feeling like she has a yeast infection for about a month. One episode of painful urination. Denies any discharge. Pt also complains of itching.

## 2018-12-27 NOTE — Discharge Instructions (Addendum)
You were seen in the Er today for vaginal itching and concern for a yeast infection. Your work-up here was re-assuring. You do not seem to have a yeast infection or UTI. You did have some blood  in your urine- have this rechecked within 1 week. We tested you for gonorrhea, chlamydia, HIV, and syphilis, if positive we will call you within the next 3-4 days. If positive you will need to go to the health department to seek treatment and informal all sexual partners. Follow up with primary care or womens within 3 days. Return to the er for new or worsening symptoms or any other concerns.

## 2018-12-28 LAB — GC/CHLAMYDIA PROBE AMP (~~LOC~~) NOT AT ARMC
Chlamydia: NEGATIVE
Neisseria Gonorrhea: NEGATIVE

## 2018-12-28 LAB — RPR: RPR Ser Ql: NONREACTIVE

## 2018-12-28 LAB — HIV ANTIBODY (ROUTINE TESTING W REFLEX): HIV Screen 4th Generation wRfx: NONREACTIVE

## 2018-12-30 ENCOUNTER — Emergency Department (HOSPITAL_BASED_OUTPATIENT_CLINIC_OR_DEPARTMENT_OTHER)
Admission: EM | Admit: 2018-12-30 | Discharge: 2018-12-30 | Disposition: A | Discharge status: Home / Self Care | Payer: Self-pay | Attending: Emergency Medicine | Admitting: Emergency Medicine

## 2018-12-30 ENCOUNTER — Encounter (HOSPITAL_BASED_OUTPATIENT_CLINIC_OR_DEPARTMENT_OTHER): Payer: Self-pay | Admitting: *Deleted

## 2018-12-30 ENCOUNTER — Other Ambulatory Visit: Payer: Self-pay

## 2018-12-30 DIAGNOSIS — Z23 Encounter for immunization: Secondary | ICD-10-CM | POA: Insufficient documentation

## 2018-12-30 DIAGNOSIS — F1210 Cannabis abuse, uncomplicated: Secondary | ICD-10-CM | POA: Insufficient documentation

## 2018-12-30 DIAGNOSIS — F1410 Cocaine abuse, uncomplicated: Secondary | ICD-10-CM | POA: Insufficient documentation

## 2018-12-30 DIAGNOSIS — Z87891 Personal history of nicotine dependence: Secondary | ICD-10-CM | POA: Insufficient documentation

## 2018-12-30 DIAGNOSIS — L03115 Cellulitis of right lower limb: Secondary | ICD-10-CM | POA: Insufficient documentation

## 2018-12-30 DIAGNOSIS — L600 Ingrowing nail: Secondary | ICD-10-CM | POA: Insufficient documentation

## 2018-12-30 MED ORDER — TETANUS-DIPHTH-ACELL PERTUSSIS 5-2.5-18.5 LF-MCG/0.5 IM SUSP
0.5000 mL | Freq: Once | INTRAMUSCULAR | Status: AC
  Administered 2018-12-30: 0.5 mL via INTRAMUSCULAR
  Filled 2018-12-30: qty 0.5

## 2018-12-30 MED ORDER — IBUPROFEN 400 MG PO TABS
600.0000 mg | ORAL_TABLET | Freq: Once | ORAL | Status: AC
  Administered 2018-12-30: 600 mg via ORAL
  Filled 2018-12-30: qty 1

## 2018-12-30 MED ORDER — CLINDAMYCIN HCL 150 MG PO CAPS
450.0000 mg | ORAL_CAPSULE | Freq: Once | ORAL | Status: AC
  Administered 2018-12-30: 450 mg via ORAL
  Filled 2018-12-30: qty 3

## 2018-12-30 MED ORDER — CLINDAMYCIN HCL 150 MG PO CAPS: 450.0000 mg | ORAL_CAPSULE | Freq: Three times a day (TID) | ORAL | 0 refills | Status: AC

## 2018-12-30 NOTE — ED Triage Notes (Signed)
Pt has red swollen area to right calf. Pt ?spider bite. She attempted to pop it a few days ago. She also c/o ingrown toe right big toe

## 2018-12-30 NOTE — Discharge Instructions (Signed)
If you develop fever, severe pain in your leg, worsening redness, or any other new/concerning symptoms then return to the ER for evaluation.  Otherwise take the antibiotics until completed.  Use warm compresses and ibuprofen.

## 2018-12-30 NOTE — ED Provider Notes (Signed)
[facility] EMERGENCY DEPARTMENT Provider Note   CSN: [id number] Arrival date & time: 12/30/18  2020     History   Chief Complaint Chief Complaint  Patient presents with  . Wound Check    HPI Tanya Kirk is a 43 y.o. female.  HPI  43 year old female presents with 2 complaints.  Chief complaint is an area she is concerned as a spider bite on her right calf.  Noticed it about 2 days ago.  Somewhat painful and is red.  She did not notice a bite but was recently working in the garage.  No fevers or systemic symptoms.  One time something drained out of that area but otherwise no drainage.  She also complains of a chronic ingrown toenail in her right great toe and would like for this to be removed.  No redness or swelling.  Past Medical History:  Diagnosis Date  . Bulimia   . Depression     Patient Active Problem List   Diagnosis Date Noted  . 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 Cholecystitis 09/11/2016    Past Surgical History:  Procedure Laterality Date  . CHOLECYSTECTOMY N/A 09/12/2016   Procedure: LAPAROSCOPIC CHOLECYSTECTOMY;  Surgeon: Leighton Ruff, MD;  Location: [facility];  Service: General;  Laterality: N/A;     OB History    Gravida  1   Para      Term      Preterm      AB  1   Living  0     SAB      TAB  1   Ectopic      Multiple      Live Births               Home Medications    Prior to Admission medications   Medication Sig Start Date End Date Taking? Authorizing Provider  ibuprofen (ADVIL,MOTRIN) 600 MG tablet Take 1 tablet (600 mg total) by mouth every 6 (six) hours as needed. 12/03/18  Yes Blanchie Dessert, MD  acetaminophen (TYLENOL) 500 MG tablet Take 1 tablet (500 mg total) by mouth every 6 (six) hours as needed. 05/22/18   Fawze, Mina A, PA-C  clindamycin (CLEOCIN) 150 MG capsule Take 3 capsules (450 mg total) by mouth 3 (three) times daily for 7 days. 12/30/18 01/06/19  Sherwood Gambler, MD  fluconazole (DIFLUCAN) 200 MG  tablet Take once today. Take second pill at completion of antibiotics. 12/16/17   Zigmund Gottron, NP  Ibuprofen-Diphenhydramine Cit (ADVIL PM PO) Take 1 tablet by mouth at bedtime as needed (pain).     [provider]  loratadine (CLARITIN) 10 MG tablet Take 10 mg by mouth every other day.     [provider]    Family History Family History  Problem Relation Age of Onset  . Cancer Other   . Diabetes Other     Social History Social History   Tobacco Use  . Smoking status: Former Smoker    Packs/day: 0.10    Types: Cigarettes  . Smokeless tobacco: Never Used  Substance Use Topics  . Alcohol use: Yes    Comment: socially  . Drug use: Yes    Types: Cocaine, Marijuana    Comment: twice per month, occasional MJ     Allergies   Tetracyclines & related and Other   Review of Systems Review of Systems  Constitutional: Negative for fever.  Skin: Positive for color change.     Physical Exam Updated Vital Signs  BP 113/78 (BP Location: Left Arm)   Pulse 80   Temp 98.3 F (36.8 C) (Oral)   Resp 18   LMP 12/12/2018   SpO2 100%   Physical Exam Vitals signs and nursing note reviewed.  Constitutional:      General: She is not in acute distress.    Appearance: She is well-developed. She is not ill-appearing.  HENT:     Head: Normocephalic and atraumatic.     Right Ear: External ear normal.     Left Ear: External ear normal.     Nose: Nose normal.  Eyes:     General:        Right eye: No discharge.        Left eye: No discharge.  Pulmonary:     Effort: Pulmonary effort is normal.  Musculoskeletal:       Legs:       Feet:  Skin:    General: Skin is warm and dry.     Findings: Erythema present.  Neurological:     Mental Status: She is alert.  Psychiatric:        Mood and Affect: Mood is not anxious.      ED Treatments / Results  Labs (all labs ordered are listed, but only abnormal results are displayed) Labs Reviewed - No data to  display  EKG None  Radiology No results found.  Procedures Ultrasound ED Soft Tissue Date/Time: 12/30/2018 10:14 PM Performed by: Sherwood Gambler, MD Authorized by: Sherwood Gambler, MD   Procedure details:    Indications: evaluate for cellulitis     Transverse view:  Visualized   Longitudinal view:  Visualized   Images: archived   Location:    Location: lower extremity     Side:  Right Findings:     cellulitis present Comments:     Vague, non-formed fluid seen and otherwise inflammatory cellulitis.  No defined fluid collection/abscess.   (including critical care time)  Medications Ordered in ED Medications  ibuprofen (ADVIL,MOTRIN) tablet 600 mg (has no administration in time range)  clindamycin (CLEOCIN) capsule 450 mg (has no administration in time range)     Initial Impression / Assessment and Plan / ED Course  I have reviewed the triage vital signs and the nursing notes.  Pertinent labs & imaging results that were available during my care of the patient were reviewed by me and considered in my medical decision making (see chart for details).     Patient's leg appears to have cellulitis.  There might be a little bit of fluid on the ultrasound but no formed fluid collection such as abscess.  I do not think official incision/drainage would be beneficial.  As for the ingrown toenail refer to podiatry as this is a chronic process with no acute complications.  She is asking for a tetanus shot and while she does not have any acute open wound, this would be good health maintenance so I will give it to her.  Otherwise, antibiotics, local wound care and return precautions.  Final Clinical Impressions(s) / ED Diagnoses   Final diagnoses:  Cellulitis of right leg  Ingrown toenail of right foot    ED Discharge Orders         Ordered    clindamycin (CLEOCIN) 150 MG capsule  3 times daily     12/30/18 2205           Sherwood Gambler, MD 12/30/18 2215

## 2019-04-14 ENCOUNTER — Encounter (HOSPITAL_COMMUNITY): Payer: Self-pay | Admitting: *Deleted

## 2019-04-14 ENCOUNTER — Other Ambulatory Visit: Payer: Self-pay

## 2019-04-14 ENCOUNTER — Emergency Department (HOSPITAL_COMMUNITY)
Admission: EM | Admit: 2019-04-14 | Discharge: 2019-04-14 | Disposition: A | Discharge status: Home / Self Care | Payer: Self-pay | Attending: Emergency Medicine | Admitting: Emergency Medicine

## 2019-04-14 DIAGNOSIS — F1290 Cannabis use, unspecified, uncomplicated: Secondary | ICD-10-CM | POA: Insufficient documentation

## 2019-04-14 DIAGNOSIS — J020 Streptococcal pharyngitis: Secondary | ICD-10-CM

## 2019-04-14 DIAGNOSIS — Z87891 Personal history of nicotine dependence: Secondary | ICD-10-CM | POA: Insufficient documentation

## 2019-04-14 DIAGNOSIS — F1490 Cocaine use, unspecified, uncomplicated: Secondary | ICD-10-CM | POA: Insufficient documentation

## 2019-04-14 LAB — GROUP A STREP BY PCR: Group A Strep by PCR: DETECTED — AB

## 2019-04-14 MED ORDER — PENICILLIN G BENZATHINE 1200000 UNIT/2ML IM SUSP
1.2000 10*6.[IU] | Freq: Once | INTRAMUSCULAR | Status: AC
  Administered 2019-04-14: 1.2 10*6.[IU] via INTRAMUSCULAR
  Filled 2019-04-14: qty 2

## 2019-04-14 NOTE — ED Notes (Signed)
Patient verbalizes understanding of discharge instructions. Opportunity for questioning and answers were provided. Armband removed by staff, pt discharged from ED.  

## 2019-04-14 NOTE — Discharge Instructions (Addendum)
Today you were treated for strep pharyngitis with a penicillin VK injection.  Symptoms should improve in the next 24 to 48 hours.  Please follow-up with your primary care physician as needed.

## 2019-04-14 NOTE — ED Triage Notes (Signed)
Sore throat and tonsillar swelling since yesterday. Denies fevers.

## 2019-04-14 NOTE — ED Provider Notes (Signed)
[facility] EMERGENCY DEPARTMENT Provider Note   CSN: [id number] Arrival date & time: 04/14/19  1953    History   Chief Complaint Chief Complaint  Patient presents with  . Sore Throat    HPI Tanya Kirk is a 43 y.o. female.     43 y.o female with a PMH of depression, Bulimia sent to the ED with a chief complaint of sore throat x3 weeks.  Patient reports she felt her tonsils swell about 3 weeks ago, was recommended by a friend to eat liquid foods, avoid eating anything spicy to help.  She reports some improvement in symptoms, however 4 days ago the swelling in her tonsils worsen, she reports pain with swallowing, along with pain along her neck.  Patient ports finding a amoxicillin pill this morning which she took half of as she had this leftover from a previous visit.  She has been taking ibuprofen with some improvement in symptoms.  She denies any fever, difficulty swallowing, shortness of breath or chest pain.     Past Medical History:  Diagnosis Date  . Bulimia   . Depression     Patient Active Problem List   Diagnosis Date Noted  . 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 Cholecystitis 09/11/2016    Past Surgical History:  Procedure Laterality Date  . CHOLECYSTECTOMY N/A 09/12/2016   Procedure: LAPAROSCOPIC CHOLECYSTECTOMY;  Surgeon: Leighton Ruff, MD;  Location: [facility];  Service: General;  Laterality: N/A;     OB History    Gravida  1   Para      Term      Preterm      AB  1   Living  0     SAB      TAB  1   Ectopic      Multiple      Live Births               Home Medications    Prior to Admission medications   Medication Sig Start Date End Date Taking? Authorizing Provider  acetaminophen (TYLENOL) 500 MG tablet Take 1 tablet (500 mg total) by mouth every 6 (six) hours as needed. 05/22/18   Fawze, Mina A, PA-C  fluconazole (DIFLUCAN) 200 MG tablet Take once today. Take second pill at completion of antibiotics. 12/16/17    Zigmund Gottron, NP  ibuprofen (ADVIL,MOTRIN) 600 MG tablet Take 1 tablet (600 mg total) by mouth every 6 (six) hours as needed. 12/03/18   Blanchie Dessert, MD  Ibuprofen-Diphenhydramine Cit (ADVIL PM PO) Take 1 tablet by mouth at bedtime as needed (pain).     [provider]  loratadine (CLARITIN) 10 MG tablet Take 10 mg by mouth every other day.     [provider]    Family History Family History  Problem Relation Age of Onset  . Cancer Other   . Diabetes Other     Social History Social History   Tobacco Use  . Smoking status: Former Smoker    Packs/day: 0.10    Types: Cigarettes  . Smokeless tobacco: Never Used  Substance Use Topics  . Alcohol use: Yes    Comment: socially  . Drug use: Yes    Types: Cocaine, Marijuana    Comment: twice per month, occasional MJ     Allergies   Tetracyclines & related and Other   Review of Systems Review of Systems  Constitutional: Negative for fever.  HENT: Positive for sore throat.   Respiratory: Negative  for cough and shortness of breath.   Cardiovascular: Negative for chest pain.     Physical Exam Updated Vital Signs BP 114/70 (BP Location: Right Arm)   Pulse 93   Temp 98.4 F (36.9 C) (Oral)   Resp 17   LMP 04/03/2019   SpO2 99%   Physical Exam Vitals signs and nursing note reviewed.  Constitutional:      General: She is not in acute distress.    Appearance: She is well-developed.  HENT:     Head: Normocephalic and atraumatic.     Mouth/Throat:     Pharynx: Oropharynx is clear. Posterior oropharyngeal erythema present. No oropharyngeal exudate.     Tonsils: Tonsillar exudate present. No tonsillar abscesses. 2+ on the right. 2+ on the left.     Comments: Oropharynx appears erythematous, bilateral tonsillar exudates 2+. Eyes:     Pupils: Pupils are equal, round, and reactive to light.  Neck:     Musculoskeletal: Normal range of motion.  Cardiovascular:     Rate and Rhythm: Regular rhythm.      Heart sounds: Normal heart sounds.  Pulmonary:     Effort: Pulmonary effort is normal. No respiratory distress.     Breath sounds: Normal breath sounds.  Abdominal:     General: Bowel sounds are normal. There is no distension.     Palpations: Abdomen is soft.     Tenderness: There is no abdominal tenderness.  Musculoskeletal:        General: No tenderness or deformity.     Right lower leg: No edema.     Left lower leg: No edema.  Skin:    General: Skin is warm and dry.  Neurological:     Mental Status: She is alert and oriented to person, place, and time.      ED Treatments / Results  Labs (all labs ordered are listed, but only abnormal results are displayed) Labs Reviewed  GROUP A STREP BY PCR - Abnormal; Notable for the following components:      Result Value   Group A Strep by PCR DETECTED (*)    All other components within normal limits    EKG None  Radiology No results found.  Procedures Procedures (including critical care time)  Medications Ordered in ED Medications  penicillin g benzathine (BICILLIN LA) 1200000 UNIT/2ML injection 1.2 Million Units (has no administration in time range)     Initial Impression / Assessment and Plan / ED Course  I have reviewed the triage vital signs and the nursing notes.  Pertinent labs & imaging results that were available during my care of the patient were reviewed by me and considered in my medical decision making (see chart for details).  Patient with no past medical history presents to the ED with sore throat x3 weeks, reports trying changes in food without improvement in symptoms.  Has taken some ibuprofen along with an amoxicillin with no improvement.  Patient denies any fever, shortness of breath, chest pain or cough.  PCR strep obtained which tested positive for strep.  Shared decision making conversation with patient of treating with penicillin VK injection versus outpatient therapy, patient reports due to her  having this pain for the past 3 weeks were rather choose injection at this time.  Penicillin VK will be order to treat patient.  With stable vital signs, will monitor for 30 minutes after injection has been given.    Final Clinical Impressions(s) / ED Diagnoses   Final diagnoses:  Strep pharyngitis  ED Discharge Orders    None       [name] Fitting, [place] 04/18/19 3151    Charlesetta Shanks, MD 04/18/19 1521

## 2019-05-16 ENCOUNTER — Ambulatory Visit (HOSPITAL_COMMUNITY)
Admission: EM | Admit: 2019-05-16 | Discharge: 2019-05-16 | Disposition: A | Discharge status: Home / Self Care | Payer: Self-pay | Attending: Family Medicine | Admitting: Family Medicine

## 2019-05-16 ENCOUNTER — Encounter (HOSPITAL_COMMUNITY): Payer: Self-pay | Admitting: Emergency Medicine

## 2019-05-16 ENCOUNTER — Other Ambulatory Visit: Payer: Self-pay

## 2019-05-16 DIAGNOSIS — N760 Acute vaginitis: Secondary | ICD-10-CM | POA: Insufficient documentation

## 2019-05-16 LAB — POCT URINALYSIS DIP (DEVICE)
Bilirubin Urine: NEGATIVE
Glucose, UA: NEGATIVE mg/dL
Ketones, ur: NEGATIVE mg/dL
Leukocytes,Ua: NEGATIVE
Nitrite: NEGATIVE
Protein, ur: NEGATIVE mg/dL
Specific Gravity, Urine: >=1.03 (ref 1.005–1.030)
Urobilinogen, UA: 0.2 mg/dL (ref 0.0–1.0)
pH: 5.5 (ref 5.0–8.0)

## 2019-05-16 LAB — POCT PREGNANCY, URINE: Preg Test, Ur: NEGATIVE

## 2019-05-16 MED ORDER — FLUCONAZOLE 150 MG PO TABS: 150.0000 mg | ORAL_TABLET | Freq: Every day | ORAL | 0 refills | Status: DC

## 2019-05-16 NOTE — ED Triage Notes (Signed)
Pt presents to [facility] for assessment of vaginal discharge, fatigue, and burning with urination.

## 2019-05-16 NOTE — ED Provider Notes (Signed)
[facility]    CSN: [id number] Arrival date & time: 05/16/19  1348     History   Chief Complaint Chief Complaint  Patient presents with  . Dysuria  . Vaginal Discharge    HPI Tanya Kirk is a 43 y.o. female.   Patient is a 43 year old female the presents today with vaginal discharge, itching and irritation.  Symptoms have been constant and worsening over the past week.  She has been using over-the-counter creams and vaginal lubricant without any relief.  Reporting this made her symptoms worse. She is currently sexually active with multiple partners.  Reports that she uses condoms.  Reporting previous partner that was IV drug user.  She has not had STD testing since before this. Patient's last menstrual period was 05/09/2019.  No associated domino pain, pelvic pain, back pain, fevers.  Mild dysuria without hematuria or urinary frequency.  ROS per HPI       Past Medical History:  Diagnosis Date  . Bulimia   . Depression     Patient Active Problem List   Diagnosis Date Noted  . 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 Cholecystitis 09/11/2016    Past Surgical History:  Procedure Laterality Date  . CHOLECYSTECTOMY N/A 09/12/2016   Procedure: LAPAROSCOPIC CHOLECYSTECTOMY;  Surgeon: Leighton Ruff, MD;  Location: [facility];  Service: General;  Laterality: N/A;    OB History    Gravida  1   Para      Term      Preterm      AB  1   Living  0     SAB      TAB  1   Ectopic      Multiple      Live Births               Home Medications    Prior to Admission medications   Medication Sig Start Date End Date Taking? Authorizing Provider  acetaminophen (TYLENOL) 500 MG tablet Take 1 tablet (500 mg total) by mouth every 6 (six) hours as needed. 05/22/18   Fawze, Mina A, PA-C  fluconazole (DIFLUCAN) 150 MG tablet Take 1 tablet (150 mg total) by mouth daily. 05/16/19   Loura Halt A, NP  ibuprofen (ADVIL,MOTRIN) 600 MG tablet Take 1 tablet (600 mg total)  by mouth every 6 (six) hours as needed. 12/03/18   Blanchie Dessert, MD  Ibuprofen-Diphenhydramine Cit (ADVIL PM PO) Take 1 tablet by mouth at bedtime as needed (pain).     [provider]  loratadine (CLARITIN) 10 MG tablet Take 10 mg by mouth every other day.     [provider]    Family History Family History  Problem Relation Age of Onset  . Cancer Other   . Diabetes Other     Social History Social History   Tobacco Use  . Smoking status: Former Smoker    Packs/day: 0.10    Types: Cigarettes  . Smokeless tobacco: Never Used  Substance Use Topics  . Alcohol use: Yes    Comment: socially  . Drug use: Yes    Types: Cocaine, Marijuana    Comment: twice per month, occasional MJ     Allergies   Tetracyclines & related and Other   Review of Systems Review of Systems   Physical Exam Triage Vital Signs ED Triage Vitals  Enc Vitals Group     BP 05/16/19 1428 112/74     Pulse Rate 05/16/19 1428 73  Resp 05/16/19 1428 18     Temp 05/16/19 1428 (!) 97.5 F (36.4 C)     Temp Source 05/16/19 1428 Temporal     SpO2 05/16/19 1428 100 %     Weight --      Height --      Head Circumference --      Peak Flow --      Pain Score 05/16/19 1427 0     Pain Loc --      Pain Edu? --      Excl. in [place]? --    No data found.  Updated Vital Signs BP 112/74 (BP Location: Right Arm)   Pulse 73   Temp (!) 97.5 F (36.4 C) (Temporal)   Resp 18   LMP 05/09/2019   SpO2 100%   Visual Acuity Right Eye Distance:   Left Eye Distance:   Bilateral Distance:    Right Eye Near:   Left Eye Near:    Bilateral Near:     Physical Exam Vitals signs and nursing note reviewed.  Constitutional:      General: She is not in acute distress.    Appearance: Normal appearance. She is not ill-appearing, toxic-appearing or diaphoretic.  HENT:     Head: Normocephalic and atraumatic.  Eyes:     Conjunctiva/sclera: Conjunctivae normal.  Pulmonary:     Effort:  Pulmonary effort is normal.  Genitourinary:    Comments: Deferred  Musculoskeletal: Normal range of motion.  Skin:    General: Skin is warm and dry.  Neurological:     Mental Status: She is alert.  Psychiatric:        Mood and Affect: Mood normal.      UC Treatments / Results  Labs (all labs ordered are listed, but only abnormal results are displayed) Labs Reviewed  POCT URINALYSIS DIP (DEVICE) - Abnormal; Notable for the following components:      Result Value   Hgb urine dipstick MODERATE (*)    All other components within normal limits  HIV ANTIBODY (ROUTINE TESTING W REFLEX)  RPR  POC URINE PREG, ED  POCT PREGNANCY, URINE  CERVICOVAGINAL ANCILLARY ONLY    EKG None  Radiology No results found.  Procedures Procedures (including critical care time)  Medications Ordered in UC Medications - No data to display  Initial Impression / Assessment and Plan / UC Course  I have reviewed the triage vital signs and the nursing notes.  Pertinent labs & imaging results that were available during my care of the patient were reviewed by me and considered in my medical decision making (see chart for details).     Urine negative for infection Did show moderate hemoglobin.  Patient states this is chronic for her. Patient symptoms consistent with vaginitis or yeast infection. We will go ahead and treat with Diflucan Sending swab for further testing to include STDs and drawing blood for HIV and RPR Labs pending.  Final Clinical Impressions(s) / UC Diagnoses   Final diagnoses:  Vaginitis and vulvovaginitis     Discharge Instructions     Take the medication as prescribed. We are sending your swab for testing and blood work. We will call with any positive results.  Follow up as needed for continued or worsening symptoms     ED Prescriptions    Medication Sig Dispense Auth. Provider   fluconazole (DIFLUCAN) 150 MG tablet Take 1 tablet (150 mg total) by mouth daily. 2  tablet Orvan July, NP  Controlled Substance Prescriptions [place] Controlled Substance Registry consulted? Not Applicable   Orvan July, NP 05/16/19 1550

## 2019-05-16 NOTE — Discharge Instructions (Addendum)
Take the medication as prescribed. We are sending your swab for testing and blood work. We will call with any positive results.  Follow up as needed for continued or worsening symptoms

## 2019-05-17 LAB — CERVICOVAGINAL ANCILLARY ONLY
Bacterial vaginitis: NEGATIVE
Candida vaginitis: POSITIVE — AB
Chlamydia: NEGATIVE
Neisseria Gonorrhea: NEGATIVE
Trichomonas: NEGATIVE

## 2019-05-17 LAB — HIV ANTIBODY (ROUTINE TESTING W REFLEX): HIV Screen 4th Generation wRfx: NONREACTIVE

## 2019-05-17 LAB — RPR: RPR Ser Ql: NONREACTIVE

## 2019-07-18 ENCOUNTER — Emergency Department (HOSPITAL_COMMUNITY): Payer: Self-pay

## 2019-07-18 ENCOUNTER — Emergency Department (HOSPITAL_COMMUNITY)
Admission: EM | Admit: 2019-07-18 | Discharge: 2019-07-18 | Disposition: A | Discharge status: Home / Self Care | Payer: Self-pay | Attending: Emergency Medicine | Admitting: Emergency Medicine

## 2019-07-18 DIAGNOSIS — S63692A Other sprain of right middle finger, initial encounter: Secondary | ICD-10-CM | POA: Insufficient documentation

## 2019-07-18 DIAGNOSIS — Z202 Contact with and (suspected) exposure to infections with a predominantly sexual mode of transmission: Secondary | ICD-10-CM | POA: Insufficient documentation

## 2019-07-18 DIAGNOSIS — Y929 Unspecified place or not applicable: Secondary | ICD-10-CM | POA: Insufficient documentation

## 2019-07-18 DIAGNOSIS — Z87891 Personal history of nicotine dependence: Secondary | ICD-10-CM | POA: Insufficient documentation

## 2019-07-18 DIAGNOSIS — Z79899 Other long term (current) drug therapy: Secondary | ICD-10-CM | POA: Insufficient documentation

## 2019-07-18 DIAGNOSIS — R300 Dysuria: Secondary | ICD-10-CM | POA: Insufficient documentation

## 2019-07-18 DIAGNOSIS — Y999 Unspecified external cause status: Secondary | ICD-10-CM | POA: Insufficient documentation

## 2019-07-18 DIAGNOSIS — X58XXXA Exposure to other specified factors, initial encounter: Secondary | ICD-10-CM | POA: Insufficient documentation

## 2019-07-18 DIAGNOSIS — Y939 Activity, unspecified: Secondary | ICD-10-CM | POA: Insufficient documentation

## 2019-07-18 LAB — WET PREP, GENITAL
Clue Cells Wet Prep HPF POC: NONE SEEN
Sperm: NONE SEEN
Trich, Wet Prep: NONE SEEN
Yeast Wet Prep HPF POC: NONE SEEN

## 2019-07-18 LAB — URINALYSIS, ROUTINE W REFLEX MICROSCOPIC
Bilirubin Urine: NEGATIVE
Glucose, UA: NEGATIVE mg/dL
Ketones, ur: NEGATIVE mg/dL
Nitrite: NEGATIVE
Protein, ur: NEGATIVE mg/dL
Specific Gravity, Urine: 1.027 (ref 1.005–1.030)
pH: 5 (ref 5.0–8.0)

## 2019-07-18 LAB — I-STAT BETA HCG BLOOD, ED (MC, WL, AP ONLY): I-stat hCG, quantitative: <5 m[IU]/mL (ref <5)

## 2019-07-18 MED ORDER — AZITHROMYCIN 250 MG PO TABS
1000.0000 mg | ORAL_TABLET | Freq: Once | ORAL | Status: AC
  Administered 2019-07-18: 1000 mg via ORAL
  Filled 2019-07-18: qty 4

## 2019-07-18 MED ORDER — LIDOCAINE HCL (PF) 1 % IJ SOLN
INTRAMUSCULAR | Status: AC
  Administered 2019-07-18: 20:00:00 0.9 mL
  Filled 2019-07-18: qty 5

## 2019-07-18 MED ORDER — CEFTRIAXONE SODIUM 250 MG IJ SOLR
250.0000 mg | Freq: Once | INTRAMUSCULAR | Status: AC
  Administered 2019-07-18: 250 mg via INTRAMUSCULAR
  Filled 2019-07-18: qty 250

## 2019-07-18 NOTE — ED Notes (Signed)
Patient transported to X-ray 

## 2019-07-18 NOTE — ED Provider Notes (Signed)
[facility] EMERGENCY DEPARTMENT Provider Note   CSN: [id number] Arrival date & time: 07/18/19  1610    History   Chief Complaint Chief Complaint  Patient presents with  . Hand Pain  . Urinary Tract Infection    HPI Tanya Kirk is a 43 y.o. female presents today for 2 concerns.  Patient's initial concern is for possible STI.  Patient reports that she was sexually active with one partner up until 3 weeks ago, she reports they had intercourse without a condom, she has since broken up with this person and would not go into further detail but she is concerned for STI, she denies knowledge of her partner having any STI.  Patient is reporting some dysuria mild and intermittent, she reports that she is only felt this once or twice over the past 3 weeks and is having no dysuria today.  She denies any hematuria, vaginal discharge or vaginal bleeding.  She denies any fever/chills, lesions or rashes, abdominal pain, nausea/vomiting.  She reports that she was treated for a yeast infection 4 weeks ago with resolution of her symptoms.  Patient would like testing and treatment for STD today.  Patient second concern today is right third DIP pain she reports feeling that the finger is jammed, pain has been present for 2 days a mild throbbing sensation intermittent without aggravating or alleviating factors she denies any injury, she believes the area is somewhat swollen compared to contralateral side, denies color change, numbness, tingling or weakness.       HPI  Past Medical History:  Diagnosis Date  . Bulimia   . Depression     Patient Active Problem List   Diagnosis Date Noted  . 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 Cholecystitis 09/11/2016    Past Surgical History:  Procedure Laterality Date  . CHOLECYSTECTOMY N/A 09/12/2016   Procedure: LAPAROSCOPIC CHOLECYSTECTOMY;  Surgeon: Leighton Ruff, MD;  Location: [facility];  Service: General;  Laterality: N/A;     OB History    Gravida  1   Para      Term      Preterm      AB  1   Living  0     SAB      TAB  1   Ectopic      Multiple      Live Births               Home Medications    Prior to Admission medications   Medication Sig Start Date End Date Taking? Authorizing Provider  acetaminophen (TYLENOL) 500 MG tablet Take 1 tablet (500 mg total) by mouth every 6 (six) hours as needed. 05/22/18   Fawze, Mina A, PA-C  fluconazole (DIFLUCAN) 150 MG tablet Take 1 tablet (150 mg total) by mouth daily. 05/16/19   Loura Halt A, NP  ibuprofen (ADVIL,MOTRIN) 600 MG tablet Take 1 tablet (600 mg total) by mouth every 6 (six) hours as needed. 12/03/18   Blanchie Dessert, MD  Ibuprofen-Diphenhydramine Cit (ADVIL PM PO) Take 1 tablet by mouth at bedtime as needed (pain).     [provider]  loratadine (CLARITIN) 10 MG tablet Take 10 mg by mouth every other day.     [provider]    Family History Family History  Problem Relation Age of Onset  . Cancer Other   . Diabetes Other     Social History Social History   Tobacco Use  . Smoking status: Former Smoker  Packs/day: 0.10    Types: Cigarettes  . Smokeless tobacco: Never Used  Substance Use Topics  . Alcohol use: Yes    Comment: socially  . Drug use: Yes    Types: Cocaine, Marijuana    Comment: twice per month, occasional MJ     Allergies   Tetracyclines & related and Other   Review of Systems Review of Systems Ten systems are reviewed and are negative for acute change except as noted in the HPI  Physical Exam Updated Vital Signs BP 115/76 (BP Location: Right Arm)   Pulse 79   Temp 98.4 F (36.9 C) (Oral)   Resp 16   Ht 5' 4.5" (1.638 m)   Wt 82.6 kg   SpO2 98%   BMI 30.76 kg/m   Physical Exam Constitutional:      General: She is not in acute distress.    Appearance: Normal appearance. She is well-developed. She is not ill-appearing or diaphoretic.  HENT:     Head: Normocephalic and atraumatic.      Right Ear: External ear normal.     Left Ear: External ear normal.     Nose: Nose normal.  Eyes:     General: Vision grossly intact. Gaze aligned appropriately.     Pupils: Pupils are equal, round, and reactive to light.  Neck:     Musculoskeletal: Normal range of motion.     Trachea: Trachea and phonation normal. No tracheal deviation.  Pulmonary:     Effort: Pulmonary effort is normal. No respiratory distress.  Abdominal:     General: There is no distension.     Palpations: Abdomen is soft.     Tenderness: There is no abdominal tenderness. There is no guarding or rebound.  Genitourinary:    Comments: Exam chaperoned by Corrie Dandy.  Pelvic exam: normal external genitalia without evidence of trauma. VULVA: normal appearing vulva with no masses, tenderness or lesion. VAGINA: normal appearing vagina with normal color and discharge, no lesions. CERVIX: normal appearing cervix without lesions, cervical motion tenderness absent, cervical os closed without purulent discharge; vaginal discharge clear/white Wet prep and DNA probe for chlamydia and GC obtained.  ADNEXA: normal adnexa in size, nontender and no masses UTERUS: uterus is normal size, shape, consistency and nontender.  Musculoskeletal: Normal range of motion.       Hands:     Comments: Patient reports feeling as if her right third DIP is "jammed".  Right hand: No gross deformities, skin intact. Fingers appear normal. No tenderness to palpation.  No snuffbox tenderness to palpation. No tenderness to palpation over flexor sheath.  Finger adduction/abduction intact with 5/5 strength.  Thumb opposition intact. Full active and resisted ROM to flexion/extension at wrist, MCP, PIP and DIP of all fingers.  FDS/FDP intact. Grip 5/5 strength.  Radial artery 2+ with <2sec cap refill in all fingers.  Sensation intact to light-tough in median/ulnar/radial distributions. No pain at the wrist or elbow.  Skin:    General: Skin is warm and  dry.  Neurological:     Mental Status: She is alert.     GCS: GCS eye subscore is 4. GCS verbal subscore is 5. GCS motor subscore is 6.     Comments: Speech is clear and goal oriented, follows commands Major Cranial nerves without deficit, no facial droop Normal strength in upper extremities bilaterally strong and equal grip strength Sensation normal to light and sharp touch Moves extremities without ataxia, coordination intact Normal gait  Psychiatric:  Behavior: Behavior normal.    ED Treatments / Results  Labs (all labs ordered are listed, but only abnormal results are displayed) Labs Reviewed  WET PREP, GENITAL - Abnormal; Notable for the following components:      Result Value   WBC, Wet Prep HPF POC MODERATE (*)    All other components within normal limits  URINALYSIS, ROUTINE W REFLEX MICROSCOPIC - Abnormal; Notable for the following components:   APPearance HAZY (*)    Hgb urine dipstick MODERATE (*)    Leukocytes,Ua TRACE (*)    Bacteria, UA RARE (*)    All other components within normal limits  URINE CULTURE  RPR  HIV ANTIBODY (ROUTINE TESTING W REFLEX)  I-STAT BETA HCG BLOOD, ED ([facility], [facility], [facility] ONLY)  GC/CHLAMYDIA PROBE AMP ([place]) NOT AT [facility]    EKG None  Radiology Dg Finger Middle Right  Result Date: 07/18/2019 CLINICAL DATA:  Pain at DIP EXAM: RIGHT MIDDLE FINGER 2+V COMPARISON:  None. FINDINGS: No fracture or dislocation. Diffuse soft tissue swelling is seen. Normal bone mineralization seen throughout. IMPRESSION: No acute osseous injury. Electronically Signed   By: Prudencio Pair M.D.   On: 07/18/2019 19:00    Procedures Procedures (including critical care time)  Medications Ordered in ED Medications  cefTRIAXone (ROCEPHIN) injection 250 mg (has no administration in time range)  azithromycin (ZITHROMAX) tablet 1,000 mg (has no administration in time range)     Initial Impression / Assessment and Plan / ED Course  I have reviewed the triage  vital signs and the nursing notes.  Pertinent labs & imaging results that were available during my care of the patient were reviewed by me and considered in my medical decision making (see chart for details).    DG Right Middle Finger:  IMPRESSION:  No acute osseous injury.     Patient with likely sprain of the right middle finger.  Right upper extremity neurovascularly intact, no swelling or erythema to suggest infection, no sign of cellulitis, septic arthritis, DVT, compartment syndrome or gross ligamentous laxity.  No weakness, numbness or tingling.  No sign of emergent medical etiology of right middle finger DIP pain, should be placed in finger splint and referred to orthopedics as needed encouraged rice therapy. - Wet prep positive for moderate white blood cells Beta-hCG negative Urinalysis with moderate hemoglobin, trace leukocytes, rare bacteria, nitrite negative, patient denies dysuria, frequency, hesitancy or other urinary symptoms my exam will send for culture does not appear to be infectious at this time patient to be contacted by [facility] if culture necessitates antibiotics. - Patient also presents with concerns for possible STD. Patient understands that they have GC/Chlamydia cultures pending and that they will need to inform all sexual partners if results return positive. Patient has been treated prophylactically with azithromycin and Rocephin due to patient's history, pelvic exam, and wet prep with increased WBCs. Pt not concerning for PID because hemodynamically stable and no cervical motion tenderness on pelvic exam.  No evidence of trichomonas or bacterial vaginosis, no indication for Flagyl at this time. Patient to be discharged with instructions to follow up with OBGYN/PCP. Discussed importance of using protection when sexually active.   At this time there does not appear to be any evidence of an acute emergency medical condition and the patient appears stable for discharge with  appropriate outpatient follow up. Diagnosis was discussed with patient who verbalizes understanding of care plan and is agreeable to discharge. I have discussed return precautions with patient who verbalizes  understanding of return precautions. Patient encouraged to follow-up with their PCP. All questions answered.  Patient has been discharged in good condition.   Note: Portions of this report may have been transcribed using voice recognition software. Every effort was made to ensure accuracy; however, inadvertent computerized transcription errors may still be present. Final Clinical Impressions(s) / ED Diagnoses   Final diagnoses:  Possible exposure to STD  Other sprain of right middle finger, initial encounter    ED Discharge Orders    None       Gari Crown 07/18/19 2008    Quintella Reichert, MD 07/19/19 1217

## 2019-07-18 NOTE — Discharge Instructions (Addendum)
You have been diagnosed today with right middle finger sprain and concern for STD.  At this time there does not appear to be the presence of an emergent medical condition, however there is always the potential for conditions to change. Please read and follow the below instructions.  Please return to the Emergency Department immediately for any new or worsening symptoms. Please be sure to follow up with your Primary Care Provider within one week regarding your visit today; please call their office to schedule an appointment even if you are feeling better for a follow-up visit. You have been treated presumptively today for gonorrhea and chlamydia. You have been tested today for gonorrhea and chlamydia as well as HIV and syphilis. These results will be available in approximately 3 days. You may check your MyChart account for results. Please inform all sexual partners of positive results and that they should be tested and treated as well. Please wait 2 weeks and be sure that you and your partners are symptom free before returning to sexual activity. Please use protection with every sexual encounter.  You currently have a urine culture test pending, you will be contacted by [facility] health if this grows bacteria that need antibiotics for treatment. You may use the finger splint provided to you today to help with your right middle finger pain.  Use rest, ice and elevation to help with your symptoms.  If your pain does not improve you may follow-up with the orthopedic specialist office Dr. Caralyn Guile for further evaluation, despite reassuring x-rays today ligamentous, tendon injury or unseen fractures may still be present and follow-up is advised if not improved.  Follow Up: Please followup with your primary doctor in 3 days for discussion of your diagnoses and further evaluation after today's visit; if you do not have a primary care doctor use the resource guide provided to find one; Please return to the ER for  worsening symptoms, high fevers or persistent vomiting.  Get help right away if: You lose feeling in your finger. Your finger turns blue. Your finger feels colder than normal when you touch it. You have a fever. You have abdominal pain, nausea or vomiting You have pain when you pee or blood in your urine You have any new/concerning or worsening symptoms  Please read the additional information packets attached to your discharge summary.  Do not take your medicine if  develop an itchy rash, swelling in your mouth or lips, or difficulty breathing; call 911 and seek immediate emergency medical attention if this occurs.

## 2019-07-18 NOTE — ED Triage Notes (Signed)
Pt reports right middle finger pain for the past two days. Pt also reports that she also thinks she has a uti. Reports that she was treated for a yeast infection but continued to have some burning with urination. Also reports some vaginal itching. Pt also reports that her periods have been irregular as well.

## 2019-07-19 LAB — RPR: RPR Ser Ql: NONREACTIVE

## 2019-07-19 LAB — HIV ANTIBODY (ROUTINE TESTING W REFLEX): HIV Screen 4th Generation wRfx: NONREACTIVE

## 2019-07-20 LAB — GC/CHLAMYDIA PROBE AMP (~~LOC~~) NOT AT ARMC
Chlamydia: NEGATIVE
Neisseria Gonorrhea: NEGATIVE

## 2019-07-21 LAB — URINE CULTURE: Culture: 70000 — AB

## 2019-07-22 ENCOUNTER — Telehealth: Payer: Self-pay | Admitting: Emergency Medicine

## 2019-07-22 NOTE — Telephone Encounter (Signed)
Post ED Visit - Positive Culture Follow-up: Successful Patient Follow-Up  Culture assessed and recommendations reviewed by:  []  Elenor Quinones, Pharm.D. []  Heide Guile, Pharm.D., BCPS AQ-ID []  Parks Neptune, Pharm.D., BCPS []  Alycia Rossetti, Pharm.D., BCPS []  [place], Pharm.D., BCPS, AAHIVP []  Legrand Como, Pharm.D., BCPS, AAHIVP []  Salome Arnt, PharmD, BCPS []  Johnnette Gourd, PharmD, BCPS []  Hughes Better, PharmD, BCPS [x]  Elicia Lamp, PharmD  Positive urine culture  [x]  Patient discharged without antimicrobial prescription and treatment is now indicated []  Organism is resistant to prescribed ED discharge antimicrobial []  Patient with positive blood cultures  Changes discussed with ED provider: Perlie Mayo PA New antibiotic prescription: Nitrofurantoin 100 mg BID PO x five days Called to [facility]) [phone]  Contacted patient, date 07/22/2019, time [place] 07/22/2019, 1:41 PM

## 2019-07-22 NOTE — Progress Notes (Signed)
ED Antimicrobial Stewardship Positive Culture Follow Up   Tanya Kirk is an 43 y.o. female who presented to [facility] on 07/18/2019 with a chief complaint of  Chief Complaint  Patient presents with  . Hand Pain  . Urinary Tract Infection    Recent Results (from the past 720 hour(s))  Urine culture     Status: Abnormal   Collection Time: 07/18/19  6:28 PM   Specimen: Urine, Random  Result Value Ref Range Status   Specimen Description URINE, RANDOM  Final   Special Requests   Final    SITE NOT SPECIFIED Performed at [facility], [address], [place], [place] [address]    Culture 70,000 COLONIES/mL ESCHERICHIA COLI (A)  Final   Report Status 07/21/2019 FINAL  Final   Organism ID, Bacteria ESCHERICHIA COLI (A)  Final      Susceptibility   Escherichia coli - MIC*    AMPICILLIN 4 SENSITIVE Sensitive     CEFAZOLIN <=4 SENSITIVE Sensitive     CEFTRIAXONE <=1 SENSITIVE Sensitive     CIPROFLOXACIN <=0.25 SENSITIVE Sensitive     GENTAMICIN <=1 SENSITIVE Sensitive     IMIPENEM <=0.25 SENSITIVE Sensitive     NITROFURANTOIN <=16 SENSITIVE Sensitive     TRIMETH/SULFA <=20 SENSITIVE Sensitive     AMPICILLIN/SULBACTAM <=2 SENSITIVE Sensitive     PIP/TAZO <=4 SENSITIVE Sensitive     Extended ESBL NEGATIVE Sensitive     * 70,000 COLONIES/mL ESCHERICHIA COLI  Wet prep, genital     Status: Abnormal   Collection Time: 07/18/19  7:28 PM   Specimen: Cervical/Vaginal swab  Result Value Ref Range Status   Yeast Wet Prep HPF POC NONE SEEN NONE SEEN Final   Trich, Wet Prep NONE SEEN NONE SEEN Final   Clue Cells Wet Prep HPF POC NONE SEEN NONE SEEN Final   WBC, Wet Prep HPF POC MODERATE (A) NONE SEEN Final   Sperm NONE SEEN  Final    Comment: Performed at [facility], [place] [address], [place], [place] [address]    [x]  Patient discharged originally without antimicrobial agent and treatment may now be indicated  New antibiotic prescription: Flow Manager to call patient. If not  experiencing urinary symptoms (dysuria, frequency, urgency), then no further treatment indicated. If experiencing urinary symptoms, then start nitrofurantoin 100mg  PO BID x 5 days  ED Provider: Nuala Alpha, PA-C   [name] 07/22/2019, 10:23 AM Clinical Pharmacist Monday - Friday phone -  [phone] Saturday - Sunday phone - [phone]

## 2019-09-25 ENCOUNTER — Emergency Department (HOSPITAL_COMMUNITY)
Admission: EM | Admit: 2019-09-25 | Discharge: 2019-09-25 | Disposition: A | Discharge status: Home / Self Care | Payer: Self-pay | Attending: Emergency Medicine | Admitting: Emergency Medicine

## 2019-09-25 ENCOUNTER — Encounter (HOSPITAL_COMMUNITY): Payer: Self-pay

## 2019-09-25 ENCOUNTER — Other Ambulatory Visit: Payer: Self-pay

## 2019-09-25 DIAGNOSIS — Z8742 Personal history of other diseases of the female genital tract: Secondary | ICD-10-CM | POA: Insufficient documentation

## 2019-09-25 DIAGNOSIS — Z5321 Procedure and treatment not carried out due to patient leaving prior to being seen by health care provider: Secondary | ICD-10-CM | POA: Insufficient documentation

## 2019-09-25 DIAGNOSIS — R5383 Other fatigue: Secondary | ICD-10-CM | POA: Insufficient documentation

## 2019-09-25 LAB — URINALYSIS, ROUTINE W REFLEX MICROSCOPIC
Bilirubin Urine: NEGATIVE
Glucose, UA: NEGATIVE mg/dL
Hgb urine dipstick: NEGATIVE
Ketones, ur: NEGATIVE mg/dL
Leukocytes,Ua: NEGATIVE
Nitrite: NEGATIVE
Protein, ur: NEGATIVE mg/dL
Specific Gravity, Urine: 1.021 (ref 1.005–1.030)
pH: 7 (ref 5.0–8.0)

## 2019-09-25 NOTE — ED Notes (Signed)
Pt called x3 in the waiting room. No reply. 

## 2019-09-25 NOTE — ED Triage Notes (Signed)
Pt reports possible yeast infection for the past month. Pt states her usual symptoms are "nodding off throughout the day" hx of same with yeast infections in the past. Pt denies any vaginal discharge

## 2020-01-22 ENCOUNTER — Ambulatory Visit: Payer: Self-pay | Attending: Internal Medicine

## 2020-01-22 DIAGNOSIS — Z20822 Contact with and (suspected) exposure to covid-19: Secondary | ICD-10-CM

## 2020-01-23 LAB — NOVEL CORONAVIRUS, NAA: SARS-CoV-2, NAA: NOT DETECTED

## 2020-03-20 ENCOUNTER — Ambulatory Visit: Payer: Self-pay | Admitting: Internal Medicine

## 2020-03-24 ENCOUNTER — Ambulatory Visit: Payer: Self-pay

## 2020-04-21 ENCOUNTER — Ambulatory Visit: Payer: Self-pay | Attending: Internal Medicine | Admitting: Family

## 2020-04-21 ENCOUNTER — Other Ambulatory Visit: Payer: Self-pay

## 2020-04-21 ENCOUNTER — Encounter: Payer: Self-pay | Admitting: Internal Medicine

## 2020-04-21 VITALS — BP 112/81 | HR 70 | Temp 97.2°F | Resp 16 | Ht 64.5 in | Wt 165.4 lb

## 2020-04-21 DIAGNOSIS — B3731 Acute candidiasis of vulva and vagina: Secondary | ICD-10-CM

## 2020-04-21 DIAGNOSIS — M722 Plantar fascial fibromatosis: Secondary | ICD-10-CM

## 2020-04-21 DIAGNOSIS — N898 Other specified noninflammatory disorders of vagina: Secondary | ICD-10-CM

## 2020-04-21 NOTE — Progress Notes (Signed)
Patient ID: Tanya Kirk, female    DOB: 12-19-1975  MRN: [id number]  CC: New Patient (Initial Visit) and Foot Pain  Subjective: Tanya Kirk is a 44 y.o. female with history of cholecystitis and uterine adenomyoma who presents for foot pain.   1. BILATERAL FEET PAIN: Rating: 10/10 Radiation: denies Duration: pain lasts all day and wrosens throughout the day Characteristics: reports feels prickly like running through nails and fire  What makes it better: Using heating packs, ice packs, hot pots of water to soak feet, gel shoe insoles. All of these measures helps some. What makes it worse: standing for long periods of time especially after standing longer than 4 hours Medications and other therapies tried: Reports she has been treated for this same reason by providers in the past with Ibuprofen. States in the past she was referred for feet injections but was unable to receive them because she did not have health insurance. Swelling: denies  Falls: denies Weakness: denies  Numbness tingling: yes  Occupation: [employer], [place] she has lost 4 jobs in the past 3 years related to plantar fasciitis because she is unable to stand at work for longer than 4 hours. Reports about one and a half years ago she passed out losing consciousness when she returned home from working all day because of the pain from plantar fasciitis was severe. She did not seek medical attention for this occurrence. Reports when plantar fasciitis becomes moderate to severe she loses concentration.   Reports that she intends to apply for disability soon because she is unable to work with this condition. Patient reports that even though she is currently employed at [employer] she doesn't feel that they are going to keep her employed much longer because of the limitations of her condition and prefers to apply for disability now in anticipation of being unemployed. Reports she may be able to use disability  money as a supplement if she has to call out of work frequently and therefore will be getting a check from both her job and a disability check simultaneously.  2. INFECTION: Reports she feels like she has a bacterial infection or more specifically a yeast infection. Reports feeling sleepy while driving and riding in cars with others which she thinks is related to a yeast infection. Reports feeling tired all of the time in general. Reports she gets at least 8 hours of sleep each night. States that in the past when she has felt really tired she eventually went to a provider and was tested and determined to have a yeast infection.   Reports some providers in the past have told her that tiredness cannot be related to a yeast infection. However, patient states that she watches Dr. Abbe Amsterdam and Dorothy Puffer and they have both said on their talk shows that common side effects in women with a yeast infection is tiredness.   Reports she has been feeling tired for over 3 months. States 3 months ago she was tested at a local hospital for a yeast infection and the results were determined to be negative. Reports in the past she has been treated with Diflucan for a yeast infection and wasn't unable to tolerate the medication because it made her jittery.Reports she purchased Monistat over-the-counter 4 days ago and it helped for 1 day and she felt as if her energy was increased shortly after taking Monistat. Reports she has concerns for a yeast infection because she has multiple partners. Reports using  condoms for prevention.  Vaginal discharge: denies Odor: yes Itching: denies Vaginal burning: denies Dysuria: denies Pelvic pain: denies  Back pain: denies Fever: denies Genital sores: denies  Rash: denies Dyspareunia: denies   GI Symptoms: denies   Patient Active Problem List   Diagnosis Date Noted  . 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 Cholecystitis 09/11/2016     Current Outpatient Medications on File  Prior to Visit  Medication Sig Dispense Refill  . ibuprofen (ADVIL,MOTRIN) 600 MG tablet Take 1 tablet (600 mg total) by mouth every 6 (six) hours as needed. (Patient not taking: Reported on 04/21/2020) 20 tablet 0  . Ibuprofen-Diphenhydramine Cit (ADVIL PM PO) Take 1 tablet by mouth at bedtime as needed (pain).     Marland Kitchen loratadine (CLARITIN) 10 MG tablet Take 10 mg by mouth every other day.      No current facility-administered medications on file prior to visit.    Allergies  Allergen Reactions  . Tetracyclines & Related Anaphylaxis, Itching, Swelling and Other (See Comments)    Airway involvement.    . Other     Dogs: Itching     Social History   Socioeconomic History  . Marital status: Single    Spouse name: Not on file  . Number of children: Not on file  . Years of education: Not on file  . Highest education level: Not on file  Occupational History  . Not on file  Tobacco Use  . Smoking status: Former Smoker    Packs/day: 0.10    Types: Cigarettes  . Smokeless tobacco: Never Used  Substance and Sexual Activity  . Alcohol use: Yes    Comment: socially  . Drug use: Yes    Types: Cocaine, Marijuana    Comment: twice per month, occasional MJ  . Sexual activity: Yes    Birth control/protection: Condom  Other Topics Concern  . Not on file  Social History Narrative  . Not on file   Social Determinants of Health   Financial Resource Strain:   . Difficulty of Paying Living Expenses:   Food Insecurity:   . Worried About Charity fundraiser in the Last Year:   . Arboriculturist in the Last Year:   Transportation Needs:   . Film/video editor (Medical):   Marland Kitchen Lack of Transportation (Non-Medical):   Physical Activity:   . Days of Exercise per Week:   . Minutes of Exercise per Session:   Stress:   . Feeling of Stress :   Social Connections:   . Frequency of Communication with Friends and Family:   . Frequency of Social Gatherings with Friends and Family:   .  Attends Religious Services:   . Active Member of Clubs or Organizations:   . Attends Archivist Meetings:   Marland Kitchen Marital Status:   Intimate Partner Violence:   . Fear of Current or Ex-Partner:   . Emotionally Abused:   Marland Kitchen Physically Abused:   . Sexually Abused:     Family History  Problem Relation Age of Onset  . Cancer Other   . Diabetes Other     Past Surgical History:  Procedure Laterality Date  . CHOLECYSTECTOMY N/A 09/12/2016   Procedure: LAPAROSCOPIC CHOLECYSTECTOMY;  Surgeon: Leighton Ruff, MD;  Location: [facility] ORS;  Service: General;  Laterality: N/A;    ROS: Review of Systems Negative except as stated above  PHYSICAL EXAM: BP 112/81   Pulse 70   Temp (!) 97.2 F (36.2 C)   Resp  16   Ht 5' 4.5" (1.638 m)   Wt 165 lb 6.4 oz (75 kg)   SpO2 99%   BMI 27.95 kg/m   Physical Exam General appearance - alert, well appearing, and in no distress and oriented to person, place, and time Mental status - alert, oriented to person, place, and time, normal mood, behavior, speech, dress, motor activity, and thought processes Neck - supple, no significant adenopathy Lymphatics - no palpable lymphadenopathy, no hepatosplenomegaly Chest - clear to auscultation, no wheezes, rales or rhonchi, symmetric air entry, no tachypnea, retractions or cyanosis Heart - normal rate, regular rhythm, normal S1, S2, no murmurs, rubs, clicks or gallops Pelvic - exam declined by the patient Neurological - alert, oriented, normal speech, no focal findings or movement disorder noted, neck supple without rigidity, cranial nerves II through XII intact, funduscopic exam normal, discs flat and sharp, DTR's normal and symmetric, motor and sensory grossly normal bilaterally, normal muscle tone, no tremors, strength 5/5, Romberg sign negative, normal gait and station Musculoskeletal - no joint tenderness, deformity or swelling, no muscular tenderness noted, full range of motion without pain Extremities -  peripheral pulses normal, no pedal edema, no clubbing or cyanosis Skin - normal coloration and turgor, no rashes, no suspicious skin lesions noted   ASSESSMENT AND PLAN: 1. Plantar fasciitis, bilateral: -Continue Ibuprofen over-the-counter for management of pain. -Continue ice therapy and stretching as needed for pain management. -Referral to orthopedics and or podiatry for further management may be needed if symptoms worsen or persist.  -Offered patient to apply for [place] discount/orange card. Patient agreeable. -Follow-up with primary physician as needed.  2. Vaginal itching: -Obtain cervicovaginal ancillary today to screen for possible sexually transmitted infections.  - Cervicovaginal ancillary only.   Patient was given the opportunity to ask questions. Patient verbalized understanding of the plan and was able to repeat key elements of the plan. Patient was given clear instructions to go to Emergency Department or return to medical center if symptoms don't improve, worsen, or new problems develop.The patient verbalized understanding.   Camillia Herter, NP

## 2020-04-21 NOTE — Patient Instructions (Addendum)
Apply for [facility] financial discount/orange card. Apply for breast scholarship. Cervicovaginal swab for STI screening on today. Return in 1 or 2 months for well-woman exam with primary physician.   Plantar Fasciitis  Plantar fasciitis is a painful foot condition that affects the heel. It occurs when the band of tissue that connects the toes to the heel bone (plantar fascia) becomes irritated. This can happen as the result of exercising too much or doing other repetitive activities (overuse injury). The pain from plantar fasciitis can range from mild irritation to severe pain that makes it difficult to walk or move. The pain is usually worse in the morning after sleeping, or after sitting or lying down for a while. Pain may also be worse after long periods of walking or standing. What are the causes? This condition may be caused by:  Standing for long periods of time.  Wearing shoes that do not have good arch support.  Doing activities that put stress on joints (high-impact activities), including running, aerobics, and ballet.  Being overweight.  An abnormal way of walking (gait).  Tight muscles in the back of your lower leg (calf).  High arches in your feet.  Starting a new athletic activity. What are the signs or symptoms? The main symptom of this condition is heel pain. Pain may:  Be worse with first steps after a time of rest, especially in the morning after sleeping or after you have been sitting or lying down for a while.  Be worse after long periods of standing still.  Decrease after 30-45 minutes of activity, such as gentle walking. How is this diagnosed? This condition may be diagnosed based on your medical history and your symptoms. Your health care provider may ask questions about your activity level. Your health care provider will do a physical exam to check for:  A tender area on the bottom of your foot.  A high arch in your foot.  Pain when you move your  foot.  Difficulty moving your foot. You may have imaging tests to confirm the diagnosis, such as:  X-rays.  Ultrasound.  MRI. How is this treated? Treatment for plantar fasciitis depends on how severe your condition is. Treatment may include:  Rest, ice, applying pressure (compression), and raising the affected foot (elevation). This may be called RICE therapy. Your health care provider may recommend RICE therapy along with over-the-counter pain medicines to manage your pain.  Exercises to stretch your calves and your plantar fascia.  A splint that holds your foot in a stretched, upward position while you sleep (night splint).  Physical therapy to relieve symptoms and prevent problems in the future.  Injections of steroid medicine (cortisone) to relieve pain and inflammation.  Stimulating your plantar fascia with electrical impulses (extracorporeal shock wave therapy). This is usually the last treatment option before surgery.  Surgery, if other treatments have not worked after 12 months. Follow these instructions at home:  Managing pain, stiffness, and swelling  If directed, put ice on the painful area: ? Put ice in a plastic bag, or use a frozen bottle of water. ? Place a towel between your skin and the bag or bottle. ? Roll the bottom of your foot over the bag or bottle. ? Do this for 20 minutes, 2-3 times a day.  Wear athletic shoes that have air-sole or gel-sole cushions, or try wearing soft shoe inserts that are designed for plantar fasciitis.  Raise (elevate) your foot above the level of your heart while you  are sitting or lying down. Activity  Avoid activities that cause pain. Ask your health care provider what activities are safe for you.  Do physical therapy exercises and stretches as told by your health care provider.  Try activities and forms of exercise that are easier on your joints (low-impact). Examples include swimming, water aerobics, and biking. General  instructions  Take over-the-counter and prescription medicines only as told by your health care provider.  Wear a night splint while sleeping, if told by your health care provider. Loosen the splint if your toes tingle, become numb, or turn cold and blue.  Maintain a healthy weight, or work with your health care provider to lose weight as needed.  Keep all follow-up visits as told by your health care provider. This is important. Contact a health care provider if you:  Have symptoms that do not go away after caring for yourself at home.  Have pain that gets worse.  Have pain that affects your ability to move or do your daily activities. Summary  Plantar fasciitis is a painful foot condition that affects the heel. It occurs when the band of tissue that connects the toes to the heel bone (plantar fascia) becomes irritated.  The main symptom of this condition is heel pain that may be worse after exercising too much or standing still for a long time.  Treatment varies, but it usually starts with rest, ice, compression, and elevation (RICE therapy) and over-the-counter medicines to manage pain. This information is not intended to replace advice given to you by your health care provider. Make sure you discuss any questions you have with your health care provider. Document Revised: 11/04/2017 Document Reviewed: 09/19/2017 Elsevier Patient Education  2020 [employer].

## 2020-04-21 NOTE — Progress Notes (Signed)
Pt states she is having b/l feet pain

## 2020-04-23 LAB — CERVICOVAGINAL ANCILLARY ONLY
Bacterial Vaginitis (gardnerella): NEGATIVE
Candida Glabrata: NEGATIVE
Candida Vaginitis: POSITIVE — AB
Chlamydia: NEGATIVE
Comment: NEGATIVE
Comment: NEGATIVE
Comment: NEGATIVE
Comment: NEGATIVE
Comment: NEGATIVE
Comment: NORMAL
Neisseria Gonorrhea: NEGATIVE
Trichomonas: NEGATIVE

## 2020-04-24 MED ORDER — FLUCONAZOLE 150 MG PO TABS: 150.0000 mg | ORAL_TABLET | Freq: Once | ORAL | 0 refills | Status: AC

## 2020-04-24 NOTE — Progress Notes (Signed)
Please call patient with update.   Patient has yeast infection. Fluconazole 1 tablet (150 mg total) for a one time dose has been ordered and sent to patient's pharmacy on file.   Follow-up as needed.

## 2020-04-24 NOTE — Addendum Note (Signed)
Addended by: Camillia Herter on: 04/24/2020 04:57 PM   Modules accepted: Orders

## 2020-04-26 ENCOUNTER — Telehealth: Payer: Self-pay

## 2020-04-26 NOTE — Telephone Encounter (Signed)
Contacted pt to go over swab results pt cell number is not in service. Contacted the home number and a guy answered the phone and stated he is not home at the moment. Made the man aware to have pt give [place] a call Monday to go over results

## 2020-05-02 ENCOUNTER — Ambulatory Visit: Payer: Self-pay

## 2020-05-08 ENCOUNTER — Ambulatory Visit: Payer: Self-pay

## 2020-05-20 ENCOUNTER — Ambulatory Visit: Payer: Self-pay

## 2020-05-27 ENCOUNTER — Ambulatory Visit: Payer: Self-pay

## 2020-06-11 ENCOUNTER — Encounter (HOSPITAL_COMMUNITY): Payer: Self-pay | Admitting: *Deleted

## 2020-06-11 ENCOUNTER — Other Ambulatory Visit: Payer: Self-pay

## 2020-06-11 ENCOUNTER — Emergency Department (HOSPITAL_COMMUNITY)
Admission: EM | Admit: 2020-06-11 | Discharge: 2020-06-11 | Disposition: A | Discharge status: Home / Self Care | Payer: Self-pay | Attending: Emergency Medicine | Admitting: Emergency Medicine

## 2020-06-11 DIAGNOSIS — R21 Rash and other nonspecific skin eruption: Secondary | ICD-10-CM | POA: Insufficient documentation

## 2020-06-11 DIAGNOSIS — Z5321 Procedure and treatment not carried out due to patient leaving prior to being seen by health care provider: Secondary | ICD-10-CM | POA: Insufficient documentation

## 2020-06-11 NOTE — ED Notes (Signed)
Pt says that she just wants to go to her regular doctor, discussed that she is free to return if she wishes

## 2020-06-11 NOTE — ED Triage Notes (Signed)
Rash on her hands for about 2 weeks, says she "filed down" the areas and peeled them. Denies itching.

## 2020-06-23 ENCOUNTER — Ambulatory Visit: Payer: Self-pay | Attending: Internal Medicine | Admitting: Internal Medicine

## 2020-06-23 ENCOUNTER — Other Ambulatory Visit: Payer: Self-pay

## 2021-02-19 ENCOUNTER — Telehealth: Payer: Self-pay

## 2021-02-19 NOTE — Telephone Encounter (Signed)
Patient called, left message, requested return call regarding scheduling a mammogram. Left message with relative (Mom) requesting return call.

## 2021-03-11 ENCOUNTER — Other Ambulatory Visit: Payer: Self-pay | Admitting: Obstetrics and Gynecology

## 2021-03-11 DIAGNOSIS — Z1231 Encounter for screening mammogram for malignant neoplasm of breast: Secondary | ICD-10-CM

## 2021-03-26 ENCOUNTER — Ambulatory Visit: Payer: Self-pay | Admitting: *Deleted

## 2021-03-26 ENCOUNTER — Other Ambulatory Visit: Payer: Self-pay

## 2021-03-26 ENCOUNTER — Ambulatory Visit
Admission: RE | Admit: 2021-03-26 | Discharge: 2021-03-26 | Disposition: A | Discharge status: Home / Self Care | Payer: No Typology Code available for payment source | Source: Ambulatory Visit | Attending: Obstetrics and Gynecology | Admitting: Obstetrics and Gynecology

## 2021-03-26 VITALS — BP 120/78 | Wt 165.6 lb

## 2021-03-26 DIAGNOSIS — Z124 Encounter for screening for malignant neoplasm of cervix: Secondary | ICD-10-CM

## 2021-03-26 DIAGNOSIS — Z01419 Encounter for gynecological examination (general) (routine) without abnormal findings: Secondary | ICD-10-CM

## 2021-03-26 DIAGNOSIS — Z1231 Encounter for screening mammogram for malignant neoplasm of breast: Secondary | ICD-10-CM

## 2021-03-26 NOTE — Patient Instructions (Signed)
Explained breast self awareness with Georgie Chard. Pap smear completed today. Let her know [facility] will cover Pap smears and HPV typing every 5 years unless has a history of abnormal Pap smears. Referred patient to the [facility] for a screening mammogram on mobile unit. Appointment scheduled Thursday, March 26, 2020 at 1150. Patient escorted to the mobile unit following [facility] appointment for her screening mammogram. Let patient know will follow up with her within the next week with results of her wet prep and Pap smear by phone. Informed patient that [facility] will follow up with her within a couple weeks with results of her mammogram by letter or phone. Georgie Chard verbalized understanding.  Kingslee Dowse, Arvil Chaco, RN 11:50 AM

## 2021-03-26 NOTE — Progress Notes (Signed)
[name] is a 45 y.o. G1P0010 female who presents to [facility] clinic today with complaint of diffuse left breast pain around the time of her menstrual period that she rates at a 7 out of 10.    Pap Smear: Pap smear completed today. Last Pap smear was 02/16/2016 at [facility] and was normal with negative HPV. Per patient has no history of an abnormal Pap smear. Last Pap smear result is available in Epic.  Physical exam: Breasts Left breast slightly larger than right breast that per patient has not noticed any changes. No skin abnormalities bilateral breasts. No nipple retraction bilateral breasts. No nipple discharge bilateral breasts. No lymphadenopathy. No lumps palpated bilateral breasts. No complaints of pain or tenderness on exam.       Pelvic/Bimanual Ext Genitalia No lesions, no swelling and no discharge observed on external genitalia.        Vagina Vagina pink and normal texture. No lesions and thick white discharge observed in vagina. Wet prep completed.       Cervix Cervix is present. Cervix pink and of normal texture. No discharge observed.    Uterus Uterus is present and palpable. Uterus in normal position and normal size.        Adnexae Bilateral ovaries present and palpable. No tenderness on palpation.         Rectovaginal No rectal exam completed today since patient had no rectal complaints. No skin abnormalities observed on exam.     Smoking History: Patient has never smoked.   Patient Navigation: Patient education provided. Access to services provided for patient through [facility] program.   Colorectal Cancer Screening: Per patient has never had colonoscopy completed. No complaints today.    Breast and Cervical Cancer Risk Assessment: Patient does not have family history of breast cancer, known genetic mutations, or radiation treatment to the chest before age 57. Patient does not have history of cervical dysplasia,  immunocompromised, or DES exposure in-utero.  Risk Assessment    Risk Scores      03/26/2021   Last edited by: Demetrius Revel, LPN   5-year risk: 0.9 %   Lifetime risk: 9.2 %          A: [facility]CP exam with pap smear No complaints.  P: Referred patient to the [facility] for a screening mammogram on mobile unit. Appointment scheduled Thursday, March 26, 2020 at 1150.  Loletta Parish, RN 03/26/2021 11:50 AM

## 2021-03-27 LAB — CERVICOVAGINAL ANCILLARY ONLY
Bacterial Vaginitis (gardnerella): NEGATIVE
Candida Glabrata: NEGATIVE
Candida Vaginitis: NEGATIVE
Comment: NEGATIVE
Comment: NEGATIVE
Comment: NEGATIVE
Comment: NEGATIVE
Trichomonas: NEGATIVE

## 2021-03-30 ENCOUNTER — Other Ambulatory Visit: Payer: Self-pay | Admitting: Obstetrics and Gynecology

## 2021-03-30 DIAGNOSIS — R928 Other abnormal and inconclusive findings on diagnostic imaging of breast: Secondary | ICD-10-CM

## 2021-03-30 LAB — CYTOLOGY - PAP
Comment: NEGATIVE
Diagnosis: NEGATIVE
High risk HPV: NEGATIVE

## 2021-03-31 ENCOUNTER — Telehealth: Payer: Self-pay

## 2021-03-31 NOTE — Telephone Encounter (Signed)
Attempted to contact patient regarding lab results (pap/Wet prep). Unable to leave message, voicemail box full.

## 2021-04-07 ENCOUNTER — Telehealth: Payer: Self-pay

## 2021-04-07 NOTE — Telephone Encounter (Signed)
Left message with adult female relative, will call back tomorrow.

## 2021-04-10 ENCOUNTER — Telehealth: Payer: Self-pay

## 2021-04-10 NOTE — Telephone Encounter (Signed)
Patient informed negative Pap/HPV, Wet Prep results, next pap due in 5 years. Patient verbalized understanding.

## 2021-04-16 ENCOUNTER — Inpatient Hospital Stay: Admission: RE | Admit: 2021-04-16 | Payer: No Typology Code available for payment source | Source: Ambulatory Visit

## 2021-04-23 ENCOUNTER — Other Ambulatory Visit: Payer: No Typology Code available for payment source

## 2021-05-19 ENCOUNTER — Other Ambulatory Visit: Payer: No Typology Code available for payment source

## 2021-05-23 ENCOUNTER — Other Ambulatory Visit: Payer: No Typology Code available for payment source

## 2021-05-30 ENCOUNTER — Other Ambulatory Visit: Payer: Self-pay

## 2021-05-30 ENCOUNTER — Encounter (HOSPITAL_COMMUNITY): Payer: Self-pay | Admitting: Emergency Medicine

## 2021-05-30 ENCOUNTER — Emergency Department (HOSPITAL_COMMUNITY)
Admission: EM | Admit: 2021-05-30 | Discharge: 2021-05-31 | Disposition: A | Discharge status: Home / Self Care | Payer: No Typology Code available for payment source | Attending: Emergency Medicine | Admitting: Emergency Medicine

## 2021-05-30 ENCOUNTER — Other Ambulatory Visit: Payer: No Typology Code available for payment source

## 2021-05-30 ENCOUNTER — Emergency Department (HOSPITAL_COMMUNITY): Payer: No Typology Code available for payment source

## 2021-05-30 DIAGNOSIS — R197 Diarrhea, unspecified: Secondary | ICD-10-CM | POA: Insufficient documentation

## 2021-05-30 DIAGNOSIS — Z20822 Contact with and (suspected) exposure to covid-19: Secondary | ICD-10-CM | POA: Insufficient documentation

## 2021-05-30 DIAGNOSIS — J3489 Other specified disorders of nose and nasal sinuses: Secondary | ICD-10-CM | POA: Insufficient documentation

## 2021-05-30 LAB — CBC WITH DIFFERENTIAL/PLATELET
Abs Immature Granulocytes: 0.02 10*3/uL (ref 0.00–0.07)
Basophils Absolute: 0 10*3/uL (ref 0.0–0.1)
Basophils Relative: 0 %
Eosinophils Absolute: 0.2 10*3/uL (ref 0.0–0.5)
Eosinophils Relative: 2 %
HCT: 41.9 % (ref 36.0–46.0)
Hemoglobin: 13.7 g/dL (ref 12.0–15.0)
Immature Granulocytes: 0 %
Lymphocytes Relative: 31 %
Lymphs Abs: 2.3 10*3/uL (ref 0.7–4.0)
MCH: 29.2 pg (ref 26.0–34.0)
MCHC: 32.7 g/dL (ref 30.0–36.0)
MCV: 89.3 fL (ref 80.0–100.0)
Monocytes Absolute: 0.8 10*3/uL (ref 0.1–1.0)
Monocytes Relative: 11 %
Neutro Abs: 4.3 10*3/uL (ref 1.7–7.7)
Neutrophils Relative %: 56 %
Platelets: 374 10*3/uL (ref 150–400)
RBC: 4.69 MIL/uL (ref 3.87–5.11)
RDW: 12.7 % (ref 11.5–15.5)
WBC: 7.6 10*3/uL (ref 4.0–10.5)
nRBC: 0 % (ref 0.0–0.2)

## 2021-05-30 LAB — COMPREHENSIVE METABOLIC PANEL
ALT: 21 U/L (ref 0–44)
AST: 25 U/L (ref 15–41)
Albumin: 3.9 g/dL (ref 3.5–5.0)
Alkaline Phosphatase: 59 U/L (ref 38–126)
Anion gap: 6 (ref 5–15)
BUN: 10 mg/dL (ref 6–20)
CO2: 27 mmol/L (ref 22–32)
Calcium: 9.1 mg/dL (ref 8.9–10.3)
Chloride: 104 mmol/L (ref 98–111)
Creatinine, Ser: 0.8 mg/dL (ref 0.44–1.00)
GFR, Estimated: >60 mL/min (ref >60)
Glucose, Bld: 92 mg/dL (ref 70–99)
Potassium: 3.8 mmol/L (ref 3.5–5.1)
Sodium: 137 mmol/L (ref 135–145)
Total Bilirubin: 0.4 mg/dL (ref 0.3–1.2)
Total Protein: 7.2 g/dL (ref 6.5–8.1)

## 2021-05-30 LAB — RESP PANEL BY RT-PCR (FLU A&B, COVID) ARPGX2
Influenza A by PCR: NEGATIVE
Influenza B by PCR: NEGATIVE
SARS Coronavirus 2 by RT PCR: NEGATIVE

## 2021-05-30 NOTE — ED Provider Notes (Signed)
Emergency Medicine Provider Triage Evaluation Note  Tanya Kirk , a 45 y.o. female  was evaluated in triage.  Pt complains of 1.5 weeks of cough, sore throat, runny nose, and 3 episodes of diarrhea without abdominal pain.  No fever.  No known COVID exposures, has not been tested.  Review of Systems  Positive: Diarrhea, cough, sore throat Negative: Abd pain, fever  Physical Exam  BP 108/81 (BP Location: Left Arm)   Pulse 74   Temp 98.5 F (36.9 C)   Resp 16   SpO2 99%  Gen:   Awake, no distress   Resp:  Normal effort  MSK:   Moves extremities without difficulty    Medical Decision Making  Medically screening exam initiated at 6:47 PM.  Appropriate orders placed.  Tanya Kirk was informed that the remainder of the evaluation will be completed by another provider, this initial triage assessment does not replace that evaluation, and the importance of remaining in the ED until their evaluation is complete.     Kabe Mckoy, [place] N, PA-C 05/30/21 Chrissie Noa, MD 06/04/21 [phone]

## 2021-05-30 NOTE — ED Triage Notes (Signed)
Pt reports bowel incontinence/diarrhea x 3 in the past 1 1/2 weeks. Also reports runny nose and clear productive cough.  Thought it may be related to moth balls she put around her room or energy drinks she has been drinking.  Denies abd pain.

## 2021-05-31 MED ORDER — LOPERAMIDE HCL 2 MG PO CAPS: 2.0000 mg | ORAL_CAPSULE | Freq: Four times a day (QID) | ORAL | 0 refills | Status: AC | PRN

## 2021-06-20 NOTE — ED Provider Notes (Signed)
[facility] Provider Note   CSN: [id number] Arrival date & time: 05/30/21  1739     History Chief Complaint  Patient presents with   Diarrhea    Tanya Kirk is a 45 y.o. female.   Diarrhea Quality:  Watery Severity:  Mild Onset quality:  Gradual Timing:  Constant Progression:  Unchanged Relieved by:  None tried Worsened by:  Nothing Ineffective treatments:  None tried Associated symptoms: cough and URI   Associated symptoms: no abdominal pain       Past Medical History:  Diagnosis Date   Bulimia    Depression     Patient Active Problem List   Diagnosis Date Noted   Uterine adenomyoma 10/04/2017   Cholecystitis 09/11/2016    Past Surgical History:  Procedure Laterality Date   CHOLECYSTECTOMY N/A 09/12/2016   Procedure: LAPAROSCOPIC CHOLECYSTECTOMY;  Surgeon: Leighton Ruff, MD;  Location: [facility];  Service: General;  Laterality: N/A;     OB History     Gravida  1   Para      Term      Preterm      AB  1   Living  0      SAB      IAB  1   Ectopic      Multiple      Live Births              Family History  Problem Relation Age of Onset   Cancer Mother        gum cancer   Cancer Other    Diabetes Other     Social History   Tobacco Use   Smoking status: Never   Smokeless tobacco: Never  Vaping Use   Vaping Use: Never used  Substance Use Topics   Alcohol use: Yes    Comment: socially   Drug use: Yes    Types: Cocaine, Marijuana    Comment: twice per month, occasional MJ    Home Medications Prior to Admission medications   Medication Sig Start Date End Date Taking? Authorizing Provider  loperamide (IMODIUM) 2 MG capsule Take 1 capsule (2 mg total) by mouth 4 (four) times daily as needed for diarrhea or loose stools. 05/31/21  Yes Elsie Baynes, Corene Cornea, MD  ibuprofen (ADVIL,MOTRIN) 600 MG tablet Take 1 tablet (600 mg total) by mouth every 6 (six) hours as needed. Patient not taking:  Reported on 04/21/2020 12/03/18   Blanchie Dessert, MD  Ibuprofen-Diphenhydramine Cit (ADVIL PM PO) Take 1 tablet by mouth at bedtime as needed (pain).     [provider]  loratadine (CLARITIN) 10 MG tablet Take 10 mg by mouth every other day.     [provider]    Allergies    Tetracyclines & related and Other  Review of Systems   Review of Systems  Gastrointestinal:  Positive for diarrhea. Negative for abdominal pain.  All other systems reviewed and are negative.  Physical Exam Updated Vital Signs BP 104/73 (BP Location: Right Arm)   Pulse 66   Temp 98.9 F (37.2 C) (Oral)   Resp 18   SpO2 100%   Physical Exam Vitals and nursing note reviewed.  Constitutional:      Appearance: She is well-developed.  HENT:     Head: Normocephalic and atraumatic.  Eyes:     Pupils: Pupils are equal, round, and reactive to light.  Cardiovascular:     Rate and Rhythm: Normal rate and regular rhythm.  Pulmonary:     Effort: No respiratory distress.     Breath sounds: No stridor.  Abdominal:     General: There is no distension.  Musculoskeletal:        General: No swelling or tenderness. Normal range of motion.     Cervical back: Normal range of motion.  Skin:    General: Skin is warm and dry.  Neurological:     General: No focal deficit present.     Mental Status: She is alert.    ED Results / Procedures / Treatments   Labs (all labs ordered are listed, but only abnormal results are displayed) Labs Reviewed  RESP PANEL BY RT-PCR (FLU A&B, COVID) ARPGX2  COMPREHENSIVE METABOLIC PANEL  CBC WITH DIFFERENTIAL/PLATELET    EKG None  Radiology No results found.  Procedures Procedures   Medications Ordered in ED Medications - No data to display  ED Course  I have reviewed the triage vital signs and the nursing notes.  Pertinent labs & imaging results that were available during my care of the patient were reviewed by me and considered in my medical  decision making (see chart for details).    MDM Rules/Calculators/A&P                          URI/diarrhea. No other obvious complications. Tolerating PO. Appears well. Stable for discharge.   Final Clinical Impression(s) / ED Diagnoses Final diagnoses:  Diarrhea, unspecified type  Rhinorrhea    Rx / DC Orders ED Discharge Orders          Ordered    loperamide (IMODIUM) 2 MG capsule  4 times daily PRN        05/31/21 0455             Shayon Trompeter, Corene Cornea, MD 06/20/21 0505

## 2021-06-22 ENCOUNTER — Other Ambulatory Visit: Payer: No Typology Code available for payment source

## 2021-07-09 ENCOUNTER — Ambulatory Visit
Admission: RE | Admit: 2021-07-09 | Discharge: 2021-07-09 | Disposition: A | Discharge status: Home / Self Care | Payer: No Typology Code available for payment source | Source: Ambulatory Visit | Attending: Obstetrics and Gynecology | Admitting: Obstetrics and Gynecology

## 2021-07-09 ENCOUNTER — Other Ambulatory Visit: Payer: Self-pay

## 2021-07-09 DIAGNOSIS — R928 Other abnormal and inconclusive findings on diagnostic imaging of breast: Secondary | ICD-10-CM

## 2021-09-14 ENCOUNTER — Ambulatory Visit: Payer: Self-pay

## 2021-09-14 NOTE — Telephone Encounter (Signed)
Patient called want to get flagyl for a yeast infection.  Pt states that flagyl works for her yeast infections and that she cannot use monistat as it is too harsh.  Wanted me to add a note to her file so that she could get flagyl easily.  Pt will make an appointment with the health department for testing.  Reason for Disposition  Symptoms of a vaginal yeast infection (i.e., white, thick, cottage-cheese-like, itchy, not bad smelling discharge)  Answer Assessment - Initial Assessment Questions 1. DISCHARGE: "Describe the discharge." (e.g., white, yellow, green, [place], foamy, cottage cheese-like)     Cottage cheese 2. ODOR: "Is there a bad odor?"     no 3. ONSET: "When did the discharge begin?"     A few days ago 4. RASH: "Is there a rash in that area?" If Yes, ask: "Describe it." (e.g., redness, blisters, sores, bumps)     no 5. ABDOMINAL PAIN: "Are you having any abdominal pain?" If Yes, ask: "What does it feel like? " (e.g., crampy, dull, intermittent, constant)      no 6. ABDOMINAL PAIN SEVERITY: If present, ask: "How bad is it?"  (e.g., mild, moderate, severe)  - MILD - doesn't interfere with normal activities   - MODERATE - interferes with normal activities or awakens from sleep   - SEVERE - patient doesn't want to move (R/O peritonitis)      none 7. CAUSE: "What do you think is causing the discharge?" "Have you had the same problem before? What happened then?"     yeast 8. OTHER SYMPTOMS: "Do you have any other symptoms?" (e.g., fever, itching, vaginal bleeding, pain with urination, injury to genital area, vaginal foreign body)     no 9. PREGNANCY: "Is there any chance you are pregnant?" "When was your last menstrual period?"     na  Protocols used: Vaginal Discharge-A-AH

## 2021-11-15 ENCOUNTER — Emergency Department (HOSPITAL_COMMUNITY)
Admission: EM | Admit: 2021-11-15 | Discharge: 2021-11-15 | Disposition: A | Discharge status: Home / Self Care | Payer: No Typology Code available for payment source | Attending: Emergency Medicine | Admitting: Emergency Medicine

## 2021-11-15 ENCOUNTER — Other Ambulatory Visit: Payer: Self-pay

## 2021-11-15 ENCOUNTER — Encounter (HOSPITAL_COMMUNITY): Payer: Self-pay | Admitting: Emergency Medicine

## 2021-11-15 DIAGNOSIS — R21 Rash and other nonspecific skin eruption: Secondary | ICD-10-CM | POA: Insufficient documentation

## 2021-11-15 DIAGNOSIS — N898 Other specified noninflammatory disorders of vagina: Secondary | ICD-10-CM | POA: Insufficient documentation

## 2021-11-15 DIAGNOSIS — Z8542 Personal history of malignant neoplasm of other parts of uterus: Secondary | ICD-10-CM | POA: Insufficient documentation

## 2021-11-15 DIAGNOSIS — R102 Pelvic and perineal pain: Secondary | ICD-10-CM | POA: Insufficient documentation

## 2021-11-15 LAB — CBC WITH DIFFERENTIAL/PLATELET
Abs Immature Granulocytes: 0.02 10*3/uL (ref 0.00–0.07)
Basophils Absolute: 0 10*3/uL (ref 0.0–0.1)
Basophils Relative: 1 %
Eosinophils Absolute: 0.1 10*3/uL (ref 0.0–0.5)
Eosinophils Relative: 1 %
HCT: 44.1 % (ref 36.0–46.0)
Hemoglobin: 14.6 g/dL (ref 12.0–15.0)
Immature Granulocytes: 0 %
Lymphocytes Relative: 24 %
Lymphs Abs: 1.8 10*3/uL (ref 0.7–4.0)
MCH: 29.3 pg (ref 26.0–34.0)
MCHC: 33.1 g/dL (ref 30.0–36.0)
MCV: 88.4 fL (ref 80.0–100.0)
Monocytes Absolute: 0.5 10*3/uL (ref 0.1–1.0)
Monocytes Relative: 6 %
Neutro Abs: 5 10*3/uL (ref 1.7–7.7)
Neutrophils Relative %: 68 %
Platelets: 427 10*3/uL — ABNORMAL HIGH (ref 150–400)
RBC: 4.99 MIL/uL (ref 3.87–5.11)
RDW: 12.1 % (ref 11.5–15.5)
WBC: 7.4 10*3/uL (ref 4.0–10.5)
nRBC: 0 % (ref 0.0–0.2)

## 2021-11-15 LAB — BASIC METABOLIC PANEL
Anion gap: 7 (ref 5–15)
BUN: 11 mg/dL (ref 6–20)
CO2: 24 mmol/L (ref 22–32)
Calcium: 9.3 mg/dL (ref 8.9–10.3)
Chloride: 106 mmol/L (ref 98–111)
Creatinine, Ser: 0.67 mg/dL (ref 0.44–1.00)
GFR, Estimated: >60 mL/min (ref >60)
Glucose, Bld: 147 mg/dL — ABNORMAL HIGH (ref 70–99)
Potassium: 3.6 mmol/L (ref 3.5–5.1)
Sodium: 137 mmol/L (ref 135–145)

## 2021-11-15 LAB — URINALYSIS, ROUTINE W REFLEX MICROSCOPIC
Bilirubin Urine: NEGATIVE
Glucose, UA: NEGATIVE mg/dL
Ketones, ur: NEGATIVE mg/dL
Leukocytes,Ua: NEGATIVE
Nitrite: NEGATIVE
Protein, ur: NEGATIVE mg/dL
Specific Gravity, Urine: 1.028 (ref 1.005–1.030)
pH: 5 (ref 5.0–8.0)

## 2021-11-15 LAB — I-STAT BETA HCG BLOOD, ED (MC, WL, AP ONLY): I-stat hCG, quantitative: <5 m[IU]/mL (ref <5)

## 2021-11-15 LAB — WET PREP, GENITAL
Clue Cells Wet Prep HPF POC: NONE SEEN
Sperm: NONE SEEN
Trich, Wet Prep: NONE SEEN
WBC, Wet Prep HPF POC: <10 (ref <10)
Yeast Wet Prep HPF POC: NONE SEEN

## 2021-11-15 LAB — HIV ANTIBODY (ROUTINE TESTING W REFLEX): HIV Screen 4th Generation wRfx: NONREACTIVE

## 2021-11-15 MED ORDER — CEFTRIAXONE SODIUM 500 MG IJ SOLR: 500.0000 mg | Freq: Once | INTRAMUSCULAR | Status: DC

## 2021-11-15 MED ORDER — AZITHROMYCIN 250 MG PO TABS: 1000.0000 mg | ORAL_TABLET | Freq: Once | ORAL | Status: DC

## 2021-11-15 MED ORDER — LIDOCAINE HCL (PF) 1 % IJ SOLN: 1.0000 mL | Freq: Once | INTRAMUSCULAR | Status: DC

## 2021-11-15 NOTE — ED Provider Notes (Signed)
[facility] EMERGENCY DEPARTMENT Provider Note   CSN: [id number] Arrival date & time: 11/15/21  1756     History Chief Complaint  Patient presents with   Vaginal Pain    Tanya Kirk is a 45 y.o. female with reported history of recurrent yeast infections.  Presents to the emergency department with a chief complaint of vaginal pain and rash.  Patient reports that vaginal pain has been present over the last 2 weeks.  Patient describes pain as a "burning sensation," to her clitoris.  Pain has been intermittent over these last 2 weeks.  No aggravating or alleviating factors.  Patient has tried taking ibuprofen with no relief of symptoms.  Patient endorses some dysuria over this time as well.  Denies any vaginal discharge, dysuria, hematuria, urinary frequency, urinary urgency, pelvic pain.  Patient reports that she noticed a rash to her left AC earlier this morning and is concerned she may have a rash to her right lower extremity over the last week.  Patient denies any associated pain or pruritus.  Patient is nervous that this may be due to a spider bite.  Denies any known bites.  Patient is sexually active with multiple female partners.  Patient states that she always uses condoms 1 having penetrative intercourse.  Last menstrual period 3 weeks prior.  G1 P0-0-1-0   Vaginal Pain Pertinent negatives include no chest pain, no abdominal pain, no headaches and no shortness of breath.      Past Medical History:  Diagnosis Date   Bulimia    Depression     Patient Active Problem List   Diagnosis Date Noted   Uterine adenomyoma 10/04/2017   Cholecystitis 09/11/2016    Past Surgical History:  Procedure Laterality Date   CHOLECYSTECTOMY N/A 09/12/2016   Procedure: LAPAROSCOPIC CHOLECYSTECTOMY;  Surgeon: Leighton Ruff, MD;  Location: [facility];  Service: General;  Laterality: N/A;     OB History     Gravida  1   Para      Term      Preterm      AB  1   Living   0      SAB      IAB  1   Ectopic      Multiple      Live Births              Family History  Problem Relation Age of Onset   Cancer Mother        gum cancer   Cancer Other    Diabetes Other     Social History   Tobacco Use   Smoking status: Never   Smokeless tobacco: Never  Vaping Use   Vaping Use: Never used  Substance Use Topics   Alcohol use: Yes    Comment: socially   Drug use: Yes    Types: Cocaine, Marijuana    Comment: twice per month, occasional MJ    Home Medications Prior to Admission medications   Medication Sig Start Date End Date Taking? Authorizing Provider  ibuprofen (ADVIL,MOTRIN) 600 MG tablet Take 1 tablet (600 mg total) by mouth every 6 (six) hours as needed. Patient not taking: Reported on 04/21/2020 12/03/18   Blanchie Dessert, MD  Ibuprofen-Diphenhydramine Cit (ADVIL PM PO) Take 1 tablet by mouth at bedtime as needed (pain).     [provider]  loperamide (IMODIUM) 2 MG capsule Take 1 capsule (2 mg total) by mouth 4 (four) times daily as needed for  diarrhea or loose stools. 05/31/21   Mesner, Corene Cornea, MD  loratadine (CLARITIN) 10 MG tablet Take 10 mg by mouth every other day.     [provider]    Allergies    Tetracyclines & related and Other  Review of Systems   Review of Systems  Constitutional:  Negative for chills and fever.  Eyes:  Negative for visual disturbance.  Respiratory:  Negative for shortness of breath.   Cardiovascular:  Negative for chest pain.  Gastrointestinal:  Negative for abdominal distention, abdominal pain, anal bleeding, blood in stool, constipation, diarrhea, nausea and vomiting.  Genitourinary:  Positive for vaginal pain. Negative for decreased urine volume, difficulty urinating, dyspareunia, dysuria, enuresis, flank pain, frequency, genital sores, hematuria, menstrual problem, pelvic pain, urgency, vaginal bleeding and vaginal discharge.  Musculoskeletal:  Negative for back pain and neck  pain.  Skin:  Positive for rash. Negative for color change and wound.  Neurological:  Negative for dizziness, syncope, light-headedness and headaches.  Psychiatric/Behavioral:  Negative for confusion.    Physical Exam Updated Vital Signs BP 108/83   Pulse 77   Temp 98.1 F (36.7 C)   Resp 15   SpO2 99%   Physical Exam Vitals and nursing note reviewed. Exam conducted with a chaperone present (Female RN present as chaperone).  Constitutional:      General: She is not in acute distress.    Appearance: She is not ill-appearing, toxic-appearing or diaphoretic.  HENT:     Head: Normocephalic.  Eyes:     General: No scleral icterus.       Right eye: No discharge.        Left eye: No discharge.  Cardiovascular:     Rate and Rhythm: Normal rate.  Pulmonary:     Effort: Pulmonary effort is normal.  Abdominal:     General: Abdomen is flat. There is no distension. There are no signs of injury.     Palpations: Abdomen is soft. There is no mass or pulsatile mass.     Tenderness: There is no guarding or rebound.     Hernia: There is no hernia in the left inguinal area or right inguinal area.  Genitourinary:    Exam position: Lithotomy position.     Pubic Area: No rash or pubic lice.      Tanner stage (genital): 5.     Labia:        Right: No rash, tenderness, lesion or injury.        Left: No rash, tenderness, lesion or injury.      Urethra: No prolapse.     Vagina: No signs of injury and foreign body. Vaginal discharge present. No erythema, tenderness, bleeding, lesions or prolapsed vaginal walls.     Cervix: No cervical motion tenderness, discharge, friability, lesion, erythema, cervical bleeding or eversion.     Uterus: Not enlarged and not tender.      Adnexa: Right adnexa normal and left adnexa normal.     Comments: Scant white discharge noted to vaginal vault Lymphadenopathy:     Lower Body: No right inguinal adenopathy. No left inguinal adenopathy.  Skin:    General: Skin is  warm and dry.     Findings: No erythema or petechiae. Rash is not crusting, macular, nodular, papular, purpuric, pustular, scaling, urticarial or vesicular.     Comments: Small 1 mm wound to left AC, no surrounding erythema, induration, or purulent discharge.  She has hematoma to right lower extremity, tender to palpation.  No  surrounding erythema, induration, or purulent discharge.  Neurological:     General: No focal deficit present.     Mental Status: She is alert.  Psychiatric:        Behavior: Behavior is cooperative.    ED Results / Procedures / Treatments   Labs (all labs ordered are listed, but only abnormal results are displayed) Labs Reviewed  URINALYSIS, ROUTINE W REFLEX MICROSCOPIC - Abnormal; Notable for the following components:      Result Value   APPearance HAZY (*)    Hgb urine dipstick MODERATE (*)    Bacteria, UA RARE (*)    All other components within normal limits  CBC WITH DIFFERENTIAL/PLATELET - Abnormal; Notable for the following components:   Platelets 427 (*)    All other components within normal limits  BASIC METABOLIC PANEL - Abnormal; Notable for the following components:   Glucose, Bld 147 (*)    All other components within normal limits  WET PREP, GENITAL  HIV ANTIBODY (ROUTINE TESTING W REFLEX)  RPR  I-STAT BETA HCG BLOOD, ED ([facility], [facility], [facility] ONLY)  GC/CHLAMYDIA PROBE AMP ([place]) NOT AT [facility]    EKG None  Radiology No results found.  Procedures Procedures   Medications Ordered in ED Medications  cefTRIAXone (ROCEPHIN) injection 500 mg (500 mg Intramuscular Patient Refused/Not Given 11/15/21 2113)  lidocaine (PF) (XYLOCAINE) 1 % injection 1 mL (1 mL Other Patient Refused/Not Given 11/15/21 2113)  azithromycin (ZITHROMAX) tablet 1,000 mg (1,000 mg Oral Patient Refused/Not Given 11/15/21 2113)    ED Course  I have reviewed the triage vital signs and the nursing notes.  Pertinent labs & imaging results that were available during my care  of the patient were reviewed by me and considered in my medical decision making (see chart for details).    MDM Rules/Calculators/A&P                           Alert 45 year old female no acute stress, nontoxic-appearing.  Presents emergency department with chief complaint of vaginal pain and rash.  Patient has a small 1 mm wound to left AC.  No surrounding erythema, induration, or purulent discharge.  Low suspicion for infectious rash at this time.  Patient has hematoma to right lower extremity, no rash noted.  We will give patient information to follow-up with dermatology if rash worsens.  Due to complaints of vaginal pain patient tested for HIV, syphilis, gonorrhea chlamydia, and wet prep obtained.  Patient elects for empiric treatment of gonorrhea and chlamydia at this time.  Has allergy to doxycycline.  We will treat patient with ceftriaxone and azithromycin.  Urinalysis shows bacteria rare, WBC 0-5, leukocytes negative, nitrite negative.  Patient has no urinary symptoms.  Low suspicion for UTI at this time.  Informed by RN that patient left prior to receiving her ceftriaxone and azithromycin.  Patient's nurse informed patient that she would need to return to the emergency department, urgent care, or health department if positive for gonorrhea and chlamydia to receive treatment.  Discussed results, findings, treatment and follow up. Patient advised of return precautions. Patient verbalized understanding and agreed with plan.   Final Clinical Impression(s) / ED Diagnoses Final diagnoses:  Vaginal pain  Rash    Rx / DC Orders ED Discharge Orders     None        Dyann Ruddle 11/15/21 2125    Godfrey Pick, MD 11/16/21 [phone]

## 2021-11-15 NOTE — ED Triage Notes (Signed)
Pt states, "my clitoris has been burning" x 2 weeks.  Denies urinary complaints.  Denies itching and discharge.

## 2021-11-15 NOTE — ED Provider Notes (Signed)
Emergency Medicine Provider Triage Evaluation Note  Tanya Kirk , a 45 y.o. female  was evaluated in triage.  Pt complains of her clitoris burning for 2 weeks.  She states that this has been intermittent.  She denies any vaginal discharge.  No fevers. She also notes that she has a "spider bite" on her left arm.  She denies any M pox contacts.  No fevers.  Review of Systems  Positive: Clitoris burning Negative: dysuria  Physical Exam  BP 108/83   Pulse 77   Temp 98.1 F (36.7 C)   Resp 15   SpO2 99%  Gen:   Awake, no distress   Resp:  Normal effort  MSK:   Moves extremities without difficulty  Other:  Small sub cm wound on left AC fossa with out surrounding abnormal erythema.   Medical Decision Making  Medically screening exam initiated at 6:39 PM.  Appropriate orders placed.  DELORISE HUNKELE was informed that the remainder of the evaluation will be completed by another provider, this initial triage assessment does not replace that evaluation, and the importance of remaining in the ED until their evaluation is complete.  Note: Portions of this report may have been transcribed using voice recognition software. Every effort was made to ensure accuracy; however, inadvertent computerized transcription errors may be present    Ollen Gross 11/15/21 1907    Truddie Hidden, MD 11/15/21 1950

## 2021-11-15 NOTE — Discharge Instructions (Addendum)
You came to the emergency department today to be evaluated for your vaginal pain and rash.  Your physical exam was reassuring.  You were negative for bacterial vaginosis, yeast infection, and trichomonas.  Your pregnancy test was negative  Today you have been treated for gonorrhea and chlamydia.  The test to determine if you have these will take a few days. They will only call you if your tests come back positive, no news is good news. In the result that your tests are positive you have already been treated.  You also have tests pending for HIV and syphilis.  You should receive a call if these results are positive.  On your discharge information there is also instructions on how to sign up for my chart.  Please complete this so that you can follow the results on your own also.  If you vomit in the next 4 hours you will need the antibiotic pills again.  Please set up an appointment with the health department if this is needed.    Please do not have any sexual activity for the next two weeks.  After that please make sure that you always use a condom every time you have sex.  If you have any new or concerning symptoms please seek additional medical care and evaluation.    If your rash does not improve or worsens please follow-up with dermatologist listed below. [facility]: [facility]  3.2 [phone])  Dermatologist  [place] [address] [place]  [phone]   Dr. Michelene Gardener, MD  2.6 [phone])  Dermatologist  [place]  [phone]  [facility]  3.5 (3)  [facility]  [place]  [phone]   [place]  4.0 (4)  Dermatologist  [place]  [phone]  Lavonna Monarch MD  3.0 (2)  Dermatologist  [place]  [phone]  Katrina Stack  2.7 (6)  Dermatologist  [place]  [phone]  [place] Amy Y MD  2.0 (1)  Dermatologist  [place]  [phone]  [place]  5.0 (3)  Doctor  [address]  [phone]

## 2021-11-16 LAB — GC/CHLAMYDIA PROBE AMP (~~LOC~~) NOT AT ARMC
Chlamydia: NEGATIVE
Comment: NEGATIVE
Comment: NORMAL
Neisseria Gonorrhea: NEGATIVE

## 2021-11-16 LAB — RPR: RPR Ser Ql: NONREACTIVE

## 2021-12-18 ENCOUNTER — Other Ambulatory Visit: Payer: Self-pay

## 2021-12-18 ENCOUNTER — Encounter (HOSPITAL_BASED_OUTPATIENT_CLINIC_OR_DEPARTMENT_OTHER): Payer: Self-pay | Admitting: *Deleted

## 2021-12-18 ENCOUNTER — Emergency Department (HOSPITAL_BASED_OUTPATIENT_CLINIC_OR_DEPARTMENT_OTHER)
Admission: EM | Admit: 2021-12-18 | Discharge: 2021-12-18 | Disposition: A | Discharge status: Home / Self Care | Payer: No Typology Code available for payment source | Attending: Emergency Medicine | Admitting: Emergency Medicine

## 2021-12-18 DIAGNOSIS — R21 Rash and other nonspecific skin eruption: Secondary | ICD-10-CM | POA: Insufficient documentation

## 2021-12-18 MED ORDER — CLOTRIMAZOLE 1 % EX CREA: TOPICAL_CREAM | CUTANEOUS | 0 refills | Status: AC

## 2021-12-18 NOTE — ED Provider Notes (Signed)
[facility] EMERGENCY DEPARTMENT Provider Note   CSN: [id number] Arrival date & time: 12/18/21  1342    History  Chief Complaint  Patient presents with   Rash    Tanya Kirk is a 46 y.o. female with no significant past medical history here for evaluation of rash. Seen previous and told was a "pimple." Rash is diffuse in nature. Pruritis. Not painful. No drainage, warmth. No hx of IVDU. No rash to mucous membranes. No daily medications, recent abd use. No travel sick contacts for house mates with sx symptoms. Does clean hotels for a living. No known bug bites.  HPI     Home Medications Prior to Admission medications   Medication Sig Start Date End Date Taking? Authorizing Provider  clotrimazole (LOTRIMIN) 1 % cream Apply to affected area 2 times daily 12/18/21  Yes Charisa Twitty A, PA-C  ibuprofen (ADVIL,MOTRIN) 600 MG tablet Take 1 tablet (600 mg total) by mouth every 6 (six) hours as needed. Patient not taking: Reported on 04/21/2020 12/03/18   Blanchie Dessert, MD  Ibuprofen-Diphenhydramine Cit (ADVIL PM PO) Take 1 tablet by mouth at bedtime as needed (pain).     [provider]  loperamide (IMODIUM) 2 MG capsule Take 1 capsule (2 mg total) by mouth 4 (four) times daily as needed for diarrhea or loose stools. 05/31/21   Mesner, Corene Cornea, MD  loratadine (CLARITIN) 10 MG tablet Take 10 mg by mouth every other day.     [provider]      Allergies    Tetracyclines & related and Other    Review of Systems   Review of Systems  Constitutional: Negative.   HENT: Negative.    Respiratory: Negative.    Cardiovascular: Negative.   Gastrointestinal: Negative.   Genitourinary: Negative.   Musculoskeletal: Negative.   Skin:  Positive for rash.  Neurological: Negative.   All other systems reviewed and are negative.  Physical Exam Updated Vital Signs BP (!) 135/94 (BP Location: Right Arm)    Pulse 63    Temp 97.9 F (36.6 C) (Oral)    Resp 18    Ht  5' (1.524 m)    Wt 79.8 kg    SpO2 100%    BMI 34.37 kg/m  Physical Exam Vitals and nursing note reviewed.  Constitutional:      General: She is not in acute distress.    Appearance: She is well-developed. She is not ill-appearing.  HENT:     Head: Atraumatic.     Comments: No scalp lesions Eyes:     Pupils: Pupils are equal, round, and reactive to light.  Cardiovascular:     Rate and Rhythm: Normal rate.  Pulmonary:     Effort: No respiratory distress.  Abdominal:     General: There is no distension.  Musculoskeletal:        General: Normal range of motion.     Cervical back: Normal range of motion.  Skin:    General: Skin is warm and dry.     Capillary Refill: Capillary refill takes less than 2 seconds.     Findings: Rash present.     Comments: Erythematous circular papules with central clearing to anterior/posterior trunk as well as left arm. No testicles, bulla, desquamated skin, fluctuance, induration, warmth.  No rash to mucous membranes.  Neurological:     General: No focal deficit present.     Mental Status: She is alert.  Psychiatric:        Mood  and Affect: Mood normal.    ED Results / Procedures / Treatments   Labs (all labs ordered are listed, but only abnormal results are displayed) Labs Reviewed - No data to display  EKG None  Radiology No results found.  Procedures Procedures    Medications Ordered in ED Medications - No data to display  ED Course/ Medical Decision Making/ A&P    46 year old here for evaluation of rash over the last few weeks.  She is afebrile, nonseptic, not ill-appearing.  No systemic symptoms.  Does not take any meds at home, no recent abx, travel.  Rash consistent with ringworm. Patient denies any difficulty breathing or swallowing.  Pt has a patent airway without stridor and is handling secretions without difficulty; no angioedema. No blisters, no pustules, no warmth, no draining sinus tracts, no superficial abscesses, no  bullous impetigo, no vesicles, no desquamation, no target lesions with dusky purpura or a central bulla. Not tender to touch. No concern for superimposed infection. No concern for SJS, TEN, TSS, tick borne illness, syphilis or other life-threatening condition.   The patient has been appropriately medically screened and/or stabilized in the ED. I have low suspicion for any other emergent medical condition which would require further screening, evaluation or treatment in the ED or require inpatient management.  Patient is hemodynamically stable and in no acute distress.  Patient able to ambulate in department prior to ED.  Evaluation does not show acute pathology that would require ongoing or additional emergent interventions while in the emergency department or further inpatient treatment.  I have discussed the diagnosis with the patient and answered all questions.  Pain is been managed while in the emergency department and patient has no further complaints prior to discharge.  Patient is comfortable with plan discussed in room and is stable for discharge at this time.  I have discussed strict return precautions for returning to the emergency department.  Patient was encouraged to follow-up with PCP/specialist refer to at discharge.                           Medical Decision Making          Final Clinical Impression(s) / ED Diagnoses Final diagnoses:  Rash    Rx / DC Orders ED Discharge Orders          Ordered    clotrimazole (LOTRIMIN) 1 % cream        12/18/21 1539              Demorris Choyce A, PA-C 12/18/21 1542    Blanchie Dessert, MD 12/19/21 [phone]

## 2021-12-18 NOTE — Discharge Instructions (Addendum)
Uses lotion twice daily over the areas of the rash.  This rash will take weeks to completely resolve.  Continue using a lotion.  Wash her close, bed sheets and warm water.

## 2021-12-18 NOTE — ED Triage Notes (Signed)
C/o rash x " weeks"

## 2022-11-26 ENCOUNTER — Other Ambulatory Visit: Payer: Self-pay

## 2022-11-26 ENCOUNTER — Encounter (HOSPITAL_COMMUNITY): Payer: Self-pay

## 2022-11-26 ENCOUNTER — Emergency Department (HOSPITAL_COMMUNITY)
Admission: EM | Admit: 2022-11-26 | Discharge: 2022-11-26 | Disposition: A | Discharge status: Home / Self Care | Payer: No Typology Code available for payment source | Attending: Emergency Medicine | Admitting: Emergency Medicine

## 2022-11-26 DIAGNOSIS — N898 Other specified noninflammatory disorders of vagina: Secondary | ICD-10-CM | POA: Diagnosis not present

## 2022-11-26 DIAGNOSIS — K0889 Other specified disorders of teeth and supporting structures: Secondary | ICD-10-CM | POA: Diagnosis not present

## 2022-11-26 DIAGNOSIS — K029 Dental caries, unspecified: Secondary | ICD-10-CM | POA: Insufficient documentation

## 2022-11-26 MED ORDER — METRONIDAZOLE 500 MG PO TABS: 500.0000 mg | ORAL_TABLET | Freq: Two times a day (BID) | ORAL | 0 refills | Status: DC

## 2022-11-26 MED ORDER — FLUCONAZOLE 150 MG PO TABS
150.0000 mg | ORAL_TABLET | Freq: Every day | ORAL | 0 refills | Status: AC
Start: 2022-11-26 — End: ?

## 2022-11-26 MED ORDER — METRONIDAZOLE 500 MG PO TABS
500.0000 mg | ORAL_TABLET | Freq: Two times a day (BID) | ORAL | 0 refills | Status: AC
Start: 2022-11-26 — End: ?

## 2022-11-26 MED ORDER — FLUCONAZOLE 150 MG PO TABS: 150.0000 mg | ORAL_TABLET | Freq: Every day | ORAL | 0 refills | Status: DC

## 2022-11-26 NOTE — ED Provider Notes (Signed)
  [facility] Emergency Department Provider Note MRN:  [id number]  Arrival date & time: 11/26/22     Chief Complaint   Dental Pain   History of Present Illness   Tanya Kirk is a 46 y.o. year-old female presents to the ED with chief complaint of dental pain and vaginal discharge.  States that she was recently given an antibiotic for her tooth.  States that she has not filled it yet.  She asks if she can also get a prescription for diflucan and thinks she might have BV as well.    History provided by patient.   Review of Systems  Pertinent positive and negative review of systems noted in HPI.    Physical Exam   Vitals:   11/26/22 0014  BP: 114/80  Pulse: 83  Resp: 18  Temp: 98.8 F (37.1 C)  SpO2: 98%    CONSTITUTIONAL:  well-appearing, NAD NEURO:  Alert and oriented x 3, CN 3-12 grossly intact EYES:  eyes equal and reactive ENT/NECK:  Supple, no stridor, poor dentition, multiple chipped teeth and dental caries, no visible abscess, no sign of ludwigs CARDIO:  appears well-perfused  PULM:  No respiratory distress,  GI/GU:  non-distended,  MSK/SPINE:  No gross deformities, no edema, moves all extremities  SKIN:  no rash, atraumatic   *Additional and/or pertinent findings included in MDM below  Diagnostic and Interventional Summary    EKG Interpretation  Date/Time:    Ventricular Rate:    PR Interval:    QRS Duration:   QT Interval:    QTC Calculation:   R Axis:     Text Interpretation:         Labs Reviewed - No data to display  No orders to display    Medications - No data to display   Procedures  /  Critical Care Procedures  ED Course and Medical Decision Making  I have reviewed the triage vital signs, the nursing notes, and pertinent available records from the EMR.  Social Determinants Affecting Complexity of Care: Patient has no clinically significant social determinants affecting this chief complaint..   ED  Course:    Medical Decision Making Risk Prescription drug management.     Consultants: No consultations were needed in caring for this patient.   Treatment and Plan: Emergency department workup does not suggest an emergent condition requiring admission or immediate intervention beyond  what has been performed at this time. The patient is safe for discharge and has  been instructed to return immediately for worsening symptoms, change in  symptoms or any other concerns    Final Clinical Impressions(s) / ED Diagnoses     ICD-10-CM   1. Pain, dental  K08.89     2. Vaginal discharge  N89.8       ED Discharge Orders          Ordered    metroNIDAZOLE (FLAGYL) 500 MG tablet  2 times daily        11/26/22 0031    fluconazole (DIFLUCAN) 150 MG tablet  Daily        11/26/22 0031              Discharge Instructions Discussed with and Provided to Patient:   Discharge Instructions   None      Montine Circle, PA-C 11/26/22 0040    Mesner, Corene Cornea, MD 11/26/22 [phone]

## 2022-11-26 NOTE — ED Triage Notes (Signed)
Pt c/o pain in mouth, states she has an appointment soon for same.

## 2023-02-23 ENCOUNTER — Ambulatory Visit: Payer: Medicaid Other | Admitting: Podiatry

## 2023-03-02 ENCOUNTER — Ambulatory Visit: Payer: Medicaid Other | Admitting: Podiatry

## 2023-03-09 ENCOUNTER — Ambulatory Visit (INDEPENDENT_AMBULATORY_CARE_PROVIDER_SITE_OTHER): Payer: Medicaid Other | Admitting: Podiatry

## 2023-03-09 DIAGNOSIS — L600 Ingrowing nail: Secondary | ICD-10-CM | POA: Diagnosis not present

## 2023-03-09 NOTE — Patient Instructions (Signed)

## 2023-03-09 NOTE — Progress Notes (Signed)
   Chief Complaint  Patient presents with   Nail Problem    Patient came in today with right hallux nail pain, nail is thick and yellow, patient states the nail has been hurting more in the last 2 months     Subjective: Patient presents today for evaluation of pain to the medial lateral border of the right great toe with thickening and discoloration of the nail plate times several years. Patient is concerned for possible ingrown nail.  It is very sensitive to touch.  Patient states that she has had procedures on the toenail in the past.  Patient presents today for further treatment and evaluation.  Past Medical History:  Diagnosis Date   Bulimia    Depression     Objective:  General: Well developed, nourished, in no acute distress, alert and oriented x3   Dermatology: Skin is warm, dry and supple bilateral.  Medial and lateral border of the right great toe is tender with evidence of an ingrowing nail. Pain on palpation noted to the border of the nail fold.  Thickening of the nail plate with discoloration also noted the remaining nails appear unremarkable at this time. There are no open sores, lesions.  Vascular: DP and PT pulses palpable.  No clinical evidence of vascular compromise  Neruologic: Grossly intact via light touch bilateral.  Musculoskeletal: No pedal deformity noted  Assesement: #1 Paronychia with ingrowing nail right great toe  -Patient evaluated -The patient has had multiple nail procedures to the toenail before with a history of trauma.  For now recommend total temporary nail avulsion of the right hallux nail plate to allow the new nail to grow in possibly with healthy regrowth.  Explained the procedure in detail to the patient as well as the post care instructions.  She understands that there is no guarantees as far as how the nail regrows.  She understands and would like to proceed -Digital block performed using 3 mL of 2% lidocaine plain and the toe was prepped in  aseptic manner -The nail was avulsed in its entirety.  Dressings applied with post care instructions also provided -Recommend OTC topical antifungal when the new nail begins to grow in.  Apply daily -Return to clinic as needed  Edrick Kins, DPM [facility]  Dr. Edrick Kins, DPM    [address], [place] [address]                Office [phone]  Fax [phone]

## 2023-03-21 IMAGING — US US BREAST*R* LIMITED INC AXILLA
1 series · 4 of 4 positions shown · non-contrast
Comparison: Previous exam(s).

CLINICAL DATA: The patient was called back from screening
mammography due to a right breast mass located in the slightly upper
inner right breast.

EXAM:
DIGITAL DIAGNOSTIC UNILATERAL RIGHT MAMMOGRAM WITH TOMOSYNTHESIS AND
CAD; ULTRASOUND RIGHT BREAST LIMITED
TECHNIQUE: Right digital diagnostic mammography and breast tomosynthesis was
performed. The images were evaluated with computer-aided detection.;
Targeted ultrasound examination of the right breast was performed

[Series 1: us breast*right* limited inc axilla · 0.07mm/px · 4 of 4 slices shown]
[im 1/4]
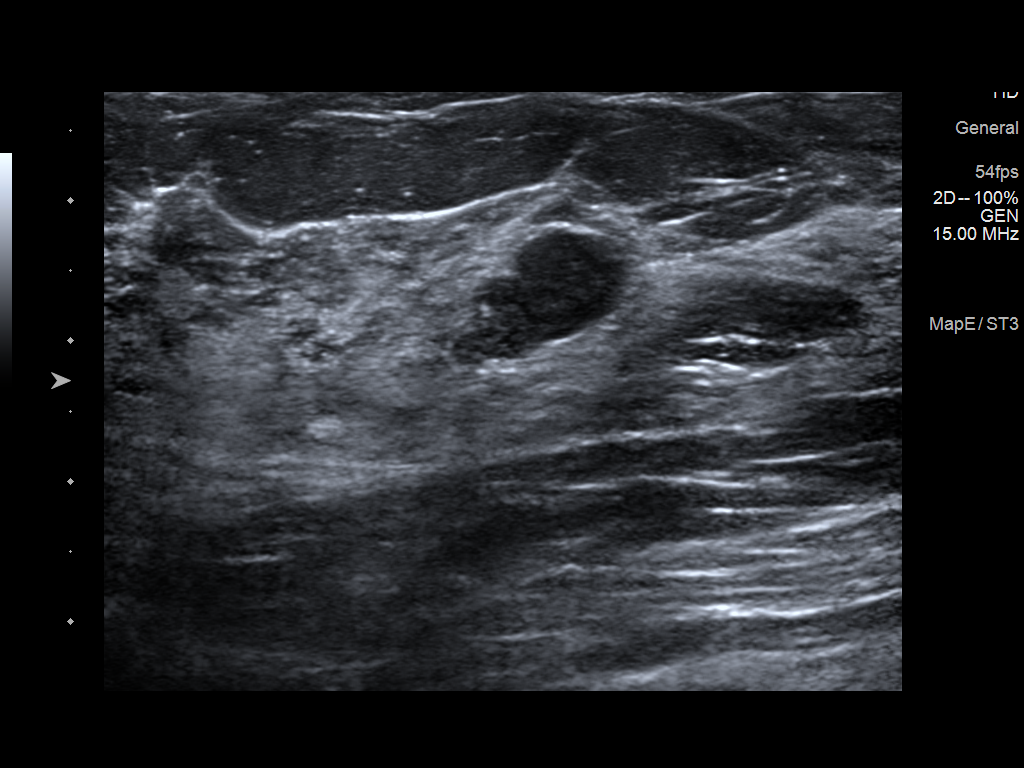
[im 2/4]
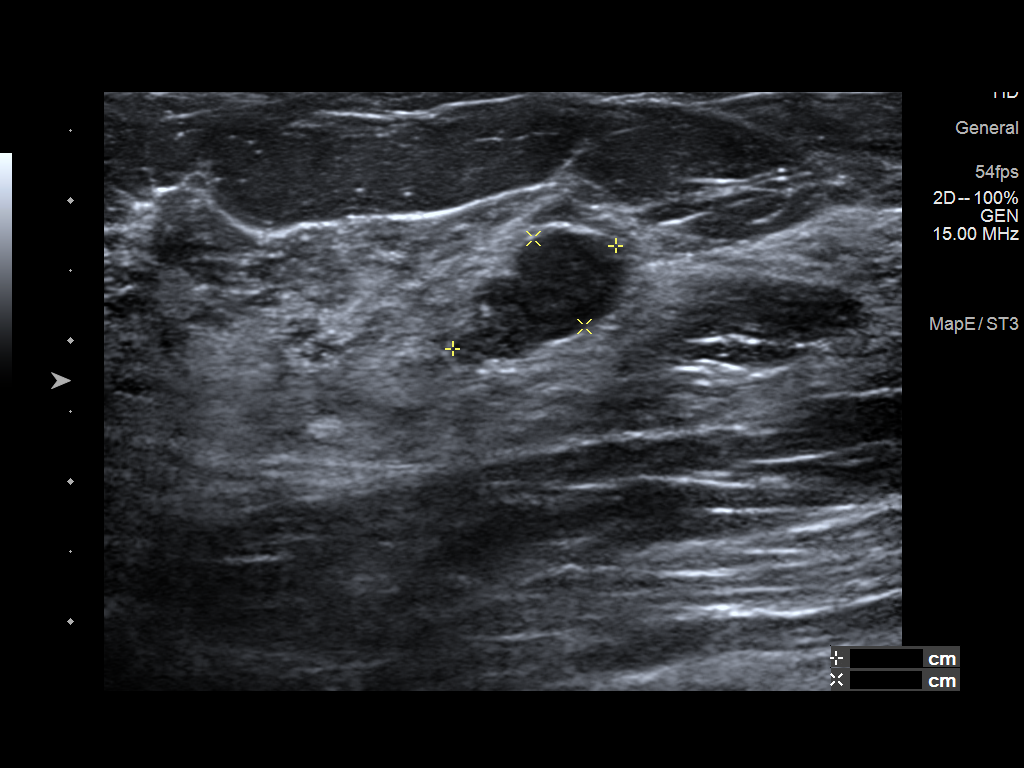
[im 3/4]
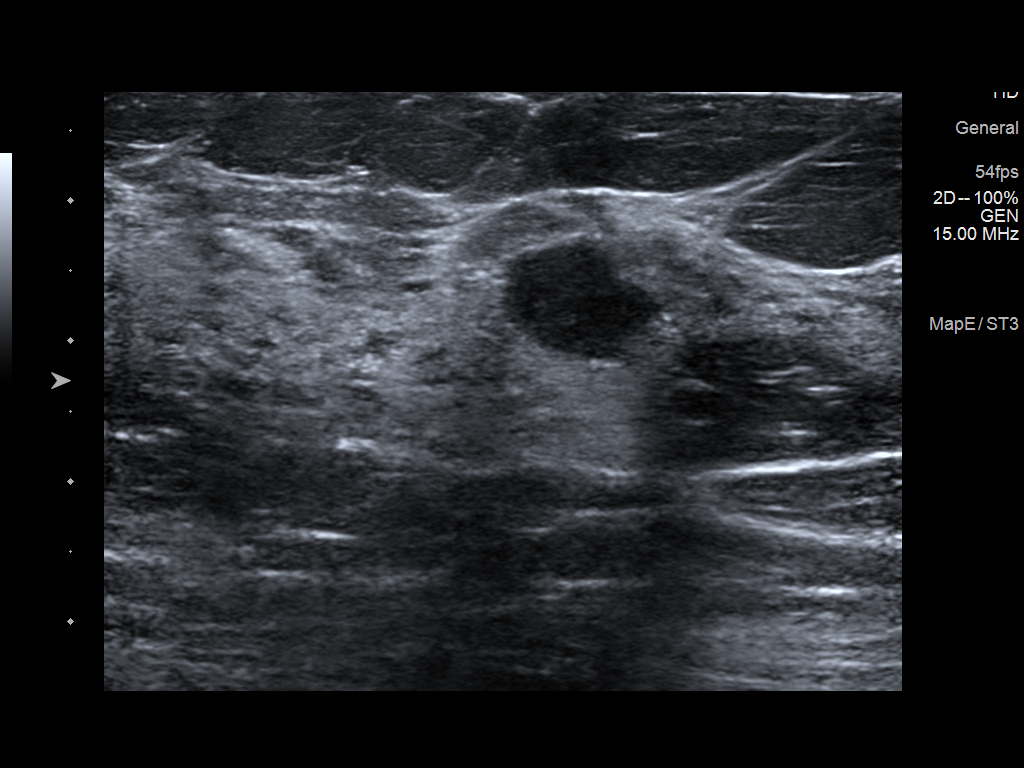
[im 4/4]
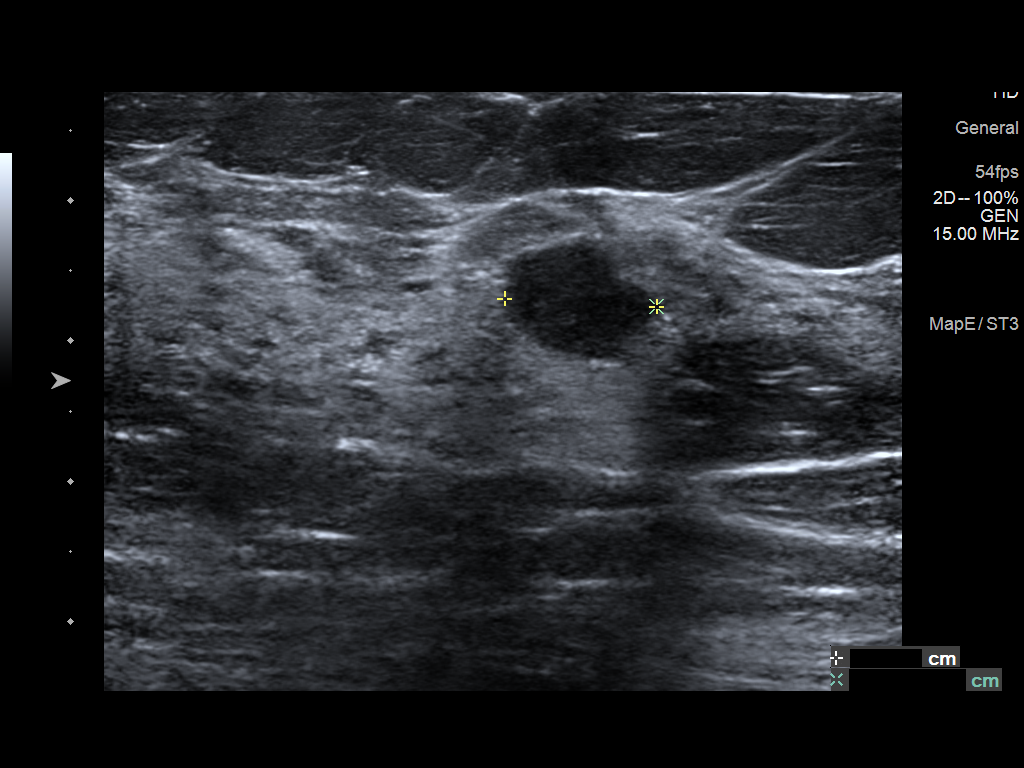

[4 of 4 positions shown; findings below may reference images not displayed]

ACR Breast Density Category c: The breast tissue is heterogeneously
dense, which may obscure small masses.
FINDINGS: The mass in the upper inner right breast appears to have resolved in
the interval.

On physical exam, no suspicious lumps are identified.

Targeted ultrasound is performed, showing no mass in the upper inner
right breast. At 12 o'clock, 4 cm from the nipple is an incidentally
identified mass measuring 1.4 x 0.7 by 1.1 cm.
IMPRESSION: Incidentally identified probably benign right breast mass at 12
o'clock, 4 cm from the nipple.

RECOMMENDATION:
Recommend six-month follow-up ultrasound of the probably benign
right breast mass.

I have discussed the findings and recommendations with the patient.
If applicable, a reminder letter will be sent to the patient
regarding the next appointment.

BI-RADS CATEGORY  3: Probably benign.

## 2023-06-14 ENCOUNTER — Emergency Department (HOSPITAL_COMMUNITY)
Admission: EM | Admit: 2023-06-14 | Discharge: 2023-06-14 | Disposition: A | Discharge status: Home / Self Care | Payer: Medicaid Other | Source: Home / Self Care | Attending: Emergency Medicine | Admitting: Emergency Medicine

## 2023-06-14 ENCOUNTER — Encounter (HOSPITAL_COMMUNITY): Payer: Self-pay

## 2023-06-14 DIAGNOSIS — S30861A Insect bite (nonvenomous) of abdominal wall, initial encounter: Secondary | ICD-10-CM | POA: Diagnosis not present

## 2023-06-14 DIAGNOSIS — S20462A Insect bite (nonvenomous) of left back wall of thorax, initial encounter: Secondary | ICD-10-CM | POA: Insufficient documentation

## 2023-06-14 DIAGNOSIS — S30860A Insect bite (nonvenomous) of lower back and pelvis, initial encounter: Secondary | ICD-10-CM | POA: Diagnosis not present

## 2023-06-14 DIAGNOSIS — W57XXXA Bitten or stung by nonvenomous insect and other nonvenomous arthropods, initial encounter: Secondary | ICD-10-CM | POA: Diagnosis not present

## 2023-06-14 MED ORDER — HYDROXYZINE HCL 25 MG PO TABS: 25.0000 mg | ORAL_TABLET | Freq: Four times a day (QID) | ORAL | 0 refills | Status: AC

## 2023-06-14 NOTE — ED Notes (Addendum)
Pt alert, NAD, calm, interactive, resps e/u, speaking clearly. Ambulatory from w/r. Steady gait. Endorses pin prick and itching sensations. States, "Thinks it is related to spider or other insect". Denies fever, joint pain, cramping, NV, or insect visualization.

## 2023-06-14 NOTE — ED Triage Notes (Signed)
Pt is saying she has allegedly been bit by a spider in her bed, states she has been bit around 5 times. There is some red welts present that are on her left flank and extends on to the abdomen in no clear pattern. Welts are no open and or draining, no streaking or swelling. No other complaints at this time.

## 2023-06-14 NOTE — ED Provider Notes (Signed)
[place] EMERGENCY DEPARTMENT AT [facility] Provider Note   CSN: [id number] Arrival date & time: 06/14/23  1520     History  Chief Complaint  Patient presents with   Insect Bite    Tanya Kirk is a 47 y.o. female.  47 year old female with prior medical history as detailed below presents for evaluation.  Patient reports insect bites to her left abdomen and left low back.  She thinks that this was a spider.  The incident occurred 4 days ago.  The areas are minimally itchy.  She denies fever.  She denies significant pain.   She is presenting today requesting a "medication prescription to suck the poison out".    The history is provided by the patient and medical records.       Home Medications Prior to Admission medications   Medication Sig Start Date End Date Taking? Authorizing Provider  clotrimazole (LOTRIMIN) 1 % cream Apply to affected area 2 times daily 12/18/21   Henderly, Britni A, PA-C  fluconazole (DIFLUCAN) 150 MG tablet Take 1 tablet (150 mg total) by mouth daily. 11/26/22   Roxy Horseman, PA-C  ibuprofen (ADVIL,MOTRIN) 600 MG tablet Take 1 tablet (600 mg total) by mouth every 6 (six) hours as needed. Patient not taking: Reported on 04/21/2020 12/03/18   Gwyneth Sprout, MD  Ibuprofen-Diphenhydramine Cit (ADVIL PM PO) Take 1 tablet by mouth at bedtime as needed (pain).     [provider]  loperamide (IMODIUM) 2 MG capsule Take 1 capsule (2 mg total) by mouth 4 (four) times daily as needed for diarrhea or loose stools. 05/31/21   Mesner, Barbara Cower, MD  loratadine (CLARITIN) 10 MG tablet Take 10 mg by mouth every other day.     [provider]  metroNIDAZOLE (FLAGYL) 500 MG tablet Take 1 tablet (500 mg total) by mouth 2 (two) times daily. 11/26/22   Roxy Horseman, PA-C      Allergies    Tetracyclines & related and Other    Review of Systems   Review of Systems  All other systems reviewed and are negative.   Physical  Exam Updated Vital Signs BP 110/76 (BP Location: Right Arm)   Pulse 73   Temp 98.6 F (37 C) (Oral)   Resp 18   SpO2 100%  Physical Exam Vitals and nursing note reviewed.  Constitutional:      General: She is not in acute distress.    Appearance: Normal appearance. She is well-developed.  HENT:     Head: Normocephalic and atraumatic.  Eyes:     Conjunctiva/sclera: Conjunctivae normal.     Pupils: Pupils are equal, round, and reactive to light.  Cardiovascular:     Rate and Rhythm: Normal rate and regular rhythm.     Heart sounds: Normal heart sounds.  Pulmonary:     Effort: Pulmonary effort is normal. No respiratory distress.     Breath sounds: Normal breath sounds.  Abdominal:     General: There is no distension.     Palpations: Abdomen is soft.     Tenderness: There is no abdominal tenderness.  Musculoskeletal:        General: No deformity. Normal range of motion.     Cervical back: Normal range of motion and neck supple.  Skin:    General: Skin is warm and dry.     Comments: Several -5-7 -spots scattered across the patient's left abdomen and left low back consistent with likely insect bite.  No significant surrounding erythema  or rash.  No evidence of cellulitis.  No fluctuance.  No concern for deep tissue infection.  Neurological:     General: No focal deficit present.     Mental Status: She is alert and oriented to person, place, and time.     ED Results / Procedures / Treatments   Labs (all labs ordered are listed, but only abnormal results are displayed) Labs Reviewed - No data to display  EKG None  Radiology No results found.  Procedures Procedures    Medications Ordered in ED Medications - No data to display  ED Course/ Medical Decision Making/ A&P                             Medical Decision Making   Medical Screen Complete  This patient presented to the ED with complaint of insect bite.  This complaint involves an extensive number of  treatment options. The initial differential diagnosis includes, but is not limited to, insect bite  This presentation is: Acute, Self-Limited, Previously Undiagnosed, and Uncertain Prognosis  Patient is presenting with complaint of insect bites to her left low back and left abdomen.  Patient is concerned that this was a spider.  She wants a prescription to "get the poison out".  Exam is most consistent with mild insect bites with resulting histamine reaction.  No evidence of cellulitis.  Patient does not need antibiotics or additional emergent workup.  Patient is reassured that her symptoms will resolve on their own.  She is advised to use Tylenol for pain.  She is advised to use hydrocortisone cream for itching.  She is advised to use Atarax for additional itching if the hydrocortisone cream is not successful in controlling her symptoms.  Importance of close follow-up stressed.  Strict return precautions given understood. Additional history obtained: External records from outside sources obtained and reviewed including prior ED visits and prior Inpatient records.    Problem List / ED Course:  Insect bite   Reevaluation:  After the interventions noted above, I reevaluated the patient and found that they have: improved  Disposition:  After consideration of the diagnostic results and the patients response to treatment, I feel that the patent would benefit from close outpatient followup.          Final Clinical Impression(s) / ED Diagnoses Final diagnoses:  Insect bite of thoracic wall, unspecified whether front or back, initial encounter    Rx / DC Orders ED Discharge Orders          Ordered    hydrOXYzine (ATARAX) 25 MG tablet  Every 6 hours        06/14/23 1623              Wynetta Fines, MD 06/14/23 1626

## 2023-06-14 NOTE — Discharge Instructions (Signed)
Return for any problem.  Use Tylenol and hydrocortisone cream as instructed.  Additionally, you may use the prescribed medication for itching if required.

## 2024-05-11 ENCOUNTER — Emergency Department (HOSPITAL_COMMUNITY)
Admission: EM | Admit: 2024-05-11 | Discharge: 2024-05-12 | Discharge status: Against Medical Advice | Attending: Emergency Medicine | Admitting: Emergency Medicine

## 2024-05-11 ENCOUNTER — Encounter (HOSPITAL_COMMUNITY): Payer: Self-pay | Admitting: Emergency Medicine

## 2024-05-11 ENCOUNTER — Other Ambulatory Visit: Payer: Self-pay

## 2024-05-11 DIAGNOSIS — Z202 Contact with and (suspected) exposure to infections with a predominantly sexual mode of transmission: Secondary | ICD-10-CM | POA: Diagnosis present

## 2024-05-11 DIAGNOSIS — H5713 Ocular pain, bilateral: Secondary | ICD-10-CM | POA: Diagnosis not present

## 2024-05-11 DIAGNOSIS — H9203 Otalgia, bilateral: Secondary | ICD-10-CM | POA: Insufficient documentation

## 2024-05-11 DIAGNOSIS — Z5321 Procedure and treatment not carried out due to patient leaving prior to being seen by health care provider: Secondary | ICD-10-CM | POA: Insufficient documentation

## 2024-05-11 LAB — PREGNANCY, URINE: Preg Test, Ur: NEGATIVE

## 2024-05-11 LAB — RAPID HIV SCREEN (HIV 1/2 AB+AG)
HIV 1/2 Antibodies: NONREACTIVE
HIV-1 P24 Antigen - HIV24: NONREACTIVE

## 2024-05-11 NOTE — ED Notes (Signed)
 Pt states she no longer want to wait, leaving the ED.

## 2024-05-11 NOTE — ED Triage Notes (Signed)
 Patient c/o right eye redness and itching, bilateral ear swelling x 3 days.  Patient states she put hair color in her hair 4 days ago and thinks that might be related.  Patient also requesting STD testing.  Patient gives verbal consent for MSE.

## 2024-05-11 NOTE — ED Provider Triage Note (Signed)
 Emergency Medicine Provider Triage Evaluation Note  Tanya Kirk , a 48 y.o. female  was evaluated in triage.  Pt complains of bilateral eye pain R>L for the past three days.  Reports that she has a foreign body sensation in the right eye.  Denies any trauma.  She reports that she did try washing out with some water .  She did do hair dye and perm on her scalp around 6 days ago and was having a lot of itching from that.  She reports that her bilateral eyes also feel itchy.  She is also requesting STD testing.  Does not have any symptoms.  Review of Systems  Positive:  Negative:   Physical Exam  BP 112/85 (BP Location: Right Arm)   Pulse 78   Temp 98.3 F (36.8 C)   Resp 18   Wt 85 kg   SpO2 97%   BMI 36.60 kg/m  Gen:   Awake, no distress   Resp:  Normal effort  MSK:   Moves extremities without difficulty  Other:  Bilateral conjunctiva erythema right greater than left.  Does not appear in any acute distress.  Does have some slight periorbital redness bilaterally as well.  Medical Decision Making  Medically screening exam initiated at 2:57 PM.  Appropriate orders placed.  Tanya Kirk was informed that the remainder of the evaluation will be completed by another provider, this initial triage assessment does not replace that evaluation, and the importance of remaining in the ED until their evaluation is complete.  She will need eye examination   Spence Dux, Kirby Peoples 05/11/24 1458

## 2024-05-12 LAB — RPR: RPR Ser Ql: NONREACTIVE

## 2024-05-25 ENCOUNTER — Emergency Department (HOSPITAL_COMMUNITY)
Admission: EM | Admit: 2024-05-25 | Discharge: 2024-05-25 | Disposition: A | Discharge status: Home / Self Care | Attending: Emergency Medicine | Admitting: Emergency Medicine

## 2024-05-25 ENCOUNTER — Encounter (HOSPITAL_COMMUNITY): Payer: Self-pay

## 2024-05-25 ENCOUNTER — Other Ambulatory Visit: Payer: Self-pay

## 2024-05-25 DIAGNOSIS — L299 Pruritus, unspecified: Secondary | ICD-10-CM | POA: Diagnosis present

## 2024-05-25 DIAGNOSIS — R238 Other skin changes: Secondary | ICD-10-CM | POA: Insufficient documentation

## 2024-05-25 DIAGNOSIS — L739 Follicular disorder, unspecified: Secondary | ICD-10-CM | POA: Insufficient documentation

## 2024-05-25 MED ORDER — HYDROXYZINE HCL 25 MG PO TABS
25.0000 mg | ORAL_TABLET | Freq: Once | ORAL | Status: AC
  Administered 2024-05-25: 25 mg via ORAL
  Filled 2024-05-25: qty 1

## 2024-05-25 MED ORDER — MUPIROCIN CALCIUM 2 % EX CREA: 1.0000 | TOPICAL_CREAM | Freq: Two times a day (BID) | CUTANEOUS | 0 refills | Status: AC

## 2024-05-25 NOTE — ED Provider Triage Note (Signed)
 Emergency Medicine Provider Triage Evaluation Note  Tanya Kirk , a 47 y.o. female  was evaluated in triage.  Pt complains of bug bite/sore near her left ear, scalp itching/soreness/bumps.  Patient permed her hair several days ago, feels that she may have burned her scalp, she has multiple sore areas on her scalp and reports that her scalp is severely itchy.  She also believes she may have been bitten by something as she has a small area of swelling around her left ear.  No other complaints.  Review of Systems  Positive: As above Negative: As above  Physical Exam  BP 125/86 (BP Location: Left Arm)   Pulse 77   Temp 98.3 F (36.8 C)   Resp 16   SpO2 97%  Gen:   Awake, no distress   Resp:  Normal effort  MSK:   Moves extremities without difficulty  Other:  Scalp is red/flaky, multiple tender areas to palpation, there is a small scabbed area anterior to left ear with some swelling, no active drainage  Medical Decision Making  Medically screening exam initiated at 8:40 PM.  Appropriate orders placed.  Tanya Kirk was informed that the remainder of the evaluation will be completed by another provider, this initial triage assessment does not replace that evaluation, and the importance of remaining in the ED until their evaluation is complete.     Kendrick Pax, [place] 05/25/24 2043

## 2024-05-25 NOTE — ED Notes (Signed)
 PT D/C'D AFTER INSTRUCTIONS REVIEWED. PT VERBALIZED UNDERSTANDING. NAD REPORTED OR NOTED AT THIS TIME.

## 2024-05-25 NOTE — ED Triage Notes (Signed)
 Patient reports insect bite at left outer ear 3 days ago with mild swelling/soreness , she adds dry scalp with scaly and itchy bumps  burnt from hair color/relaxer 1 1/2 weeks ago .

## 2024-05-25 NOTE — Discharge Instructions (Addendum)
 Please avoid scratching your scalp is much as possible.  Please use heavily moisturizing/dandruff shampoos for at least the next 2 weeks. You may apply mupirocin ointment to affected areas of your scalp 3 times a day for 7 days Thank you for allowing us  to take care of you today.  We hope you begin feeling better soon.   To-Do:  Please follow-up with your primary doctor within the next 2-3 days. Please return to the Emergency Department or call 911 if you experience chest pain, shortness of breath, severe pain, severe fever, altered mental status, or have any reason to think that you need emergency medical care.  Thank you again.  Hope you feel better soon.  [facility] Department of Emergency Medicine

## 2024-05-25 NOTE — ED Provider Notes (Signed)
  [facility] AT [facility] Provider Note   CSN: [id number] Arrival date & time: 05/25/24  1941     History Chief Complaint  Patient presents with   Insect bite ( L ear) / Dry daren scalp with bumps    Tanya Kirk is a 48 y.o. female w/ no significant PMHx who presents to the ED for evaluation of scalp pruritus.  Patient states for approximately 3 to 4 days she has had worsening itchy scalp.  She reports a raised bumps that sometimes burst with purulent fluid.  She states she did use a hair relaxing treatment this week as well as dye her hair at home.  She does not believe that these events are related to her scalp itching and dryness.  Patient also reports she has a small area in front of her left ear that was also pimple-like which is healing.  She denies any fevers.     Physical Exam Updated Vital Signs BP 125/86 (BP Location: Left Arm)   Pulse 77   Temp 98.3 F (36.8 C)   Resp 16   SpO2 97%  Physical Exam Vitals reviewed.  Constitutional:      General: She is not in acute distress.    Appearance: She is not ill-appearing.  HENT:     Head: Normocephalic and atraumatic.     Comments: Significantly dry scalp with dandruff.  Patient does have few areas concerning for folliculitis.  No evidence of abscess.  Cardiovascular:     Rate and Rhythm: Normal rate.  Pulmonary:     Effort: Pulmonary effort is normal.   Musculoskeletal:     Comments: Patient moves all extremity spontaneously   Skin:    General: Skin is warm and dry.     Comments: See head exam   Neurological:     Mental Status: She is alert.     ED Results / Procedures / Treatments   Labs (all labs ordered are listed, but only abnormal results are displayed) Labs Reviewed - No data to display  EKG None  Radiology No results found.  Medications Ordered in ED Medications - No data to display  ED Course/ Medical Decision Making/ A&P  Tanya Kirk is a 48 y.o.  female presents as detailed above  Differential ddx: Dandruff, folliculitis, chemical burn, lice,  On arrival, patient afebrile hemodynamically stable no hypoxia or respiratory distress.  Dry scalp and finding consistent with folliculitis.  No evidence of abscess.  No evidence of spider bite. Discussed avoiding scratching her scalp as this can spread to folliculitis.  Discussed different moisturizing shampoos such as dandruff shampoos and selenium sulfide shampoo.  Will prescribe mupirocin  ointment for current areas of folliculitis.  Patient voiced understanding agrees with plan   Patient stable for discharge and outpatient follow-up. Strict return precautions provided. Patient voices understanding and agrees with plan.   Patient seen with supervising physician who agrees with plan.  Final Clinical Impression(s) / ED Diagnoses Final diagnoses:  Dry scalp  Folliculitis    Rx / DC Orders ED Discharge Orders          Ordered    mupirocin  cream (BACTROBAN ) 2 %  2 times daily        05/25/24 2157            Waddell Seats, DO PGY-3 Emergency Medicine    Seats Waddell, DO 05/25/24 7695    [name], MD 05/26/24 [phone]

## 2024-06-05 ENCOUNTER — Ambulatory Visit: Payer: Self-pay

## 2024-06-05 NOTE — Telephone Encounter (Signed)
 FYI Only or Action Required?: FYI only for provider.  Patient was last seen in primary care on unknown. Called Nurse Triage reporting Hair/Scalp Problem. Symptoms began about a month ago. Interventions attempted: Nothing. Symptoms are: gradually worsening.  Triage Disposition: See Physician Within 24 Hours  Patient/caregiver understands and will follow disposition?: Unsure  Reason for Disposition  [1] Swelling is painful to touch AND [2] no fever  Answer Assessment - Initial Assessment Questions 1. APPEARANCE of SWELLING: What does it look like?     1-half size of lemon, most grape size 3. LOCATION: Where is the swelling located?     Head/scalp 4. ONSET: When did the swelling start?     About a month ago 5. COLOR: What color is it? Is there more than one color?     No color 6. PAIN: Is there any pain? If Yes, ask: How bad is the pain? (e.g., scale 1-10; or mild, moderate, severe)     - NONE (0): no pain   - MILD (1-3): doesn't interfere with normal activities    - MODERATE (4-7): interferes with normal activities or awakens from sleep    - SEVERE (8-10): excruciating pain, unable to do any normal activities     Tender as of recently, scalp still itchy 7. ITCH: Does it itch? If Yes, ask: How bad is the itch?      Still itchy 8. CAUSE: What do you think caused the swelling?    Hair treatment I did beginning of June 9 OTHER SYMPTOMS: Do you have any other symptoms? (e.g., fever)     Denies  Pt states that she is homeless and did not get the medication that was prescribed. Pt states that she has been to the ED 2x for this. Pt is wanting US .  Protocols used: Skin Lump or Localized Swelling-A-AH

## 2024-06-11 NOTE — Progress Notes (Signed)
 Subjective  Patient ID: Tanya Kirk  is a 48 y.o. female here in clinic for:  Chief Complaint  Patient presents with  . Establish Care    Chemical burn from perm Colored her hair and vaginal hair four days later Eye concern (Right)      The following information was reviewed by members of the visit team:   Medical History[1]   Surgical History[2]   Family History[3]   Social History   Socioeconomic History  . Marital status: Single    Spouse name: Not on file  . Number of children: Not on file  . Years of education: Not on file  . Highest education level: Not on file  Occupational History  . Not on file  Tobacco Use  . Smoking status: Every Day    Types: Cigarettes  . Smokeless tobacco: Never  Vaping Use  . Vaping status: Never Used  Substance and Sexual Activity  . Alcohol use: Yes  . Drug use: Yes    Types: Cocaine, Narcotics  . Sexual activity: Not on file  Other Topics Concern  . Not on file  Social History Narrative  . Not on file   Social Drivers of Health   Food Insecurity: High Risk (06/11/2024)   Food vital sign   . Within the past 12 months, you worried that your food would run out before you got money to buy more: Often true   . Within the past 12 months, the food you bought just didn't last and you didn't have money to get more: Often true  Transportation Needs: Unmet Transportation Needs (06/11/2024)   Transportation   . In the past 12 months, has lack of reliable transportation kept you from medical appointments, meetings, work or from getting things needed for daily living? : Yes  Safety: High Risk (06/11/2024)   Safety   . How often does anyone, including family and friends, physically hurt you?: Never   . How often does anyone, including family and friends, insult or talk down to you?: Rarely   . How often does anyone, including family and friends, threaten you with harm?: Never   . How often does anyone, including family and friends,  scream or curse at you?: Never  Living Situation: Low Risk  (06/11/2024)   Living Situation   . What is your living situation today?: I have a steady place to live   . Think about the place you live. Do you have problems with any of the following? Choose all that apply:: None/None on this list      Medications Ordered Prior to Encounter[4]   Tetracyclines   Most recent PHQ-2 results: Patient Health Questionnaire-2 Score: 4 (06/11/2024  2:27 PM)  Most recent PHQ-9 result: Patient Health Questionnaire-9 Score: 15 (06/11/2024  2:27 PM)  PHQ-9 Question # 9 Thoughts that you would be better off dead or hurting yourself in some way: Not at all (06/11/2024  2:27 PM)  Interpretation:   PHQ-9 Interpretation: Positive (Moderately Severe Depression Severity) (06/11/2024  2:27 PM)  Depression Plan: Situational stressors reviewed, patient to return to office if symptoms persist or worsen   No results found for this or any previous visit (from the past 12 weeks).   HPI Patient is a 48 year old female with no history of known chronic illnesses who is here today to establish care.  She reports that she had an issue with her skin and lumps that have appeared.   She reports that if she had permed  her hair about 3 weeks ago with her usual care products [Motions], and then states that 4 days later she, she dyed it black her scalp and her pubic hair black; had no issues with the pubic region.  However, she started having issues with the skin of her scalp.  It became very dr and itchy.  She also noted that she started seeing large bumps appear on her scalp.  They did not itch, were not tender, and she says that there was no discharge.  Went to the ER to be seen but stated that she was just given tablets for hydroxyzine  but the symptoms still persisted.  She reports that soon after she started getting similar bumps in her armpit region.  This time, she noted pain and discharge in the area.  There is no surrounding  redness or warmth in the area.  She returned to the ER and was given mupirocin  ointment but this does not seem to be helping.  Wonders if there is something else going on.    Review of Systems  All other systems reviewed and are negative.     Objective  BP 106/75 (BP Location: Left arm, Patient Position: Sitting)   Pulse 79   Ht 1.588 m (5' 2.5)   Wt 74.4 kg (164 lb)   BMI 29.52 kg/m    Physical Exam Vitals and nursing note reviewed.  Constitutional:      Appearance: Normal appearance.  HENT:     Head: Normocephalic and atraumatic.     Nose: Nose normal.     Mouth/Throat:     Mouth: Mucous membranes are moist.   Eyes:     Pupils: Pupils are equal, round, and reactive to light.    Cardiovascular:     Rate and Rhythm: Normal rate and regular rhythm.     Pulses: Normal pulses.     Heart sounds: Normal heart sounds.  Pulmonary:     Effort: Pulmonary effort is normal.     Breath sounds: Normal breath sounds.  Abdominal:     General: Abdomen is flat.     Palpations: Abdomen is soft.     Tenderness: There is no abdominal tenderness.   Musculoskeletal:        General: Normal range of motion.     Cervical back: Neck supple.   Skin:    General: Skin is warm.     Capillary Refill: Capillary refill takes less than 2 seconds.     Comments: There are 2 nodules in both axilla- tender on palpation ; the one on the left appears to have a yellow discharge present.  No surrounding erythema, increased warmth.  There is a non-tender firm, moderate sized mass on the right parietal region of patient's scalp.  Significant hair loss seen especially in the apex of  the scalp. No significant erythema or dryness of scalp present   Neurological:     General: No focal deficit present.     Mental Status: She is alert and oriented to person, place, and time.   Psychiatric:        Mood and Affect: Mood normal.        Behavior: Behavior normal.       Assessment/Plan  Diagnoses and  all orders for this visit:  Encounter to establish care  Folliculitis: of axilla -     Hemoglobin A1C With Estimated Average Glucose; Future -     cephALEXin (KEFLEX) 500 mg capsule; Take 1 capsule (500 mg total) by  mouth 2 (two) times a day for 7 days. -     Hemoglobin A1C With Estimated Average Glucose -     Education given.  RTC in 3 months for follow-up  Scalp lump- ? cyst -     CBC with Differential; Future -     Comprehensive Metabolic Panel; Future -     TSH With Reflex To Free T4; Future -     Antinuclear Antibody, Hep-2 Substrate,IgG; Future -     cefTRIAXone  (ROCEPHIN ) injection 500 mg -     Ambulatory Referral to Dermatology; Future -     CBC with Differential -     Comprehensive Metabolic Panel -     TSH With Reflex To Free T4 -     Antinuclear Antibody, Hep-2 Substrate,IgG -  Hair loss -     Ambulatory Referral to Dermatology; Future  Screening for diabetes mellitus -     Hemoglobin A1C With Estimated Average Glucose; Future -     Hemoglobin A1C With Estimated Average Glucose   Electronically signed by: Elveria Tedi Kaiser, MD 06/11/2024 3:13 PM       [1] History reviewed. No pertinent past medical history. [2] History reviewed. No pertinent surgical history. [3] No family history on file. [4] Current Outpatient Medications on File Prior to Visit  Medication Sig Dispense Refill  . mupirocin  (BACTROBAN ) 2 % ointment APPLY 1 APPLICATION TOPICALLY TO SCALP 2 TO 3 TIMES DAILY FOR 7 DAYS    . hydrOXYzine  (ATARAX ) 25 mg tablet Take 25 mg by mouth. (Patient not taking: Reported on 06/11/2024)    . loratadine (CLARITIN) 10 mg tablet Take 10 mg by mouth. (Patient not taking: Reported on 06/11/2024)     No current facility-administered medications on file prior to visit.

## 2024-06-12 NOTE — Telephone Encounter (Signed)
-----   Message from Elveria Kaiser, MD sent at 06/12/2024 12:03 PM EDT ----- Please let patient know that she is a prediabetic.  She can let me know if she wishes to start medicine for this or if she prefers to use a low carbohydrate diet to prevent progression to diabetes.  Her hemoglobin, liver, kidneys, thyroid and electrolytes were normal. Thanks ----- Message ----- From: Lab, Background User Sent: 06/11/2024   9:07 PM EDT To: Elveria Tedi Kaiser, MD

## 2024-06-12 NOTE — Telephone Encounter (Signed)
 Informed patient of results. Patient voiced, What about my scalp? Two of my cousins passed away and a girlfriend of mine passed away from stage four cancer. Can I look up what carbohydrate diet is and get back to you? I told the patient yes. Paitent voiced, Hey also what about my other test for my scalp? What are those results? I told the patient at this time the test is still processing. Patient voiced, How long does that take? I told the patient it is still processing and I am uncertain how long it takes for the results to come back. Patient voiced, Tanya Kirk I will wait for a call back.

## 2024-06-13 NOTE — Telephone Encounter (Signed)
 Called and is asking for someone to call her about the cancer results.

## 2024-09-26 ENCOUNTER — Ambulatory Visit (HOSPITAL_COMMUNITY)
Admission: EM | Admit: 2024-09-26 | Discharge: 2024-09-26 | Disposition: A | Discharge status: Home / Self Care | Payer: MEDICAID

## 2024-09-26 ENCOUNTER — Encounter (HOSPITAL_COMMUNITY): Payer: Self-pay

## 2024-09-26 DIAGNOSIS — Z7251 High risk heterosexual behavior: Secondary | ICD-10-CM | POA: Diagnosis not present

## 2024-09-26 DIAGNOSIS — Z202 Contact with and (suspected) exposure to infections with a predominantly sexual mode of transmission: Secondary | ICD-10-CM | POA: Diagnosis present

## 2024-09-26 LAB — POCT URINALYSIS DIP (MANUAL ENTRY)
Bilirubin, UA: NEGATIVE
Glucose, UA: NEGATIVE mg/dL
Leukocytes, UA: NEGATIVE
Nitrite, UA: NEGATIVE
Protein Ur, POC: NEGATIVE mg/dL
Spec Grav, UA: >=1.03 — AB (ref 1.010–1.025)
Urobilinogen, UA: 0.2 U/dL
pH, UA: 5.5 (ref 5.0–8.0)

## 2024-09-26 LAB — POCT URINE PREGNANCY: Preg Test, Ur: NEGATIVE

## 2024-09-26 LAB — HIV ANTIBODY (ROUTINE TESTING W REFLEX): HIV Screen 4th Generation wRfx: NONREACTIVE

## 2024-09-26 NOTE — ED Triage Notes (Signed)
 Patient states she perform oral sex on a random person x 1 month. She is concerned about Std and would like testing.  Patient states she has vaginal discharge.

## 2024-09-26 NOTE — ED Provider Notes (Signed)
 [facility]    CSN: [id number] Arrival date & time: 09/26/24  1319      History   Chief Complaint Chief Complaint  Patient presents with   Vaginal Discharge    HPI [name] is a 48 y.o. female.   Patient presents today due to concerns for STIs after having oral sex 1 month ago with a person that she did not know very well.  Patient denies any symptoms of throat pain, lymphadenopathy, or visible exudate in her throat.  Patient just would like to be tested for peace of mind.  The history is provided by the patient.  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 Authorizing Provider  clotrimazole  (LOTRIMIN ) 1 % cream Apply to affected area 2 times daily 12/18/21   Henderly, Britni A, PA-C  fluconazole  (DIFLUCAN ) 150 MG tablet Take 1 tablet (150 mg total) by mouth daily. 11/26/22   Vicky Charleston, PA-C  hydrOXYzine  (ATARAX ) 25 MG tablet Take 1 tablet (25 mg total) by mouth every 6 (six) hours. 06/14/23   Laurice Maude BROCKS, MD  ibuprofen  (ADVIL ,MOTRIN ) 600 MG tablet Take 1 tablet (600 mg total) by mouth every 6 (six) hours as needed. Patient not taking: Reported on 04/21/2020 12/03/18   Doretha Folks, MD  Ibuprofen -Diphenhydramine  Cit (ADVIL  PM PO) Take 1 tablet by mouth at bedtime as needed (pain).      [provider]  loperamide  (IMODIUM ) 2 MG capsule Take 1 capsule (2 mg total) by mouth 4 (four) times daily as needed for diarrhea or loose stools. 05/31/21   Mesner, Jason, MD  loratadine (CLARITIN) 10 MG tablet Take 10 mg by mouth every other day.     [provider]  metroNIDAZOLE  (FLAGYL ) 500 MG tablet Take 1 tablet (500 mg total) by mouth 2 (two) times daily. 11/26/22   Vicky Charleston, PA-C    Family History Family History  Problem Relation Age of Onset   Cancer Mother        gum cancer   Cancer Other    Diabetes Other     Social History Social History   Tobacco Use   Smoking status: Never   Smokeless tobacco: Never  Vaping Use   Vaping status: Never Used  Substance Use Topics   Alcohol use: Yes    Comment: socially   Drug use: Yes    Types: Cocaine, Marijuana    Comment: twice per month, occasional MJ     Allergies   Tetracyclines & related and Other  Review of Systems Review of Systems  Genitourinary:  Positive for vaginal discharge.     Physical Exam Triage Vital Signs ED Triage Vitals [09/26/24 1419]  Encounter Vitals Group     BP 121/81     Girls Systolic BP Percentile      Girls Diastolic BP Percentile      Boys Systolic BP Percentile      Boys Diastolic BP Percentile      Pulse Rate 79     Resp 20     Temp 97.8 F (36.6 C)     Temp Source Oral     SpO2 98 %     Weight      Height      Head Circumference      Peak Flow      Pain Score      Pain Loc      Pain Education      Exclude from Growth Chart    No data found.  Updated Vital Signs BP 121/81 (BP Location: Left Arm)   Pulse 79   Temp 97.8 F (36.6 C) (Oral)   Resp 20   SpO2 98%   Visual Acuity Right Eye Distance:   Left Eye Distance:   Bilateral Distance:    Right Eye Near:   Left Eye Near:    Bilateral Near:     Physical Exam Vitals and nursing note reviewed.  Constitutional:      General: She is not in acute distress.    Appearance: Normal  appearance. She is not ill-appearing, toxic-appearing or diaphoretic.  Eyes:     General: No scleral icterus. Cardiovascular:     Rate and Rhythm: Normal rate and regular rhythm.     Heart sounds: Normal heart sounds.  Pulmonary:     Effort: Pulmonary effort is normal. No respiratory distress.     Breath sounds: Normal breath sounds. No wheezing or rhonchi.  Abdominal:     General: Abdomen is flat. Bowel sounds are normal.     Palpations: Abdomen is soft.     Tenderness: There is no abdominal tenderness. There is no right CVA tenderness or left CVA tenderness.  Skin:    General: Skin is warm.  Neurological:     Mental Status: She is alert and oriented to person, place, and time.  Psychiatric:        Mood and Affect: Mood normal.        Behavior: Behavior normal.      UC Treatments / Results  Labs (all labs ordered are listed, but only abnormal results are displayed) Labs Reviewed  POCT URINALYSIS DIP (MANUAL ENTRY) - Abnormal; Notable for the following components:      Result Value   Ketones, POC UA trace (5) (*)    Spec Grav, UA >=1.030 (*)    Blood, UA moderate (*)    All other components within normal limits  HIV ANTIBODY (ROUTINE TESTING W REFLEX)  RPR  POCT URINE PREGNANCY  CERVICOVAGINAL ANCILLARY ONLY    EKG   Radiology No results found.  Procedures Procedures (including critical care time)  Medications Ordered in UC Medications - No data to display  Initial Impression / Assessment and Plan / UC Course  I have reviewed the triage vital signs and the nursing notes.  Pertinent labs & imaging results that were available during my care of the patient were reviewed by me and considered in my medical decision making (see chart for details).  Patient will be tested for gonorrhea, chlamydia, and trichomoniasis orally and vaginally.  Patient will also be tested for HIV and syphilis.  No medications were prescribed today, we will wait for results. Final  Clinical Impressions(s) / UC Diagnoses   Final diagnoses:  Contact with and (suspected) exposure to infections with a predominantly sexual mode of transmission  High risk heterosexual behavior   Discharge Instructions   None    ED Prescriptions   None    PDMP not reviewed this encounter.   Andra Corean BROCKS, PA-C 09/26/24 1453

## 2024-09-27 LAB — CERVICOVAGINAL ANCILLARY ONLY
Chlamydia: NEGATIVE
Comment: NEGATIVE
Comment: NEGATIVE
Comment: NORMAL
Neisseria Gonorrhea: NEGATIVE
Trichomonas: NEGATIVE

## 2024-09-27 LAB — RPR: RPR Ser Ql: NONREACTIVE
